# Patient Record
Sex: Female | Born: 1935 | Race: White | Hispanic: No | Smoking: Never smoker
Health system: Southern US, Community
[De-identification: ages and names within clinical notes are randomized; demographics above are authoritative.]

## PROBLEM LIST (undated history)

## (undated) DIAGNOSIS — I10 Essential (primary) hypertension: Secondary | ICD-10-CM

## (undated) DIAGNOSIS — I509 Heart failure, unspecified: Secondary | ICD-10-CM

## (undated) DIAGNOSIS — I350 Nonrheumatic aortic (valve) stenosis: Secondary | ICD-10-CM

## (undated) DIAGNOSIS — G629 Polyneuropathy, unspecified: Secondary | ICD-10-CM

## (undated) DIAGNOSIS — M1990 Unspecified osteoarthritis, unspecified site: Secondary | ICD-10-CM

## (undated) DIAGNOSIS — W19XXXA Unspecified fall, initial encounter: Secondary | ICD-10-CM

## (undated) DIAGNOSIS — Y92009 Unspecified place in unspecified non-institutional (private) residence as the place of occurrence of the external cause: Secondary | ICD-10-CM

## (undated) DIAGNOSIS — K219 Gastro-esophageal reflux disease without esophagitis: Secondary | ICD-10-CM

## (undated) DIAGNOSIS — Z87440 Personal history of urinary (tract) infections: Secondary | ICD-10-CM

## (undated) HISTORY — PX: TONSILLECTOMY: SUR1361

## (undated) HISTORY — PX: ABDOMINAL HYSTERECTOMY: SHX81

## (undated) HISTORY — PX: ROTATOR CUFF REPAIR: SHX139

## (undated) HISTORY — PX: EYE SURGERY: SHX253

## (undated) HISTORY — PX: COLON SURGERY: SHX602

## (undated) HISTORY — PX: CATARACT EXTRACTION: SUR2

## (undated) HISTORY — PX: FOOT SURGERY: SHX648

---

## 2004-07-23 ENCOUNTER — Ambulatory Visit: Payer: Self-pay | Admitting: Internal Medicine

## 2005-03-24 ENCOUNTER — Emergency Department: Payer: Self-pay | Admitting: Unknown Physician Specialty

## 2005-03-24 ENCOUNTER — Other Ambulatory Visit: Payer: Self-pay

## 2005-04-01 ENCOUNTER — Inpatient Hospital Stay: Payer: Self-pay | Admitting: Internal Medicine

## 2005-04-01 ENCOUNTER — Other Ambulatory Visit: Payer: Self-pay

## 2005-04-06 ENCOUNTER — Inpatient Hospital Stay: Payer: Self-pay | Admitting: Surgery

## 2005-04-06 ENCOUNTER — Other Ambulatory Visit: Payer: Self-pay

## 2005-04-09 ENCOUNTER — Other Ambulatory Visit: Payer: Self-pay

## 2005-05-28 ENCOUNTER — Ambulatory Visit: Payer: Self-pay | Admitting: Gastroenterology

## 2005-07-24 ENCOUNTER — Ambulatory Visit: Payer: Self-pay | Admitting: Internal Medicine

## 2006-02-19 ENCOUNTER — Ambulatory Visit: Payer: Self-pay | Admitting: Orthopaedic Surgery

## 2006-03-06 ENCOUNTER — Ambulatory Visit: Payer: Self-pay | Admitting: Internal Medicine

## 2006-03-13 ENCOUNTER — Inpatient Hospital Stay: Payer: Self-pay | Admitting: Orthopaedic Surgery

## 2006-11-27 ENCOUNTER — Ambulatory Visit: Payer: Self-pay | Admitting: Internal Medicine

## 2007-06-30 ENCOUNTER — Ambulatory Visit: Payer: Self-pay | Admitting: Gastroenterology

## 2008-01-12 ENCOUNTER — Ambulatory Visit: Payer: Self-pay | Admitting: Internal Medicine

## 2008-11-16 ENCOUNTER — Ambulatory Visit: Payer: Self-pay | Admitting: Cardiology

## 2009-01-31 ENCOUNTER — Ambulatory Visit: Payer: Self-pay | Admitting: Internal Medicine

## 2009-04-16 ENCOUNTER — Ambulatory Visit: Payer: Self-pay | Admitting: Internal Medicine

## 2009-04-21 ENCOUNTER — Inpatient Hospital Stay: Payer: Self-pay | Admitting: Internal Medicine

## 2009-05-17 ENCOUNTER — Ambulatory Visit: Payer: Self-pay | Admitting: Internal Medicine

## 2009-09-18 ENCOUNTER — Ambulatory Visit: Payer: Self-pay | Admitting: Internal Medicine

## 2010-02-20 ENCOUNTER — Ambulatory Visit: Payer: Self-pay | Admitting: Internal Medicine

## 2010-07-23 ENCOUNTER — Emergency Department: Payer: Self-pay | Admitting: Emergency Medicine

## 2012-06-02 LAB — URINALYSIS, COMPLETE
Bilirubin,UR: NEGATIVE
Glucose,UR: NEGATIVE mg/dL (ref 0–75)
Ketone: NEGATIVE
Ph: 6 (ref 4.5–8.0)
RBC,UR: 1 /HPF (ref 0–5)
Specific Gravity: 1.012 (ref 1.003–1.030)
Squamous Epithelial: 1
WBC UR: 4 /HPF (ref 0–5)

## 2012-06-02 LAB — COMPREHENSIVE METABOLIC PANEL
Albumin: 3.9 g/dL (ref 3.4–5.0)
Alkaline Phosphatase: 83 U/L (ref 50–136)
Anion Gap: 11 (ref 7–16)
Bilirubin,Total: 0.3 mg/dL (ref 0.2–1.0)
Calcium, Total: 8.5 mg/dL (ref 8.5–10.1)
Chloride: 105 mmol/L (ref 98–107)
Co2: 24 mmol/L (ref 21–32)
Creatinine: 0.69 mg/dL (ref 0.60–1.30)
EGFR (African American): >60
EGFR (Non-African Amer.): >60
SGOT(AST): 20 U/L (ref 15–37)
SGPT (ALT): 22 U/L (ref 12–78)
Sodium: 140 mmol/L (ref 136–145)

## 2012-06-02 LAB — CBC
HCT: 38.1 % (ref 35.0–47.0)
HGB: 12.5 g/dL (ref 12.0–16.0)
MCH: 24.3 pg — ABNORMAL LOW (ref 26.0–34.0)
MCV: 74 fL — ABNORMAL LOW (ref 80–100)
RBC: 5.15 10*6/uL (ref 3.80–5.20)
RDW: 15.6 % — ABNORMAL HIGH (ref 11.5–14.5)

## 2012-06-02 LAB — CK TOTAL AND CKMB (NOT AT ARMC)
CK, Total: 259 U/L — ABNORMAL HIGH (ref 21–215)
CK-MB: 13.5 ng/mL — ABNORMAL HIGH (ref 0.5–3.6)

## 2012-06-02 LAB — PROTIME-INR: INR: 1.1

## 2012-06-03 ENCOUNTER — Inpatient Hospital Stay: Payer: Self-pay | Admitting: Internal Medicine

## 2012-06-03 LAB — CK TOTAL AND CKMB (NOT AT ARMC)
CK, Total: 332 U/L — ABNORMAL HIGH (ref 21–215)
CK, Total: 309 U/L — ABNORMAL HIGH (ref 21–215)
CK-MB: 8.1 ng/mL — ABNORMAL HIGH (ref 0.5–3.6)
CK-MB: 6.7 ng/mL — ABNORMAL HIGH (ref 0.5–3.6)

## 2012-06-03 LAB — TROPONIN I
Troponin-I: 0.15 ng/mL — ABNORMAL HIGH
Troponin-I: 0.16 ng/mL — ABNORMAL HIGH

## 2012-06-03 LAB — IRON AND TIBC
Iron Bind.Cap.(Total): 380 ug/dL (ref 250–450)
Iron: 68 ug/dL (ref 50–170)

## 2012-06-03 LAB — DIGOXIN LEVEL: Digoxin: <0.1 ng/mL — ABNORMAL LOW

## 2012-06-04 LAB — CBC WITH DIFFERENTIAL/PLATELET
Basophil #: 0.2 10*3/uL — ABNORMAL HIGH (ref 0.0–0.1)
HCT: 37.1 % (ref 35.0–47.0)
HGB: 12 g/dL (ref 12.0–16.0)
Lymphocyte #: 2 10*3/uL (ref 1.0–3.6)
Lymphocyte %: 27.4 %
MCH: 24.4 pg — ABNORMAL LOW (ref 26.0–34.0)
MCHC: 32.4 g/dL (ref 32.0–36.0)
MCV: 75 fL — ABNORMAL LOW (ref 80–100)
Monocyte %: 11.2 %
Platelet: 115 10*3/uL — ABNORMAL LOW (ref 150–440)
RDW: 15.6 % — ABNORMAL HIGH (ref 11.5–14.5)
WBC: 7.4 10*3/uL (ref 3.6–11.0)

## 2012-06-04 LAB — LIPID PANEL
Cholesterol: 162 mg/dL (ref 0–200)
HDL Cholesterol: 29 mg/dL — ABNORMAL LOW (ref 40–60)
Triglycerides: 151 mg/dL (ref 0–200)

## 2012-06-04 LAB — BASIC METABOLIC PANEL
Calcium, Total: 8.7 mg/dL (ref 8.5–10.1)
Chloride: 108 mmol/L — ABNORMAL HIGH (ref 98–107)
Co2: 25 mmol/L (ref 21–32)
Creatinine: 0.75 mg/dL (ref 0.60–1.30)
EGFR (African American): >60
Sodium: 141 mmol/L (ref 136–145)

## 2012-06-04 LAB — MAGNESIUM: Magnesium: 1.9 mg/dL

## 2012-06-07 LAB — URINE CULTURE

## 2012-07-28 ENCOUNTER — Ambulatory Visit: Payer: Self-pay | Admitting: Internal Medicine

## 2012-08-03 ENCOUNTER — Ambulatory Visit: Payer: Self-pay | Admitting: Oncology

## 2012-08-04 ENCOUNTER — Ambulatory Visit: Payer: Self-pay | Admitting: Oncology

## 2012-08-16 ENCOUNTER — Ambulatory Visit: Payer: Self-pay | Admitting: Oncology

## 2013-01-15 ENCOUNTER — Ambulatory Visit: Payer: Self-pay | Admitting: Internal Medicine

## 2014-03-16 DIAGNOSIS — I5023 Acute on chronic systolic (congestive) heart failure: Secondary | ICD-10-CM

## 2014-03-16 DIAGNOSIS — I350 Nonrheumatic aortic (valve) stenosis: Secondary | ICD-10-CM | POA: Insufficient documentation

## 2014-03-16 DIAGNOSIS — I4891 Unspecified atrial fibrillation: Secondary | ICD-10-CM | POA: Insufficient documentation

## 2014-03-16 DIAGNOSIS — I2510 Atherosclerotic heart disease of native coronary artery without angina pectoris: Secondary | ICD-10-CM | POA: Insufficient documentation

## 2014-03-16 DIAGNOSIS — D649 Anemia, unspecified: Secondary | ICD-10-CM

## 2014-12-14 DIAGNOSIS — R0602 Shortness of breath: Secondary | ICD-10-CM | POA: Insufficient documentation

## 2015-01-03 NOTE — Consult Note (Signed)
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  Patient states that she was standing in her kitchen when she developed severe pain in her legs and knees followed by leg weakness, nausea  and then she fell.  It is unclear whether she lost consciousness or not.  She presented to the emergency room where head CT was unremarkable.  Carotid Dopplers were unremarkable.  She has a mild serum troponin elevation of 0.15 but denies any chest pain or shortness of breath.   She is limited quite a bit by pain in her legs.  Her falls have all been secondary to pain and leg weakness. EKG reveals left bundle branch block.  She has not been felt to be a candidate for chronic anticoagulations secondary to fall risk.  She is on aspirin for antiplatelet therapy.  She denies any chest pain or shortness of breath.  She and her daughter are interested in consideration for knee surgery to help with her  pain   Physical Exam:   GEN well developed, well nourished, no acute distress    HEENT PERRL    NECK supple    RESP clear BS    CARD Regular rate and rhythm  Murmur    Murmur Systolic    Systolic Murmur axilla    ABD denies tenderness  normal BS    NEURO cranial nerves intact, motor/sensory function intact    PSYCH A+O to time, place, person   Review of Systems:   Subjective/Chief Complaint leg and knee pain    General: No Complaints    Skin: No Complaints    ENT: No Complaints    Eyes: No Complaints    Neck: No Complaints    Respiratory: No Complaints    Cardiovascular: No Complaints    Gastrointestinal: No Complaints    Genitourinary: No Complaints    Vascular: No Complaints    Musculoskeletal: Muscle or joint pain    Neurologic:  Fainting    Hematologic: No Complaints    Endocrine: No Complaints    Psychiatric: No Complaints    Review of Systems: All other systems were reviewed and found to be negative    Medications/Allergies Reviewed Medications/Allergies reviewed        Admit Diagnosis:   SYNCOPE AND COLLAPSE: 03-Jun-2012, Active, SYNCOPE AND COLLAPSE      Admit Reason:   Diabetes: (250.00) Active, ICD9, DM w/o complication type II   Elevated troponin level: (790.6) Active, ICD9, Other abnormal blood chemistry   Syncope and collapse: (780.2) 03-Jun-2012, Active, ICD9, Syncope and collapse      DME/HH/HomeO2:   advanced home health: (3) 14-Mar-2006, Active, [facility], Home Oxygen  Home Medications: Medication Instructions Status  aspirin 81 mg oral enteric coated tablet   once a day  Active  Bystolic 10 mg tablet 1 tab(s) orally once a day x 30 days  Active  meloxicam 7.5 mg oral tablet 1 tab(s) orally once a day Active  glimepiride 2 mg oral tablet 1 tab(s) orally once a day Active  digoxin 125 mcg (0.125 mg) oral tablet 1 tab(s) orally once a day Active  cyanocobalamin 1000 microgram(s) intramuscular every 3 to 4 weeks Active  gabapentin 100 mg oral capsule 100 milligram(s) orally 3 times a day Active  Diltiazem Hydrochloride  CD 180 mg/24 hours oral capsule, extended release 1 cap(s) orally once a day Active  omeprazole 20 mg oral delayed release capsule 1 cap(s) orally once a day Active   EKG:   Abnormal LBBB    Codeine: GI Distress    Impression 81 year old female with history of atrial fibrillation with a chads score of 2 treated with rate control and antiplatelet therapy do to fall risk.  She also has a history of an 80% stenosis in a nondominant right coronary artery by cardiac catheterization in 2010.  She has a history of an cardiomyopathy felt to be ischemic in nature.  Her ejection fraction is approximately 40%.  She was admitted after a falling episode which occurred in the face of severe  lower extremity discomfort.  She is unclear whether she lost consciousness.  She has a mild serum troponin elevation but spent approximately 2-3 hours on the floor until she was found by her family.  Etiology of the troponin elevation does not appear to be in acute ischemic event.  He most likely has demand injury do to the stress of being on the floor.  Etiology of her syncope appears to have a vasovagal component given the fact she had severe pain with nausea at the time of the event.  She has had no significant arrhythmias since admission.    Plan 1.  Continue to follow on telemetry and to rule out for myocardial infarction 2.  Continue with current medications for rate control and anti-hypertension 3.   Would defer cardiac catheterization at this time given the fact her troponin elevation does not appear to be secondary to acute ischemic event and do to the anatomy noted by cardiac catheterization in March of 2010. 4.  Should the patient decide to pursue intervention underneath, functional study would need to be carried out.  This can be done as an outpatient  if needed. 5.  Would continue to refrain from use of anticoagulation therapy secondary to fall risk   Electronic Signatures: Teodoro Spray (MD)  (Signed 18-Sep-13 14:13)  Authored: General Aspect/Present Illness, History and Physical Exam, Review of System, Health Issues, Home Medications, EKG , Allergies, Impression/Plan   Last Updated: 18-Sep-13 14:13 by Teodoro Spray (MD)

## 2015-01-03 NOTE — H&P (Signed)
PATIENT NAME:  Jessica Blackwell, Jessica Blackwell MR#:  [id number] DATE OF BIRTH:  [birth year]  DATE OF ADMISSION:  06/03/2012  PRIMARY CARE PHYSICIAN:  [facility]  ER PHYSICIAN: Dr. Benjaman Lobe  ADMITTING PHYSICIAN: Dr. Pearletha Furl   PRESENTING COMPLAINT: Fall with altered mental status.   HISTORY OF PRESENT ILLNESS: The patient is an 81 year old lady who was in her usual state of health until this evening when she returned from grocery shopping. She was trying to put away the groceries in the fridge when she felt her legs buckle under her. At this time she slumped to the ground, hitting the back of her head on the floor. She did not recall any event again until she was being picked up by her son-in-law about one hour later. Denies any bleeding ENT, no deformity, no incontinence. She had some episodes of nausea but no vomiting. Denies any chest pain. No PND, orthopnea, or pedal edema. No palpitations. No recent change in medication. No recent long distance travel or sick contact. For this she was reviewed in the Emergency Room today and referred to the hospitalist. Work-up so far showed elevated troponin of 0.17 from her normal baseline. EKG showed normal sinus rhythm, rate of 77 with left bundle branch block. Urinalysis was positive for urinary tract infection. The patient denies any fever. No dysuria or frequency.   REVIEW OF SYSTEMS:  CONSTITUTIONAL: Admits to fatigue, weakness. No weight loss or weight gain. No fever. EYES: No blurred vision, redness, or discharge. ENT: No tinnitus, epistaxis, or difficulty swallowing. RESPIRATORY: No cough or wheezing. CARDIOVASCULAR: No chest pain, orthopnea, pedal edema, or palpitations. Positive for syncopal episode today. GI: Positive for nausea but no vomiting or diarrhea. No abdominal pain. No change in bowel habits. No dysuria or frequency. ENDOCRINE: No polyuria, polydipsia, or heat or cold intolerance. HEMATOLOGIC: No anemia, swollen glands, bleeding, or easy  bruising. SKIN: No rashes, change in hair or skin texture. MUSCULOSKELETAL: No joint pain, redness, swelling, or limited activity. NEURO: Denies numbness, tremors, vertigo, or ataxia, but admits to some headache since coming to the hospital. PSYCH: No anxiety or depression.   PAST MEDICAL HISTORY:  1. Type 2 diabetes.  2. History of ischemic cardiomyopathy with ejection fraction of 40% from 2001.  3. Coronary artery disease, distal RCA obstruction on medical management only.  4. History of peptic ulcer disease.  5. Hypertension.  6. Hyperlipidemia.  7. Osteoarthritis.  8. Mitral regurgitation.  9. Paroxysmal atrial fibrillation. 10. Congestive heart failure. 11. History of diverticulosis status post colon resection.  12. Total hysterectomy.  13. History of cardiac catheterization in March 2010 for a syncopal episode.   SOCIAL HISTORY: Lives alone. Family lives nearby. No alcohol, tobacco, or recreational drug use. Retired from [employer].   FAMILY HISTORY: Positive for coronary artery disease, diabetes, and CVA.   ALLERGIES: Codeine, which gives her GI upset.   MEDICATIONS:  1. Meloxicam 7.5 mg daily.  2. Digoxin 125 mcg daily.  3. Diltiazem CD 180 mg daily. 4. Gabapentin 100 mg 3 times daily.  5. Glimepiride 2 mg daily.  6. Bystolic 10 mg daily.  7. Omeprazole 20 mg daily.  8. Cyanocobalamin 1000 mcg IM every 3 to 4 weeks.  9. Aspirin 81 mg daily.   PHYSICAL EXAMINATION:  VITAL SIGNS: Temperature 96, pulse 89, respiratory rate 18, blood pressure 159/77 on arrival and now is 109/55, sats 93% on room air.   GENERAL: Elderly lady lying on a gurney, awake, alert, oriented to time, place,  and person, in no overt distress.   HEENT: Atraumatic, normocephalic. Pupils equal, reactive to light and accommodation. Extraocular movement intact. Mucous membranes pink, moist.   NECK: Supple. No JV distention.   CHEST: Good air entry. Clear to auscultation.   HEART: Regular rate and  rhythm. 3/6 murmur  mitral region .   ABDOMEN: Full, moves with respiration, nontender. Bowel sounds normoactive. No organomegaly.   EXTREMITIES: No edema. No clubbing. No deformity.   NEUROLOGICAL: No focal motor or sensory deficits.   PSYCH: Affect appropriate to situation.   LABORATORY, DIAGNOSTIC, AND RADIOLOGICAL DATA: EKG showed normal sinus rhythm, rate of 77, left bundle branch block, which is unchanged from previous one. Chest x-ray showed no obvious infiltrates. CT head showed no acute abnormality but shows chronic small vessel ischemic changes.   CBC: White count 11, hemoglobin 12, platelets 120, down from 145 from two years ago which is the last time she had it checked. MCV 74. Chemistry unremarkable with sodium 140, potassium 3.8, creatinine 0.6, BUN 19, glucose 153, calcium 8.5. CK 259, MB fraction of 13. Troponin is elevated at 0.17 from negative values from two years ago. Normal LFTs.  INR is 1.1. PT of 14. Urinalysis shows positive nitrites, trace leukocyte esterase, WBCs 4, bacteria is trace.   IMPRESSION:  1. Syncope, query cause with differential including arrhythmia, rule out ACS versus orthostatic changes.  2. History of coronary artery disease with distal RCA lesion on medical treatment with elevated troponin.  3. History of  cardiomyopathy with ejection fraction of 40% from 2011. 4. Type 2 diabetes, poorly controlled.  5. Hypertension, stable.  6. Peptic ulcer disease noted. 7. Osteoarthritis on NSAIDs. 8. Iron deficiency anemia, not otherwise specified.  9. Thrombocytopenia, not otherwise specified.  10. Urinary tract infection.  PLAN: Admit to general medical floor under observation status for serial cardiac enzymes, TSH, magnesium, fasting lipid profile on telemonitoring. Echocardiogram in a.m. Orthostatic vitals q. shift. Sliding scale insulin to augment her blood sugar control. Aspirin, p.r.n. nitroglycerin and  morphine. Check digoxin level.  GI prophylaxis  with Protonix. Deep vein thrombosis prophylaxis with Lovenox.   CODE STATUS: FULL CODE.   We will transfer to Dr. Doy Hutching' service in the morning. N.p.o. from midnight.   TOTAL PATIENT CARE TIME: 50 minutes.    ____________________________ Jules Husbands Pearletha Furl, MD mia:bjt D: 06/03/2012 00:26:23 ET T: 06/03/2012 07:16:54 ET JOB#: [id number]  cc: Payden Docter I. Pearletha Furl, MD, <Dictator> Leonie Douglas. Doy Hutching, MD Carola Frost MD ELECTRONICALLY SIGNED 06/04/2012 2:29

## 2015-03-17 ENCOUNTER — Other Ambulatory Visit: Payer: Self-pay | Admitting: Internal Medicine

## 2015-03-17 DIAGNOSIS — R270 Ataxia, unspecified: Secondary | ICD-10-CM

## 2015-03-29 ENCOUNTER — Ambulatory Visit
Admission: RE | Admit: 2015-03-29 | Discharge: 2015-03-29 | Disposition: A | Discharge status: Home / Self Care | Payer: Medicare Other | Source: Ambulatory Visit | Attending: Internal Medicine | Admitting: Internal Medicine

## 2015-03-29 DIAGNOSIS — G319 Degenerative disease of nervous system, unspecified: Secondary | ICD-10-CM | POA: Insufficient documentation

## 2015-03-29 DIAGNOSIS — R270 Ataxia, unspecified: Secondary | ICD-10-CM

## 2015-04-04 ENCOUNTER — Ambulatory Visit: Payer: Self-pay

## 2015-07-28 ENCOUNTER — Emergency Department (HOSPITAL_COMMUNITY): Payer: Medicare Other

## 2015-07-28 ENCOUNTER — Observation Stay (HOSPITAL_COMMUNITY): Payer: Medicare Other

## 2015-07-28 ENCOUNTER — Encounter (HOSPITAL_COMMUNITY): Payer: Self-pay

## 2015-07-28 ENCOUNTER — Inpatient Hospital Stay (HOSPITAL_COMMUNITY)
Admission: EM | Admit: 2015-07-28 | Discharge: 2015-08-02 | DRG: 291 | Disposition: A | Discharge status: Home Health | Payer: Medicare Other | Attending: Internal Medicine | Admitting: Internal Medicine

## 2015-07-28 DIAGNOSIS — J9601 Acute respiratory failure with hypoxia: Secondary | ICD-10-CM

## 2015-07-28 DIAGNOSIS — I429 Cardiomyopathy, unspecified: Secondary | ICD-10-CM | POA: Diagnosis present

## 2015-07-28 DIAGNOSIS — N179 Acute kidney failure, unspecified: Secondary | ICD-10-CM

## 2015-07-28 DIAGNOSIS — I5023 Acute on chronic systolic (congestive) heart failure: Secondary | ICD-10-CM

## 2015-07-28 DIAGNOSIS — I5021 Acute systolic (congestive) heart failure: Secondary | ICD-10-CM

## 2015-07-28 DIAGNOSIS — W19XXXA Unspecified fall, initial encounter: Secondary | ICD-10-CM | POA: Insufficient documentation

## 2015-07-28 DIAGNOSIS — D509 Iron deficiency anemia, unspecified: Secondary | ICD-10-CM | POA: Diagnosis present

## 2015-07-28 DIAGNOSIS — R0600 Dyspnea, unspecified: Secondary | ICD-10-CM | POA: Diagnosis not present

## 2015-07-28 DIAGNOSIS — I959 Hypotension, unspecified: Secondary | ICD-10-CM | POA: Diagnosis not present

## 2015-07-28 DIAGNOSIS — Z66 Do not resuscitate: Secondary | ICD-10-CM | POA: Diagnosis present

## 2015-07-28 DIAGNOSIS — Y92099 Unspecified place in other non-institutional residence as the place of occurrence of the external cause: Secondary | ICD-10-CM | POA: Diagnosis not present

## 2015-07-28 DIAGNOSIS — R55 Syncope and collapse: Secondary | ICD-10-CM

## 2015-07-28 DIAGNOSIS — S0631AA Contusion and laceration of right cerebrum with loss of consciousness status unknown, initial encounter: Secondary | ICD-10-CM | POA: Diagnosis present

## 2015-07-28 DIAGNOSIS — R269 Unspecified abnormalities of gait and mobility: Secondary | ICD-10-CM | POA: Diagnosis present

## 2015-07-28 DIAGNOSIS — S0101XA Laceration without foreign body of scalp, initial encounter: Secondary | ICD-10-CM | POA: Diagnosis present

## 2015-07-28 DIAGNOSIS — I110 Hypertensive heart disease with heart failure: Principal | ICD-10-CM | POA: Diagnosis present

## 2015-07-28 DIAGNOSIS — R0989 Other specified symptoms and signs involving the circulatory and respiratory systems: Secondary | ICD-10-CM | POA: Diagnosis present

## 2015-07-28 DIAGNOSIS — S06319A Contusion and laceration of right cerebrum with loss of consciousness of unspecified duration, initial encounter: Secondary | ICD-10-CM | POA: Diagnosis present

## 2015-07-28 DIAGNOSIS — Z7982 Long term (current) use of aspirin: Secondary | ICD-10-CM | POA: Diagnosis not present

## 2015-07-28 DIAGNOSIS — I509 Heart failure, unspecified: Secondary | ICD-10-CM | POA: Diagnosis not present

## 2015-07-28 DIAGNOSIS — R0602 Shortness of breath: Secondary | ICD-10-CM

## 2015-07-28 DIAGNOSIS — K219 Gastro-esophageal reflux disease without esophagitis: Secondary | ICD-10-CM | POA: Diagnosis present

## 2015-07-28 DIAGNOSIS — I481 Persistent atrial fibrillation: Secondary | ICD-10-CM | POA: Diagnosis present

## 2015-07-28 DIAGNOSIS — Z79899 Other long term (current) drug therapy: Secondary | ICD-10-CM | POA: Diagnosis not present

## 2015-07-28 DIAGNOSIS — I4891 Unspecified atrial fibrillation: Secondary | ICD-10-CM | POA: Insufficient documentation

## 2015-07-28 DIAGNOSIS — K311 Adult hypertrophic pyloric stenosis: Secondary | ICD-10-CM

## 2015-07-28 DIAGNOSIS — I10 Essential (primary) hypertension: Secondary | ICD-10-CM

## 2015-07-28 DIAGNOSIS — D649 Anemia, unspecified: Secondary | ICD-10-CM

## 2015-07-28 DIAGNOSIS — I4819 Other persistent atrial fibrillation: Secondary | ICD-10-CM | POA: Insufficient documentation

## 2015-07-28 DIAGNOSIS — I472 Ventricular tachycardia: Secondary | ICD-10-CM | POA: Diagnosis not present

## 2015-07-28 DIAGNOSIS — Y92009 Unspecified place in unspecified non-institutional (private) residence as the place of occurrence of the external cause: Secondary | ICD-10-CM

## 2015-07-28 DIAGNOSIS — R0902 Hypoxemia: Secondary | ICD-10-CM | POA: Diagnosis not present

## 2015-07-28 DIAGNOSIS — I4729 Other ventricular tachycardia: Secondary | ICD-10-CM | POA: Insufficient documentation

## 2015-07-28 DIAGNOSIS — S06310A Contusion and laceration of right cerebrum without loss of consciousness, initial encounter: Secondary | ICD-10-CM

## 2015-07-28 HISTORY — DX: Unspecified osteoarthritis, unspecified site: M19.90

## 2015-07-28 HISTORY — DX: Personal history of urinary (tract) infections: Z87.440

## 2015-07-28 HISTORY — DX: Polyneuropathy, unspecified: G62.9

## 2015-07-28 HISTORY — DX: Unspecified fall, initial encounter: W19.XXXA

## 2015-07-28 HISTORY — DX: Gastro-esophageal reflux disease without esophagitis: K21.9

## 2015-07-28 HISTORY — DX: Essential (primary) hypertension: I10

## 2015-07-28 HISTORY — DX: Heart failure, unspecified: I50.9

## 2015-07-28 HISTORY — DX: Unspecified place in unspecified non-institutional (private) residence as the place of occurrence of the external cause: Y92.009

## 2015-07-28 LAB — BASIC METABOLIC PANEL
ANION GAP: 8 (ref 5–15)
BUN: 15 mg/dL (ref 6–20)
CHLORIDE: 106 mmol/L (ref 101–111)
CO2: 26 mmol/L (ref 22–32)
Calcium: 8.9 mg/dL (ref 8.9–10.3)
Creatinine, Ser: 0.57 mg/dL (ref 0.44–1.00)
GFR calc non Af Amer: >60 mL/min (ref >60)
Glucose, Bld: 147 mg/dL — ABNORMAL HIGH (ref 65–99)
POTASSIUM: 4.6 mmol/L (ref 3.5–5.1)
Sodium: 140 mmol/L (ref 135–145)

## 2015-07-28 LAB — VITAMIN B12

## 2015-07-28 LAB — CBC WITH DIFFERENTIAL/PLATELET
BASOS PCT: 1 %
Basophils Absolute: 0.1 10*3/uL (ref 0.0–0.1)
EOS PCT: 1 %
Eosinophils Absolute: 0.1 10*3/uL (ref 0.0–0.7)
HCT: 32.6 % — ABNORMAL LOW (ref 36.0–46.0)
HEMOGLOBIN: 9.5 g/dL — AB (ref 12.0–15.0)
LYMPHS PCT: 18 %
Lymphs Abs: 1.6 10*3/uL (ref 0.7–4.0)
MCH: 18.9 pg — AB (ref 26.0–34.0)
MCHC: 29.1 g/dL — ABNORMAL LOW (ref 30.0–36.0)
MCV: 64.8 fL — ABNORMAL LOW (ref 78.0–100.0)
MONOS PCT: 8 %
Monocytes Absolute: 0.7 10*3/uL (ref 0.1–1.0)
NEUTROS PCT: 72 %
Neutro Abs: 6.5 10*3/uL (ref 1.7–7.7)
Platelets: 164 10*3/uL (ref 150–400)
RBC: 5.03 MIL/uL (ref 3.87–5.11)
RDW: 19.5 % — ABNORMAL HIGH (ref 11.5–15.5)
WBC: 9 10*3/uL (ref 4.0–10.5)

## 2015-07-28 LAB — HEMOGLOBIN AND HEMATOCRIT, BLOOD
HEMATOCRIT: 32.6 % — AB (ref 36.0–46.0)
HEMOGLOBIN: 9.5 g/dL — AB (ref 12.0–15.0)

## 2015-07-28 LAB — URINALYSIS, ROUTINE W REFLEX MICROSCOPIC
BILIRUBIN URINE: NEGATIVE
Glucose, UA: NEGATIVE mg/dL
Hgb urine dipstick: NEGATIVE
Ketones, ur: NEGATIVE mg/dL
LEUKOCYTES UA: NEGATIVE
NITRITE: NEGATIVE
PH: 6 (ref 5.0–8.0)
Protein, ur: NEGATIVE mg/dL
SPECIFIC GRAVITY, URINE: 1.008 (ref 1.005–1.030)
UROBILINOGEN UA: 0.2 mg/dL (ref 0.0–1.0)

## 2015-07-28 LAB — MAGNESIUM: Magnesium: 2 mg/dL (ref 1.7–2.4)

## 2015-07-28 LAB — BRAIN NATRIURETIC PEPTIDE: B NATRIURETIC PEPTIDE 5: 466.5 pg/mL — AB (ref 0.0–100.0)

## 2015-07-28 LAB — TROPONIN I: Troponin I: 0.03 ng/mL (ref <0.031)

## 2015-07-28 MED ORDER — ENOXAPARIN SODIUM 40 MG/0.4ML ~~LOC~~ SOLN
40.0000 mg | SUBCUTANEOUS | Status: DC
  Administered 2015-07-28 – 2015-07-31 (×4): 40 mg via SUBCUTANEOUS
  Filled 2015-07-28 (×4): qty 0.4

## 2015-07-28 MED ORDER — NAPROXEN 500 MG PO TABS
500.0000 mg | ORAL_TABLET | Freq: Every day | ORAL | Status: DC
  Filled 2015-07-28: qty 1

## 2015-07-28 MED ORDER — ASPIRIN EC 81 MG PO TBEC
81.0000 mg | DELAYED_RELEASE_TABLET | Freq: Every day | ORAL | Status: DC
  Administered 2015-07-28 – 2015-08-02 (×6): 81 mg via ORAL
  Filled 2015-07-28 (×6): qty 1

## 2015-07-28 MED ORDER — GABAPENTIN 300 MG PO CAPS
300.0000 mg | ORAL_CAPSULE | Freq: Three times a day (TID) | ORAL | Status: DC
  Administered 2015-07-28 – 2015-08-02 (×15): 300 mg via ORAL
  Filled 2015-07-28 (×16): qty 1

## 2015-07-28 MED ORDER — SODIUM CHLORIDE 0.9 % IJ SOLN
3.0000 mL | Freq: Two times a day (BID) | INTRAMUSCULAR | Status: DC
  Administered 2015-07-28 – 2015-07-29 (×3): 3 mL via INTRAVENOUS
  Administered 2015-07-29: 10:00:00 via INTRAVENOUS
  Administered 2015-07-30 – 2015-08-02 (×7): 3 mL via INTRAVENOUS

## 2015-07-28 MED ORDER — FUROSEMIDE 10 MG/ML IJ SOLN
40.0000 mg | Freq: Once | INTRAMUSCULAR | Status: AC
  Administered 2015-07-28: 40 mg via INTRAVENOUS
  Filled 2015-07-28: qty 4

## 2015-07-28 MED ORDER — FUROSEMIDE 10 MG/ML IJ SOLN
40.0000 mg | Freq: Every day | INTRAMUSCULAR | Status: DC
  Administered 2015-07-28 – 2015-07-30 (×3): 40 mg via INTRAVENOUS
  Filled 2015-07-28 (×3): qty 4

## 2015-07-28 MED ORDER — LIDOCAINE-EPINEPHRINE (PF) 2 %-1:200000 IJ SOLN
20.0000 mL | Freq: Once | INTRAMUSCULAR | Status: AC
  Administered 2015-07-28: 20 mL
  Filled 2015-07-28: qty 20

## 2015-07-28 MED ORDER — METOPROLOL SUCCINATE ER 25 MG PO TB24
25.0000 mg | ORAL_TABLET | Freq: Every day | ORAL | Status: DC
  Administered 2015-07-28 – 2015-07-31 (×4): 25 mg via ORAL
  Filled 2015-07-28 (×4): qty 1

## 2015-07-28 MED ORDER — TRAMADOL HCL 50 MG PO TABS
50.0000 mg | ORAL_TABLET | Freq: Four times a day (QID) | ORAL | Status: DC | PRN
  Administered 2015-07-31: 50 mg via ORAL
  Filled 2015-07-28 (×4): qty 1

## 2015-07-28 MED ORDER — PANTOPRAZOLE SODIUM 40 MG PO TBEC
40.0000 mg | DELAYED_RELEASE_TABLET | Freq: Every day | ORAL | Status: DC
  Administered 2015-07-28 – 2015-08-02 (×6): 40 mg via ORAL
  Filled 2015-07-28 (×6): qty 1

## 2015-07-28 MED ORDER — FERROUS SULFATE 325 (65 FE) MG PO TABS
325.0000 mg | ORAL_TABLET | Freq: Three times a day (TID) | ORAL | Status: DC
  Administered 2015-07-28 – 2015-07-29 (×5): 325 mg via ORAL
  Filled 2015-07-28 (×5): qty 1

## 2015-07-28 NOTE — Progress Notes (Signed)
Patient transferred from the ED via stretcher to room 3E13. Called and notified CMT to verify placement of telemetry leads. Oriented and educated patient to the room and the heart failure floor. Currently patient complains of no pain or discomfort. Will continue to monitor patient to end of shift.

## 2015-07-28 NOTE — Care Management Obs Status (Signed)
[facility] NOTIFICATION   Patient Details  Name: Jessica Blackwell MRN: [id number] Date of Birth: 07-25-1925   Medicare Observation Status Notification Given:       Royston Bake, RN 07/28/2015, 2:08 PM

## 2015-07-28 NOTE — H&P (Addendum)
[facility] History and Physical  Jessica Blackwell [id number] DOB: 1925-09-07 DOA: 07/28/2015  Referring physician: ED PCP: Idelle Crouch, MD   Chief Complaint: Fall  HPI:  Patient has a past medical history of congestive heart failure, hypertension, GERD, and gait disturbance; who presents after having a fall tonight around 10:30 PM. Patient notes that she was trying to get a bowl of oatmeal and fell backwards hitting her head. Patient never lost consciousness she is able to use her life alert button and to call for help. She notes that she had her walker beside her. Patient patient about her use of tramadol and she states that she last taken and 9 PM that night.That she may have taken 3 tramadol pills total for the day. She suffered a laceration to her head for which she was put on the floor. EMS brought her to the emergency department and she required 3 stitches to the parietal lobe. CT scans were negative for any intracranial abnormalities. However, patient was seen to have decreased oxygen saturations into the 80s on room air which prompted a chest x-ray which showed cardiomegaly and pulmonary congestion. Further workup revealed a BNP of 466. Patient was given 40 mg of IV Lasix and recommended to be admitted for at least observation. Patient states that 2 weeks ago she went to see her PCP and had noted that she was short of breath with exertion. A chest x-ray was taken and she has a follow-up appointment on 08/01/2015 with her PCP to have a repeat chest x-ray. Patient notes that she has a cardiologist and previously had been on Lasix for short period in time but that was discontinued-therapist told her that she no longer required it. Patient does not regularly check her weight, but feels that it stayed about the same.  Review of Systems  Constitutional: Negative for chills and weight loss.  HENT: Positive for hearing loss.   Respiratory: Positive for shortness of breath.    Genitourinary: Negative for dysuria and frequency.  Musculoskeletal: Positive for falls.  Skin: Negative for itching and rash.  Neurological: Positive for headaches. Negative for tingling, focal weakness, seizures and weakness.  Psychiatric/Behavioral: Negative for suicidal ideas and hallucinations.       Past Medical History  Diagnosis Date  . Hypertension   . GERD (gastroesophageal reflux disease)      Past Surgical History  Procedure Laterality Date  . Colon surgery        Social History:  reports that she has never smoked. She does not have any smokeless tobacco history on file. She reports that she does not drink alcohol. Her drug history is not on file. Where does patient live--home  and with whom if at home? Alone Can patient participate in ADLs? Yes  No Known Allergies  No family history on file.    Prior to Admission medications   Medication Sig Start Date End Date Taking? Authorizing Provider  aspirin EC 81 MG tablet Take 81 mg by mouth daily.   Yes Historical Provider, MD  gabapentin (NEURONTIN) 300 MG capsule Take 300 mg by mouth 3 (three) times daily.   Yes Historical Provider, MD  metoprolol succinate (TOPROL-XL) 25 MG 24 hr tablet Take 25 mg by mouth daily.   Yes Historical Provider, MD  naproxen (NAPROSYN) 500 MG tablet Take 500 mg by mouth daily.   Yes Historical Provider, MD  omeprazole (PRILOSEC) 20 MG capsule Take 20 mg by mouth 2 (two) times daily before a meal.  Yes Historical Provider, MD  traMADol (ULTRAM) 50 MG tablet Take 50 mg by mouth every 6 (six) hours as needed for moderate pain.   Yes Historical Provider, MD     Physical Exam: Filed Vitals:   07/28/15 0515 07/28/15 0530 07/28/15 0545 07/28/15 0624  BP: 132/85 122/57 131/68 137/66  Pulse: 79 79 76 76  Temp:    98 F (36.7 C)  TempSrc:    Oral  Resp: 17 18 17 24   Height:    5\' 2"  (1.575 m)  Weight:    66.86 kg (147 lb 6.4 oz)  SpO2: 99% 94% 94% 99%    Constitutional: Vital  signs reviewed. Patient is a well-developed and well-nourished in no acute distress and cooperative with exam. Alert and oriented x3.  Head: Normocephalic, laceration to the left parietal scalp with 3 staples present not bleeding  Ear: TM normal bilaterally  Mouth: no erythema or exudates, MMM  Eyes: PERRL, EOMI, conjunctivae normal, No scleral icterus.  Neck: Supple, Trachea midline normal ROM, No JVD, mass, thyromegaly, or carotid bruit present.  Cardiovascular: RRR, S1 normal, S2 normal, +2 out of 6 SEM, pulses symmetric and intact bilaterally  Pulmonary/Chest: Scattered bilateral crackles, no wheezes, rales, or rhonchi  Abdominal: Soft. Non-tender, non-distended, bowel sounds are normal, no masses, organomegaly, or guarding present.  GU: no CVA tenderness Musculoskeletal: No joint deformities, erythema, or stiffness, ROM full and no nontender Ext: no edema and no cyanosis, pulses palpable bilaterally (DP and PT)  Hematology: no cervical, inginal, or axillary adenopathy.  Neurological: A&O x3, Strenght is normal and symmetric bilaterally, cranial nerve II-XII are grossly intact, no focal motor deficit, sensory intact to light touch bilaterally.  Skin: Warm, dry and intact. No rash, cyanosis, or clubbing.  Psychiatric: Normal mood and affect. speech and behavior is normal. Judgment and thought content normal. Cognition and memory are normal.      Data Review   Micro Results No results found for this or any previous visit (from the past 240 hour(s)).  Radiology Reports Dg Chest 2 View  07/28/2015  CLINICAL DATA:  Hypoxia EXAM: CHEST  2 VIEW COMPARISON:  01/15/2013 chest CT. 03/16/2014 chest x-ray not available FINDINGS: Chronic cardiomegaly and marked pulmonary artery enlargement. Diffuse interstitial coarsening without Kerley lines or pleural effusions. There is a 4 cm mass with left lateral indistinct appearance, correlating with pleural based mass 01/15/2013 by chest CT. This mass has  shown indolent growth by CT since 2010, but is grossly stable compared to previous CT. 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 No pneumonia. No pneumothorax. IMPRESSION: 1. Cardiomegaly, pulmonary hypertension, and pulmonary venous congestion. 2. 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 Electronically Signed   By: Monte Fantasia M.D.   On: 07/28/2015 03:14   Ct Head Wo Contrast  07/28/2015  CLINICAL DATA:  Fall with head injury and laceration EXAM: CT HEAD WITHOUT CONTRAST CT CERVICAL SPINE WITHOUT CONTRAST TECHNIQUE: Multidetector CT imaging of the head and cervical spine was performed following the standard protocol without intravenous contrast. Multiplanar CT image reconstructions of the cervical spine were also generated. COMPARISON:  03/29/2015 head CT FINDINGS: Scout image shows a left pulmonary nodule which correlates with pleural based mass on 01/15/2013 chest CT. No gross enlargement. CT HEAD FINDINGS Skull and Sinuses:High posterior scalp laceration without calvarial fracture. Orbits: Soft tissue nodule in the anterior and nasal left orbit which is stable from prior and has been present since at least 2010, consistent with benign  process. Morphology favors varix, although location is somewhat unusual. Bilateral cataract resection. Brain: No evidence of acute infarction, hemorrhage, hydrocephalus, or mass lesion/mass effect. Brain atrophy, asymmetrically advanced at the cerebellum and brainstem. Typical for age chronic small vessel ischemic change in the deep cerebral white matter. CT CERVICAL SPINE FINDINGS Negative for acute fracture or traumatic malalignment (C3-4 slight anterolisthesis appears degenerative). No prevertebral edema. No gross cervical canal hematoma. C4-5 and C5-6 degenerative disc disease with disc osteophyte complexes contacting the ventral cord. Diffuse uncovertebral spurring with multilevel foraminal stenosis, greatest at C4-5, C5-6, and C6-7.  Imaging of the upper chest shows chronic enlargement of the main pulmonary artery consistent with pulmonary hypertension. Visible portion measures 42 mm in diameter. There is also multi nodular enlargement of the thyroid gland with dominant nodule in the lower left pole measuring up to 4 cm, size stable from 01/15/2013 chest CT. IMPRESSION: 1. No evidence of acute intracranial or cervical spine injury. 2. Scalp laceration without calvarial fracture. 3. Multiple chronic incidental findings described above. Electronically Signed   By: Monte Fantasia M.D.   On: 07/28/2015 02:05   Ct Cervical Spine Wo Contrast  07/28/2015  CLINICAL DATA:  Fall with head injury and laceration EXAM: CT HEAD WITHOUT CONTRAST CT CERVICAL SPINE WITHOUT CONTRAST TECHNIQUE: Multidetector CT imaging of the head and cervical spine was performed following the standard protocol without intravenous contrast. Multiplanar CT image reconstructions of the cervical spine were also generated. COMPARISON:  03/29/2015 head CT FINDINGS: Scout image shows a left pulmonary nodule which correlates with pleural based mass on 01/15/2013 chest CT. No gross enlargement. CT HEAD FINDINGS Skull and Sinuses:High posterior scalp laceration without calvarial fracture. Orbits: Soft tissue nodule in the anterior and nasal left orbit which is stable from prior and has been present since at least 2010, consistent with benign process. Morphology favors varix, although location is somewhat unusual. Bilateral cataract resection. Brain: No evidence of acute infarction, hemorrhage, hydrocephalus, or mass lesion/mass effect. Brain atrophy, asymmetrically advanced at the cerebellum and brainstem. Typical for age chronic small vessel ischemic change in the deep cerebral white matter. CT CERVICAL SPINE FINDINGS Negative for acute fracture or traumatic malalignment (C3-4 slight anterolisthesis appears degenerative). No prevertebral edema. No gross cervical canal hematoma. C4-5  and C5-6 degenerative disc disease with disc osteophyte complexes contacting the ventral cord. Diffuse uncovertebral spurring with multilevel foraminal stenosis, greatest at C4-5, C5-6, and C6-7. Imaging of the upper chest shows chronic enlargement of the main pulmonary artery consistent with pulmonary hypertension. Visible portion measures 42 mm in diameter. There is also multi nodular enlargement of the thyroid gland with dominant nodule in the lower left pole measuring up to 4 cm, size stable from 01/15/2013 chest CT. IMPRESSION: 1. No evidence of acute intracranial or cervical spine injury. 2. Scalp laceration without calvarial fracture. 3. Multiple chronic incidental findings described above. Electronically Signed   By: Monte Fantasia M.D.   On: 07/28/2015 02:05     CBC  Recent Labs Lab 07/28/15 0328  WBC 9.0  HGB 9.5*  HCT 32.6*  PLT 164  MCV 64.8*  MCH 18.9*  MCHC 29.1*  RDW 19.5*  LYMPHSABS 1.6  MONOABS 0.7  EOSABS 0.1  BASOSABS 0.1    Chemistries   Recent Labs Lab 07/28/15 0328  NA 140  K 4.6  CL 106  CO2 26  GLUCOSE 147*  BUN 15  CREATININE 0.57  CALCIUM 8.9   ------------------------------------------------------------------------------------------------------------------ estimated creatinine clearance is 43.6 mL/min (by C-G formula based on  Cr of 0.57). ------------------------------------------------------------------------------------------------------------------ No results for input(s): HGBA1C in the last 72 hours. ------------------------------------------------------------------------------------------------------------------ No results for input(s): CHOL, HDL, LDLCALC, TRIG, CHOLHDL, LDLDIRECT in the last 72 hours. ------------------------------------------------------------------------------------------------------------------ No results for input(s): TSH, T4TOTAL, T3FREE, THYROIDAB in the last 72 hours.  Invalid input(s):  FREET3 ------------------------------------------------------------------------------------------------------------------ No results for input(s): VITAMINB12, FOLATE, FERRITIN, TIBC, IRON, RETICCTPCT in the last 72 hours.  Coagulation profile No results for input(s): INR, PROTIME in the last 168 hours.  No results for input(s): DDIMER in the last 72 hours.  Cardiac Enzymes  Recent Labs Lab 07/28/15 0328  TROPONINI 0.03   ------------------------------------------------------------------------------------------------------------------ Invalid input(s): POCBNP   CBG: No results for input(s): GLUCAP in the last 168 hours.     EKG: Independently reviewed. Sinus rhythm with a LBBB  Assessment/Plan   Fall at home: Acute. Patient falling at home suffering a scalp laceration. CT negative for any acute intracranial abnormality. At baseline patient has a gait disturbance and ambulates with a walker. -Physical therapy to eval and treat -Up with assistance  Laceration of left parietal lobe: Acute, secondary to fall. Patient received 3 staples all in the ED - have staples removed as instructed by the emergency room physician    CHF (congestive heart failure): Known history of for which patient has a cardiologist. Previously not requiring any Lasix therapy. BNP 466. Chest x-ray showing pulmonary congestion. Patient given IV 40mg  Lasix 1. - Strict ins and outs -Repeat chest x-ray -Determine if patient needs another dose of Lasix   Hypoxia on room air: Acute secondary to above. Patient not on nasal cannula oxygen at home. -Nasal cannula oxygen were weaned to off as long as O2sats  greater than 92%    HTN: stable -Continue metoprolol, aspirin  Gerd -protonix  Anemia chronic. Patient's H&H are 9.5 and 32.6 - recheck H&H  Code Status:   full Family Communication: bedside Disposition Plan: admit   Total time spent 55 minutes.Greater than 50% of this time was spent in  counseling, explanation of diagnosis, planning of further management, and coordination of care  [facility] Pager [phone]  If 7PM-7AM, please contact night-coverage [email or web address] Password [facility] 07/28/2015, 6:40 AM

## 2015-07-28 NOTE — Progress Notes (Signed)
PT Cancellation Note  Patient Details Name: AMIEL SPEERS MRN: [id number] DOB: [birth year]   Cancelled Treatment:    Reason Eval/Treat Not Completed: Patient declined, no reason specified (daughter and she are going to discuss the benefits of PT ).  Try again tomorrow   Ramond Dial 07/28/2015, 1:35 PM  Mee Hives, PT MS Acute Rehab Dept. Number: [facility] [id number] and [place] [phone]

## 2015-07-28 NOTE — Progress Notes (Signed)
In regards to 8 beats of V-tach orders given for labs to be drawn -magnesium.

## 2015-07-28 NOTE — ED Notes (Signed)
Suture Cart placed at bedside 

## 2015-07-28 NOTE — ED Notes (Signed)
PA in room to place sutures

## 2015-07-28 NOTE — Progress Notes (Signed)
Family at the bedside. Had questions about lab results and chest x-ray results. Family concerned about if the fluid is moving in the right direction. Explained to patient and family member that the chest xray looked a little better than the last and that she had received a dose of lasix earlier this morning on day shift. Currently patient complains of no complaints at this time. Will continue to monitor patient to end of shift.

## 2015-07-28 NOTE — ED Provider Notes (Signed)
81 year old female had a fall at home with laceration to scalp. There is no loss of consciousness and she is behaving normally. CT of head and cervical spine were negative for acute injury. Staples were placed in the laceration. However, she was noted to have periods of hypoxia with oxygen saturation dropping down into the mid 80s. Family member state that she has had oxygen desaturation when in the hospital before. Recent chest x-ray was reported to have shown congestive heart failure. She does not have overt signs of heart failure currently, but she will be sent for chest x-ray.  Chest x-ray is consistent with congestive heart failure. I have reviewed her records on care everywhere and recent chest x-ray also showed congestive heart failure. She is given a dose of furosemide and screening labs obtained. Hemoglobin is noted to be low at 9.5. Review of results on care everywhere to show recent hemoglobin of 9.6. However, there has been a drop from earlier in the year at which time hemoglobin was 11. PCP had checked iron studies and found her iron level to be low. BNP is mildly elevated. She did have some urinary output following furosemide, but continued to desaturate and taken off of nasal oxygen. She will need to be admitted for management of her congestive heart failure. Case has been discussed with Dr. Tamala Julian of [facility] who agrees to admit the patient.  Medical screening examination/treatment/procedure(s) were conducted as a shared visit with non-physician practitioner(s) and myself.  I personally evaluated the patient during the encounter.  Results for orders placed or performed during the hospital encounter of [id number]  Basic metabolic panel  Result Value Ref Range   Sodium 140 135 - 145 mmol/L   Potassium 4.6 3.5 - 5.1 mmol/L   Chloride 106 101 - 111 mmol/L   CO2 26 22 - 32 mmol/L   Glucose, Bld 147 (H) 65 - 99 mg/dL   BUN 15 6 - 20 mg/dL   Creatinine, Ser 0.57 0.44 - 1.00 mg/dL    Calcium 8.9 8.9 - 10.3 mg/dL   GFR calc non Af Amer >60 >60 mL/min   GFR calc Af Amer >60 >60 mL/min   Anion gap 8 5 - 15  CBC with Differential  Result Value Ref Range   WBC 9.0 4.0 - 10.5 K/uL   RBC 5.03 3.87 - 5.11 MIL/uL   Hemoglobin 9.5 (L) 12.0 - 15.0 g/dL   HCT 32.6 (L) 36.0 - 46.0 %   MCV 64.8 (L) 78.0 - 100.0 fL   MCH 18.9 (L) 26.0 - 34.0 pg   MCHC 29.1 (L) 30.0 - 36.0 g/dL   RDW 19.5 (H) 11.5 - 15.5 %   Platelets 164 150 - 400 K/uL   Neutrophils Relative % 72 %   Lymphocytes Relative 18 %   Monocytes Relative 8 %   Eosinophils Relative 1 %   Basophils Relative 1 %   Neutro Abs 6.5 1.7 - 7.7 K/uL   Lymphs Abs 1.6 0.7 - 4.0 K/uL   Monocytes Absolute 0.7 0.1 - 1.0 K/uL   Eosinophils Absolute 0.1 0.0 - 0.7 K/uL   Basophils Absolute 0.1 0.0 - 0.1 K/uL   RBC Morphology POLYCHROMASIA PRESENT   Brain natriuretic peptide  Result Value Ref Range   B Natriuretic Peptide 466.5 (H) 0.0 - 100.0 pg/mL  Troponin I  Result Value Ref Range   Troponin I 0.03 <0.031 ng/mL   Dg Chest 2 View  07/28/2015  CLINICAL DATA:  Hypoxia EXAM: CHEST  2 VIEW COMPARISON:  01/15/2013 chest CT. 03/16/2014 chest x-ray not available FINDINGS: Chronic cardiomegaly and marked pulmonary artery enlargement. Diffuse interstitial coarsening without Kerley lines or pleural effusions. There is a 4 cm mass with left lateral indistinct appearance, correlating with pleural based mass 01/15/2013 by chest CT. This mass has shown indolent growth by CT since 2010, but is grossly stable compared to previous CT. 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 No pneumonia. No pneumothorax. IMPRESSION: 1. Cardiomegaly, pulmonary hypertension, and pulmonary venous congestion. 2. 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 Electronically Signed   By: Monte Fantasia M.D.   On: 07/28/2015 03:14   Ct Head Wo Contrast  07/28/2015  CLINICAL DATA:  Fall with head injury and laceration EXAM: CT HEAD WITHOUT  CONTRAST CT CERVICAL SPINE WITHOUT CONTRAST TECHNIQUE: Multidetector CT imaging of the head and cervical spine was performed following the standard protocol without intravenous contrast. Multiplanar CT image reconstructions of the cervical spine were also generated. COMPARISON:  03/29/2015 head CT FINDINGS: Scout image shows a left pulmonary nodule which correlates with pleural based mass on 01/15/2013 chest CT. No gross enlargement. CT HEAD FINDINGS Skull and Sinuses:High posterior scalp laceration without calvarial fracture. Orbits: Soft tissue nodule in the anterior and nasal left orbit which is stable from prior and has been present since at least 2010, consistent with benign process. Morphology favors varix, although location is somewhat unusual. Bilateral cataract resection. Brain: No evidence of acute infarction, hemorrhage, hydrocephalus, or mass lesion/mass effect. Brain atrophy, asymmetrically advanced at the cerebellum and brainstem. Typical for age chronic small vessel ischemic change in the deep cerebral white matter. CT CERVICAL SPINE FINDINGS Negative for acute fracture or traumatic malalignment (C3-4 slight anterolisthesis appears degenerative). No prevertebral edema. No gross cervical canal hematoma. C4-5 and C5-6 degenerative disc disease with disc osteophyte complexes contacting the ventral cord. Diffuse uncovertebral spurring with multilevel foraminal stenosis, greatest at C4-5, C5-6, and C6-7. Imaging of the upper chest shows chronic enlargement of the main pulmonary artery consistent with pulmonary hypertension. Visible portion measures 42 mm in diameter. There is also multi nodular enlargement of the thyroid gland with dominant nodule in the lower left pole measuring up to 4 cm, size stable from 01/15/2013 chest CT. IMPRESSION: 1. No evidence of acute intracranial or cervical spine injury. 2. Scalp laceration without calvarial fracture. 3. Multiple chronic incidental findings described above.  Electronically Signed   By: Monte Fantasia M.D.   On: 07/28/2015 02:05   Ct Cervical Spine Wo Contrast  07/28/2015  CLINICAL DATA:  Fall with head injury and laceration EXAM: CT HEAD WITHOUT CONTRAST CT CERVICAL SPINE WITHOUT CONTRAST TECHNIQUE: Multidetector CT imaging of the head and cervical spine was performed following the standard protocol without intravenous contrast. Multiplanar CT image reconstructions of the cervical spine were also generated. COMPARISON:  03/29/2015 head CT FINDINGS: Scout image shows a left pulmonary nodule which correlates with pleural based mass on 01/15/2013 chest CT. No gross enlargement. CT HEAD FINDINGS Skull and Sinuses:High posterior scalp laceration without calvarial fracture. Orbits: Soft tissue nodule in the anterior and nasal left orbit which is stable from prior and has been present since at least 2010, consistent with benign process. Morphology favors varix, although location is somewhat unusual. Bilateral cataract resection. Brain: No evidence of acute infarction, hemorrhage, hydrocephalus, or mass lesion/mass effect. Brain atrophy, asymmetrically advanced at the cerebellum and brainstem. Typical for age chronic small vessel ischemic change in the deep cerebral white matter. CT CERVICAL  SPINE FINDINGS Negative for acute fracture or traumatic malalignment (C3-4 slight anterolisthesis appears degenerative). No prevertebral edema. No gross cervical canal hematoma. C4-5 and C5-6 degenerative disc disease with disc osteophyte complexes contacting the ventral cord. Diffuse uncovertebral spurring with multilevel foraminal stenosis, greatest at C4-5, C5-6, and C6-7. Imaging of the upper chest shows chronic enlargement of the main pulmonary artery consistent with pulmonary hypertension. Visible portion measures 42 mm in diameter. There is also multi nodular enlargement of the thyroid gland with dominant nodule in the lower left pole measuring up to 4 cm, size stable from  01/15/2013 chest CT. IMPRESSION: 1. No evidence of acute intracranial or cervical spine injury. 2. Scalp laceration without calvarial fracture. 3. Multiple chronic incidental findings described above. Electronically Signed   By: Monte Fantasia M.D.   On: 07/28/2015 02:05    Images viewed by me.    EKG Interpretation   Date/Time:  Friday July 28 2015 03:35:23 EST Ventricular Rate:  74 PR Interval:  132 QRS Duration: 150 QT Interval:  438 QTC Calculation: 486 R Axis:   -61 Text Interpretation:  Sinus rhythm Left bundle branch block When compared  with ECG of 06/02/2012, No significant change was found Confirmed by [facility]   MD, Mayukha Symmonds ([id number]) on 07/28/2015 3:46:07 AM        Delora Fuel, MD [id number]

## 2015-07-28 NOTE — ED Notes (Signed)
Dr Glick in room. 

## 2015-07-28 NOTE — Progress Notes (Signed)
Notified by CMT of patient having eight beats of V-Tach. Went in patient's room and patient was talking to a family member on the phone. Patient states she feels fine with no chest pain or pressure, or chest discomfort. Text paged hospitalist and waiting page and/or orders. Will continue to monitor patient to end of shift.

## 2015-07-28 NOTE — ED Provider Notes (Signed)
CSN: [id number]     Arrival date & time 07/28/15  0000 History   First MD Initiated Contact with Patient 07/28/15 0047     Chief Complaint  Patient presents with  . Fall     (Consider location/radiation/quality/duration/timing/severity/associated sxs/prior Treatment) Patient is a 81 y.o. female presenting with fall. The history is provided by the patient. No language interpreter was used.  Fall This is a new problem. The current episode started today. Associated symptoms include headaches. Pertinent negatives include no abdominal pain, chest pain, chills, fever or neck pain. Associated symptoms comments: Patient with a history of falls secondary to balance issues, walks with walker, presents with head laceration after fall tonight and hitting the floor. No LOC, nausea or vomiting. She denies pre-fall dizziness, chest pain, visual changes. She has not been ill recently. She states she loses her balance frequently with frequent falls. .    Past Medical History  Diagnosis Date  . Hypertension   . GERD (gastroesophageal reflux disease)    Past Surgical History  Procedure Laterality Date  . Colon surgery     No family history on file. Social History  Substance Use Topics  . Smoking status: Never Smoker   . Smokeless tobacco: None  . Alcohol Use: No   OB History    No data available     Review of Systems  Constitutional: Negative for fever and chills.  Respiratory: Negative.  Negative for shortness of breath.   Cardiovascular: Negative.  Negative for chest pain.  Gastrointestinal: Negative.  Negative for abdominal pain.  Musculoskeletal: Negative.  Negative for back pain and neck pain.  Skin: Positive for wound.  Neurological: Positive for headaches.      Allergies  Review of patient's allergies indicates no known allergies.  Home Medications   Prior to Admission medications   Medication Sig Start Date End Date Taking? Authorizing Provider  aspirin EC 81 MG tablet  Take 81 mg by mouth daily.   Yes Historical Provider, MD  gabapentin (NEURONTIN) 300 MG capsule Take 300 mg by mouth 3 (three) times daily.   Yes Historical Provider, MD  metoprolol succinate (TOPROL-XL) 25 MG 24 hr tablet Take 25 mg by mouth daily.   Yes Historical Provider, MD  naproxen (NAPROSYN) 500 MG tablet Take 500 mg by mouth daily.   Yes Historical Provider, MD  omeprazole (PRILOSEC) 20 MG capsule Take 20 mg by mouth 2 (two) times daily before a meal.   Yes Historical Provider, MD  traMADol (ULTRAM) 50 MG tablet Take 50 mg by mouth every 6 (six) hours as needed for moderate pain.   Yes Historical Provider, MD   BP 151/71 mmHg  Pulse 88  Temp(Src) 98.2 F (36.8 C) (Oral)  Resp 18  Ht 5\' 2"  (1.575 m)  Wt 155 lb (70.308 kg)  BMI 28.34 kg/m2  SpO2 94% Physical Exam  Constitutional: She is oriented to person, place, and time. She appears well-developed and well-nourished.  HENT:  Head: Normocephalic.  Neck: Normal range of motion. Neck supple.  Cardiovascular: Normal rate and regular rhythm.   Pulmonary/Chest: Effort normal and breath sounds normal. She has no wheezes. She has no rales. She exhibits no tenderness.  Abdominal: Soft. Bowel sounds are normal. There is no tenderness. There is no rebound and no guarding.  Musculoskeletal: Normal range of motion. She exhibits no tenderness.  Neurological: She is alert and oriented to person, place, and time.  Awake, alert, oriented. Coordination is full, without deficit. CN's 3-12 intact. Speech  clear and focused.   Skin: Skin is warm and dry.  Laceration to parietal scalp.  Psychiatric: She has a normal mood and affect.    ED Course  Procedures (including critical care time) Labs Review Labs Reviewed - No data to display  Imaging Review No results found. I have personally reviewed and evaluated these images and lab results as part of my medical decision-making.   EKG Interpretation None     LACERATION REPAIR Performed by:  Charlann Lange A Authorized by: Charlann Lange A Consent: Verbal consent obtained. Risks and benefits: risks, benefits and alternatives were discussed Consent given by: patient Patient identity confirmed: provided demographic data Prepped and Draped in normal sterile fashion Wound explored  Laceration Location: parietal scalp  Laceration Length: 3 cm  No Foreign Bodies seen or palpated  Anesthesia: local infiltration  Local anesthetic: lidocaine 2% w/epinephrine  Anesthetic total: 2 ml  Irrigation method: syringe Amount of cleaning: standard  Skin closure: staples  Number of sutures: 3  Technique: staples  Patient tolerance: Patient tolerated the procedure well with no immediate complications.  MDM   Final diagnoses:  None    1. Fall 2. Scalp laceration 3. Hypoxia 4. anemia   The patient is found to desaturate to mid-80's on room air. No complaint of SOB, CP. She has a history of CHF and is found to have cardiomegaly, and pulmonary venous congestion on CXR. She is evaluated by Dr. Roxanne Mins and will be admitted for further evaluation and management.   Scalp laceration repaired as above note Negative Head and Neck CT. Admission discussed with patient and family.   Charlann Lange, PA-C [id number]  Delora Fuel, MD [id number] [phone]

## 2015-07-28 NOTE — Care Management Note (Signed)
Case Management Note  Patient Details  Name: Jessica Blackwell MRN: [id number] Date of Birth: 17-Jun-1925  Subjective/Objective:    Admitted post fall at home                Action/Plan: Patient lives alone with family members close by. Daughter [name] at bedside [facility]) . Patient stated that she does not want any HHC at this time. Stated " I had them and I don't think that it made a bit of difference." [name] stated that her mother is tired and need to think about [facility] services. CM informed daughter that if the patient does not want it at this time, it can be arranged at the PCP office.  Expected Discharge Date:   possibly [id number]               Expected Discharge Plan:  [place]   Choice offered to:  Patient    Status of Service:  In process, will continue to follow  Sherrilyn Rist [id number] 07/28/2015, 2:10 PM

## 2015-07-28 NOTE — ED Notes (Signed)
Pt comes from home via [facility], pt had a fall in kitchen, fell backwards, denies LOC, laceration to head, bleeding controlled, pt not on blood thinners. Denies neck and back pain.

## 2015-07-28 NOTE — Progress Notes (Signed)
[facility] PROGRESS NOTE    Progress Note   BAYE SCHNOOR [id number] DOB: April 04, 1925 DOA: 07/28/2015 PCP: Idelle Crouch, MD   Brief Narrative:   Jessica Blackwell is an 81 y.o. female past medical history of congestive heart failure who presents with a fall that happened after dinner, she never lost consciousness. She was brought to the ED and saturations were in the 80s which prompted a chest x-ray that showed cardiomegaly with heart failure and a BNP of 466.  Assessment/Plan:   Fall at home/scalp laceration: PT consult in, laceration was repaired in the ED. - Check a B-12 and RBC folate.  Acute respiratory failure with hypoxia: Likely due to heart failure, her chest x-ray is compatible with pulmonary edema with elevated BNP. She diuresis about 1.4 L, her blood pressure and creatinine has remained stable, will continue IV diuresis. Continue metoprolol. 2-D echo is pending. Troponins are negative no events on telemetry.  GERD (gastroesophageal reflux disease) Cont Protonix.  Essential  HTN (hypertension):   At goal continue IV Lasix and metoprolol.  Microcytic Anemia: Will need a follow-up with his PCP for her microcytic anemia L start her on oral iron.    DVT Prophylaxis - Lovenox ordered.  Family Communication: none Disposition Plan: Home when stable. Code Status:     Code Status Orders        Start     Ordered   07/28/15 0636  Full code   Continuous     07/28/15 0638    Advance Directive Documentation        Most Recent Value   Type of Advance Directive  Healthcare Power of Attorney   Pre-existing out of facility DNR order (yellow form or pink MOST form)     "MOST" Form in Place?          IV Access:    Peripheral IV   Procedures and diagnostic studies:   Dg Chest 2 View  07/28/2015  CLINICAL DATA:  Hypoxia EXAM: CHEST  2 VIEW COMPARISON:  01/15/2013 chest CT. 03/16/2014 chest x-ray not available FINDINGS: Chronic cardiomegaly and  marked pulmonary artery enlargement. Diffuse interstitial coarsening without Kerley lines or pleural effusions. There is a 4 cm mass with left lateral indistinct appearance, correlating with pleural based mass 01/15/2013 by chest CT. This mass has shown indolent growth by CT since 2010, but is grossly stable compared to previous CT. 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 No pneumonia. No pneumothorax. IMPRESSION: 1. Cardiomegaly, pulmonary hypertension, and pulmonary venous congestion. 2. 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 Electronically Signed   By: Monte Fantasia M.D.   On: 07/28/2015 03:14   Ct Head Wo Contrast  07/28/2015  CLINICAL DATA:  Fall with head injury and laceration EXAM: CT HEAD WITHOUT CONTRAST CT CERVICAL SPINE WITHOUT CONTRAST TECHNIQUE: Multidetector CT imaging of the head and cervical spine was performed following the standard protocol without intravenous contrast. Multiplanar CT image reconstructions of the cervical spine were also generated. COMPARISON:  03/29/2015 head CT FINDINGS: Scout image shows a left pulmonary nodule which correlates with pleural based mass on 01/15/2013 chest CT. No gross enlargement. CT HEAD FINDINGS Skull and Sinuses:High posterior scalp laceration without calvarial fracture. Orbits: Soft tissue nodule in the anterior and nasal left orbit which is stable from prior and has been present since at least 2010, consistent with benign process. Morphology favors varix, although location is somewhat unusual. Bilateral cataract resection. Brain: No evidence of acute infarction, hemorrhage,  hydrocephalus, or mass lesion/mass effect. Brain atrophy, asymmetrically advanced at the cerebellum and brainstem. Typical for age chronic small vessel ischemic change in the deep cerebral white matter. CT CERVICAL SPINE FINDINGS Negative for acute fracture or traumatic malalignment (C3-4 slight anterolisthesis appears degenerative). No prevertebral  edema. No gross cervical canal hematoma. C4-5 and C5-6 degenerative disc disease with disc osteophyte complexes contacting the ventral cord. Diffuse uncovertebral spurring with multilevel foraminal stenosis, greatest at C4-5, C5-6, and C6-7. Imaging of the upper chest shows chronic enlargement of the main pulmonary artery consistent with pulmonary hypertension. Visible portion measures 42 mm in diameter. There is also multi nodular enlargement of the thyroid gland with dominant nodule in the lower left pole measuring up to 4 cm, size stable from 01/15/2013 chest CT. IMPRESSION: 1. No evidence of acute intracranial or cervical spine injury. 2. Scalp laceration without calvarial fracture. 3. Multiple chronic incidental findings described above. Electronically Signed   By: Monte Fantasia M.D.   On: 07/28/2015 02:05   Ct Cervical Spine Wo Contrast  07/28/2015  CLINICAL DATA:  Fall with head injury and laceration EXAM: CT HEAD WITHOUT CONTRAST CT CERVICAL SPINE WITHOUT CONTRAST TECHNIQUE: Multidetector CT imaging of the head and cervical spine was performed following the standard protocol without intravenous contrast. Multiplanar CT image reconstructions of the cervical spine were also generated. COMPARISON:  03/29/2015 head CT FINDINGS: Scout image shows a left pulmonary nodule which correlates with pleural based mass on 01/15/2013 chest CT. No gross enlargement. CT HEAD FINDINGS Skull and Sinuses:High posterior scalp laceration without calvarial fracture. Orbits: Soft tissue nodule in the anterior and nasal left orbit which is stable from prior and has been present since at least 2010, consistent with benign process. Morphology favors varix, although location is somewhat unusual. Bilateral cataract resection. Brain: No evidence of acute infarction, hemorrhage, hydrocephalus, or mass lesion/mass effect. Brain atrophy, asymmetrically advanced at the cerebellum and brainstem. Typical for age chronic small vessel  ischemic change in the deep cerebral white matter. CT CERVICAL SPINE FINDINGS Negative for acute fracture or traumatic malalignment (C3-4 slight anterolisthesis appears degenerative). No prevertebral edema. No gross cervical canal hematoma. C4-5 and C5-6 degenerative disc disease with disc osteophyte complexes contacting the ventral cord. Diffuse uncovertebral spurring with multilevel foraminal stenosis, greatest at C4-5, C5-6, and C6-7. Imaging of the upper chest shows chronic enlargement of the main pulmonary artery consistent with pulmonary hypertension. Visible portion measures 42 mm in diameter. There is also multi nodular enlargement of the thyroid gland with dominant nodule in the lower left pole measuring up to 4 cm, size stable from 01/15/2013 chest CT. IMPRESSION: 1. No evidence of acute intracranial or cervical spine injury. 2. Scalp laceration without calvarial fracture. 3. Multiple chronic incidental findings described above. Electronically Signed   By: Monte Fantasia M.D.   On: 07/28/2015 02:05     Medical Consultants:    None.  Anti-Infectives:   Anti-infectives    None      Subjective:    Royetta Crochet [place] no new complaints she wants to go home.  Objective:    Filed Vitals:   07/28/15 0515 07/28/15 0530 07/28/15 0545 07/28/15 0624  BP: 132/85 122/57 131/68 137/66  Pulse: 79 79 76 76  Temp:    98 F (36.7 C)  TempSrc:    Oral  Resp: 17 18 17 24   Height:    5\' 2"  (1.575 m)  Weight:    66.86 kg (147 lb 6.4 oz)  SpO2: 99% 94% 94% 99%  Intake/Output Summary (Last 24 hours) at 07/28/15 0945 Last data filed at 07/28/15 0908  Gross per 24 hour  Intake    120 ml  Output   1400 ml  Net  -1280 ml   Filed Weights   07/28/15 0002 07/28/15 0624  Weight: 70.308 kg (155 lb) 66.86 kg (147 lb 6.4 oz)    Exam: Gen:  NAD Cardiovascular:  RRR, positive JVD Chest and lungs:   CTAB Abdomen:  Abdomen soft, NT/ND, + BS Extremities:  No C/E/C   Data Reviewed:     Labs: Basic Metabolic Panel:  Recent Labs Lab 07/28/15 0328  NA 140  K 4.6  CL 106  CO2 26  GLUCOSE 147*  BUN 15  CREATININE 0.57  CALCIUM 8.9   GFR Estimated Creatinine Clearance: 43.6 mL/min (by C-G formula based on Cr of 0.57). Liver Function Tests: No results for input(s): AST, ALT, ALKPHOS, BILITOT, PROT, ALBUMIN in the last 168 hours. No results for input(s): LIPASE, AMYLASE in the last 168 hours. No results for input(s): AMMONIA in the last 168 hours. Coagulation profile No results for input(s): INR, PROTIME in the last 168 hours.  CBC:  Recent Labs Lab 07/28/15 0328 07/28/15 0835  WBC 9.0  --   NEUTROABS 6.5  --   HGB 9.5* 9.5*  HCT 32.6* 32.6*  MCV 64.8*  --   PLT 164  --    Cardiac Enzymes:  Recent Labs Lab 07/28/15 0328  TROPONINI 0.03   BNP (last 3 results) No results for input(s): PROBNP in the last 8760 hours. CBG: No results for input(s): GLUCAP in the last 168 hours. D-Dimer: No results for input(s): DDIMER in the last 72 hours. Hgb A1c: No results for input(s): HGBA1C in the last 72 hours. Lipid Profile: No results for input(s): CHOL, HDL, LDLCALC, TRIG, CHOLHDL, LDLDIRECT in the last 72 hours. Thyroid function studies: No results for input(s): TSH, T4TOTAL, T3FREE, THYROIDAB in the last 72 hours.  Invalid input(s): FREET3 Anemia work up: No results for input(s): VITAMINB12, FOLATE, FERRITIN, TIBC, IRON, RETICCTPCT in the last 72 hours. Sepsis Labs:  Recent Labs Lab 07/28/15 0328  WBC 9.0   Microbiology No results found for this or any previous visit (from the past 240 hour(s)).   Medications:   . aspirin EC  81 mg Oral Daily  . enoxaparin (LOVENOX) injection  40 mg Subcutaneous Q24H  . furosemide  40 mg Intravenous Daily  . gabapentin  300 mg Oral TID  . metoprolol succinate  25 mg Oral Daily  . pantoprazole  40 mg Oral Daily  . sodium chloride  3 mL Intravenous Q12H   Continuous Infusions:   Time spent: 25  min   LOS: 0 days   Charlynne Cousins  [facility] Pager [phone]  *Please refer to [email or web address], password [email or web address] to get updated schedule on who will round on this patient, as hospitalists switch teams weekly. If 7PM-7AM, please contact night-coverage at [email or web address], password [email or web address] for any overnight needs.  07/28/2015, 9:45 AM

## 2015-07-29 LAB — BASIC METABOLIC PANEL
ANION GAP: 8 (ref 5–15)
BUN: 22 mg/dL — ABNORMAL HIGH (ref 6–20)
CHLORIDE: 104 mmol/L (ref 101–111)
CO2: 28 mmol/L (ref 22–32)
Calcium: 9 mg/dL (ref 8.9–10.3)
Creatinine, Ser: 0.68 mg/dL (ref 0.44–1.00)
GFR calc non Af Amer: >60 mL/min (ref >60)
GLUCOSE: 167 mg/dL — AB (ref 65–99)
POTASSIUM: 3.7 mmol/L (ref 3.5–5.1)
Sodium: 140 mmol/L (ref 135–145)

## 2015-07-29 MED ORDER — GUAIFENESIN-DM 100-10 MG/5ML PO SYRP
5.0000 mL | ORAL_SOLUTION | ORAL | Status: DC | PRN
  Administered 2015-07-29 – 2015-08-01 (×3): 5 mL via ORAL
  Filled 2015-07-29 (×3): qty 5

## 2015-07-29 MED ORDER — FERROUS SULFATE 325 (65 FE) MG PO TABS
325.0000 mg | ORAL_TABLET | Freq: Three times a day (TID) | ORAL | Status: DC
  Administered 2015-07-29 – 2015-08-02 (×12): 325 mg via ORAL
  Filled 2015-07-29 (×12): qty 1

## 2015-07-29 NOTE — Progress Notes (Addendum)
[facility] PROGRESS NOTE    Progress Note   Jessica Blackwell [id number] DOB: [birth year] DOA: 07/28/2015 PCP: Idelle Crouch, MD   Brief Narrative:   Jessica Blackwell is an 80 y.o. female past medical history of congestive heart failure who presents with a fall that happened after dinner, she never lost consciousness. She was brought to the ED and saturations were in the 80s which prompted a chest x-ray that showed cardiomegaly with heart failure and a BNP of 466.  Assessment/Plan:   Fall at home/scalp laceration: PT consult in, laceration was repaired in the ED. B-12 is greater than 7500 RBC folate is pending.  Acute respiratory failure with hypoxia: Poor I's and O's recorded her blood pressure and creatinine has remained stable, will continue IV diuresis. Weight is a basic metabolic panel is pending. Continue metoprolol. 2-D echo is pending. Troponins are negative no events on telemetry.  GERD (gastroesophageal reflux disease) Cont Protonix.  Essential  HTN (hypertension): BP at goal continue IV Lasix and metoprolol.  Microcytic Anemia: Will need a follow-up with his PCP for her microcytic anemia, start her on oral iron.    DVT Prophylaxis - Lovenox ordered.  Family Communication: none Disposition Plan: Home when stable. Code Status:     Code Status Orders        Start     Ordered   07/28/15 0636  Full code   Continuous     07/28/15 0638    Advance Directive Documentation        Most Recent Value   Type of Advance Directive  Healthcare Power of Attorney   Pre-existing out of facility DNR order (yellow form or pink MOST form)     "MOST" Form in Place?          IV Access:    Peripheral IV   Procedures and diagnostic studies:   X-ray Chest Pa And Lateral  07/28/2015  CLINICAL DATA:  Shortness of breath EXAM: CHEST  2 VIEW COMPARISON:  07/28/2015 FINDINGS: There is cardiomegaly. Marked enlargement of the main pulmonary artery, stable. No  confluent airspace opacities or effusions. Chronic left upper lobe pleural based lesion again noted. IMPRESSION: Cardiomegaly.  Marked pulmonary arterial enlargement, stable. Pleural-based left upper lobe lesion again noted, unchanged. Electronically Signed   By: Rolm Baptise M.D.   On: 07/28/2015 11:00   Dg Chest 2 View  07/28/2015  CLINICAL DATA:  Hypoxia EXAM: CHEST  2 VIEW COMPARISON:  01/15/2013 chest CT. 03/16/2014 chest x-ray not available FINDINGS: Chronic cardiomegaly and marked pulmonary artery enlargement. Diffuse interstitial coarsening without Kerley lines or pleural effusions. There is a 4 cm mass with left lateral indistinct appearance, correlating with pleural based mass 01/15/2013 by chest CT. This mass has shown indolent growth by CT since 2010, but is grossly stable compared to previous CT. 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 No pneumonia. No pneumothorax. IMPRESSION: 1. Cardiomegaly, pulmonary hypertension, and pulmonary venous congestion. 2. 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 Electronically Signed   By: Monte Fantasia M.D.   On: 07/28/2015 03:14   Ct Head Wo Contrast  07/28/2015  CLINICAL DATA:  Fall with head injury and laceration EXAM: CT HEAD WITHOUT CONTRAST CT CERVICAL SPINE WITHOUT CONTRAST TECHNIQUE: Multidetector CT imaging of the head and cervical spine was performed following the standard protocol without intravenous contrast. Multiplanar CT image reconstructions of the cervical spine were also generated. COMPARISON:  03/29/2015 head CT FINDINGS: Scout image shows a left  pulmonary nodule which correlates with pleural based mass on 01/15/2013 chest CT. No gross enlargement. CT HEAD FINDINGS Skull and Sinuses:High posterior scalp laceration without calvarial fracture. Orbits: Soft tissue nodule in the anterior and nasal left orbit which is stable from prior and has been present since at least 2010, consistent with benign process.  Morphology favors varix, although location is somewhat unusual. Bilateral cataract resection. Brain: No evidence of acute infarction, hemorrhage, hydrocephalus, or mass lesion/mass effect. Brain atrophy, asymmetrically advanced at the cerebellum and brainstem. Typical for age chronic small vessel ischemic change in the deep cerebral white matter. CT CERVICAL SPINE FINDINGS Negative for acute fracture or traumatic malalignment (C3-4 slight anterolisthesis appears degenerative). No prevertebral edema. No gross cervical canal hematoma. C4-5 and C5-6 degenerative disc disease with disc osteophyte complexes contacting the ventral cord. Diffuse uncovertebral spurring with multilevel foraminal stenosis, greatest at C4-5, C5-6, and C6-7. Imaging of the upper chest shows chronic enlargement of the main pulmonary artery consistent with pulmonary hypertension. Visible portion measures 42 mm in diameter. There is also multi nodular enlargement of the thyroid gland with dominant nodule in the lower left pole measuring up to 4 cm, size stable from 01/15/2013 chest CT. IMPRESSION: 1. No evidence of acute intracranial or cervical spine injury. 2. Scalp laceration without calvarial fracture. 3. Multiple chronic incidental findings described above. Electronically Signed   By: Monte Fantasia M.D.   On: 07/28/2015 02:05   Ct Cervical Spine Wo Contrast  07/28/2015  CLINICAL DATA:  Fall with head injury and laceration EXAM: CT HEAD WITHOUT CONTRAST CT CERVICAL SPINE WITHOUT CONTRAST TECHNIQUE: Multidetector CT imaging of the head and cervical spine was performed following the standard protocol without intravenous contrast. Multiplanar CT image reconstructions of the cervical spine were also generated. COMPARISON:  03/29/2015 head CT FINDINGS: Scout image shows a left pulmonary nodule which correlates with pleural based mass on 01/15/2013 chest CT. No gross enlargement. CT HEAD FINDINGS Skull and Sinuses:High posterior scalp  laceration without calvarial fracture. Orbits: Soft tissue nodule in the anterior and nasal left orbit which is stable from prior and has been present since at least 2010, consistent with benign process. Morphology favors varix, although location is somewhat unusual. Bilateral cataract resection. Brain: No evidence of acute infarction, hemorrhage, hydrocephalus, or mass lesion/mass effect. Brain atrophy, asymmetrically advanced at the cerebellum and brainstem. Typical for age chronic small vessel ischemic change in the deep cerebral white matter. CT CERVICAL SPINE FINDINGS Negative for acute fracture or traumatic malalignment (C3-4 slight anterolisthesis appears degenerative). No prevertebral edema. No gross cervical canal hematoma. C4-5 and C5-6 degenerative disc disease with disc osteophyte complexes contacting the ventral cord. Diffuse uncovertebral spurring with multilevel foraminal stenosis, greatest at C4-5, C5-6, and C6-7. Imaging of the upper chest shows chronic enlargement of the main pulmonary artery consistent with pulmonary hypertension. Visible portion measures 42 mm in diameter. There is also multi nodular enlargement of the thyroid gland with dominant nodule in the lower left pole measuring up to 4 cm, size stable from 01/15/2013 chest CT. IMPRESSION: 1. No evidence of acute intracranial or cervical spine injury. 2. Scalp laceration without calvarial fracture. 3. Multiple chronic incidental findings described above. Electronically Signed   By: Monte Fantasia M.D.   On: 07/28/2015 02:05     Medical Consultants:    None.  Anti-Infectives:   Anti-infectives    None      Subjective:    Jessica Blackwell she relates she feels better her breathing continues to improve.  Objective:    Filed Vitals:   07/28/15 0624 07/28/15 1138 07/28/15 2030 07/29/15 0500  BP: 137/66 122/54 132/54 119/51  Pulse: 76 80 88 60  Temp: 98 F (36.7 C) 98.3 F (36.8 C) 98.4 F (36.9 C) 98.7 F (37.1  C)  TempSrc: Oral Oral Oral Oral  Resp: 24 18 18 18   Height: 5\' 2"  (1.575 m)     Weight: 66.86 kg (147 lb 6.4 oz)   69.673 kg (153 lb 9.6 oz)  SpO2: 99% 100% 96% 100%    Intake/Output Summary (Last 24 hours) at 07/29/15 0944 Last data filed at 07/29/15 0829  Gross per 24 hour  Intake   1323 ml  Output   1200 ml  Net    123 ml   Filed Weights   07/28/15 0002 07/28/15 0624 07/29/15 0500  Weight: 70.308 kg (155 lb) 66.86 kg (147 lb 6.4 oz) 69.673 kg (153 lb 9.6 oz)    Exam: Gen:  NAD Cardiovascular:  RRR, positive JVD Chest and lungs:   CTAB Abdomen:  Abdomen soft, NT/ND, + BS Extremities:  No C/E/C   Data Reviewed:    Labs: Basic Metabolic Panel:  Recent Labs Lab 07/28/15 0328 07/28/15 2058  NA 140  --   K 4.6  --   CL 106  --   CO2 26  --   GLUCOSE 147*  --   BUN 15  --   CREATININE 0.57  --   CALCIUM 8.9  --   MG  --  2.0   GFR Estimated Creatinine Clearance: 44.4 mL/min (by C-G formula based on Cr of 0.57). Liver Function Tests: No results for input(s): AST, ALT, ALKPHOS, BILITOT, PROT, ALBUMIN in the last 168 hours. No results for input(s): LIPASE, AMYLASE in the last 168 hours. No results for input(s): AMMONIA in the last 168 hours. Coagulation profile No results for input(s): INR, PROTIME in the last 168 hours.  CBC:  Recent Labs Lab 07/28/15 0328 07/28/15 0835  WBC 9.0  --   NEUTROABS 6.5  --   HGB 9.5* 9.5*  HCT 32.6* 32.6*  MCV 64.8*  --   PLT 164  --    Cardiac Enzymes:  Recent Labs Lab 07/28/15 0328  TROPONINI 0.03   BNP (last 3 results) No results for input(s): PROBNP in the last 8760 hours. CBG: No results for input(s): GLUCAP in the last 168 hours. D-Dimer: No results for input(s): DDIMER in the last 72 hours. Hgb A1c: No results for input(s): HGBA1C in the last 72 hours. Lipid Profile: No results for input(s): CHOL, HDL, LDLCALC, TRIG, CHOLHDL, LDLDIRECT in the last 72 hours. Thyroid function studies: No results for  input(s): TSH, T4TOTAL, T3FREE, THYROIDAB in the last 72 hours.  Invalid input(s): FREET3 Anemia work up:  Recent Labs  07/28/15 1100  VITAMINB12 >7500*   Sepsis Labs:  Recent Labs Lab 07/28/15 0328  WBC 9.0   Microbiology No results found for this or any previous visit (from the past 240 hour(s)).   Medications:   . aspirin EC  81 mg Oral Daily  . enoxaparin (LOVENOX) injection  40 mg Subcutaneous Q24H  . ferrous sulfate  325 mg Oral TID WC  . furosemide  40 mg Intravenous Daily  . gabapentin  300 mg Oral TID  . metoprolol succinate  25 mg Oral Daily  . pantoprazole  40 mg Oral Daily  . sodium chloride  3 mL Intravenous Q12H   Continuous Infusions:   Time spent: 15  min   LOS: 1 day   Charlynne Cousins  [facility] Pager [phone]  *Please refer to [email or web address], password [email or web address] to get updated schedule on who will round on this patient, as hospitalists switch teams weekly. If 7PM-7AM, please contact night-coverage at [email or web address], password [email or web address] for any overnight needs.  07/29/2015, 9:44 AM

## 2015-07-29 NOTE — Progress Notes (Signed)
During nursing report off, patient is stable and sleeping; no concerns at this time, will continue to monitor. Ruben Im RN

## 2015-07-29 NOTE — Evaluation (Addendum)
2Physical Therapy Evaluation Patient Details Name: Jessica Blackwell MRN: [id number] DOB: Oct 14, 1924 Today's Date: 07/29/2015   History of Present Illness  Patient is a 81 y/o female presents s/p fall at home, laceration done in the ED.  Found to have saturations in the 80s which prompted a chest x-ray that showed cardiomegaly with heart failure and a BNP of 466. PMh includes HTN,  CHF, neuropathy.  Clinical Impression  Patient presents with generalized weakness, balance deficits and DOE impacting mobility. Pt high falls risk with multiple fall history. Requires assist standing from low surfaces. Discussed in length importance of going to rehab prior to return home however pt refusing. Discussed some safety techniques for home - not wearing slippery bed room shoes on kitchen floor (last 2 falls were here), lights on etc. Pt reports her daughter will be able to stay with her a few days. If pt continues to refuse St SNF, recommend HHPT and 24/7 S for safety. Will follow acutely per current POC.     Follow Up Recommendations SNF;Supervision/Assistance - 24 hour    Equipment Recommendations  None recommended by PT    Recommendations for Other Services       Precautions / Restrictions Precautions Precautions: Fall Restrictions Weight Bearing Restrictions: No      Mobility  Bed Mobility               General bed mobility comments: Sitting in chair upon PT arrival.   Transfers Overall transfer level: Needs assistance Equipment used: Rolling walker (2 wheeled) Transfers: Sit to/from Stand Sit to Stand: Min assist         General transfer comment: Min A to boost from chair with multiple attempts and use of body momentum.   Ambulation/Gait Ambulation/Gait assistance: Min guard Ambulation Distance (Feet): 100 Feet Assistive device: Rolling walker (2 wheeled) Gait Pattern/deviations: Step-through pattern;Decreased stride length Gait velocity: decreased   General Gait  Details: very slow, mildly unsteady gait. Dyspnea 2/4. Sp02 stayed >92% on RA. A few short standing rest breaks. HR increased to 122 bpm.  Stairs            Wheelchair Mobility    Modified Rankin (Stroke Patients Only)       Balance Overall balance assessment: Needs assistance Sitting-balance support: Feet supported;No upper extremity supported Sitting balance-Leahy Scale: Fair     Standing balance support: During functional activity Standing balance-Leahy Scale: Poor Standing balance comment: Relient on RW for support.                              Pertinent Vitals/Pain Pain Assessment: No/denies pain    Home Living Family/patient expects to be discharged to:: Private residence Living Arrangements: Alone Available Help at Discharge: Family;Available PRN/intermittently Type of Home: House Home Access: Stairs to enter Entrance Stairs-Rails: Right Entrance Stairs-Number of Steps: 3 Home Layout: One level Home Equipment: Walker - 4 wheels      Prior Function Level of Independence: Independent with assistive device(s)         Comments: Pt uses rollator for ambulation. Reports minimal cooking. Reports multiple falls at home.      Hand Dominance        Extremity/Trunk Assessment   Upper Extremity Assessment: Defer to OT evaluation           Lower Extremity Assessment: Generalized weakness         Communication   Communication: HOH  Cognition Arousal/Alertness: Awake/alert Behavior During  Therapy: WFL for tasks assessed/performed Overall Cognitive Status: Within Functional Limits for tasks assessed                      General Comments      Exercises        Assessment/Plan    PT Assessment Patient needs continued PT services  PT Diagnosis Difficulty walking;Generalized weakness   PT Problem List Decreased strength;Cardiopulmonary status limiting activity;Decreased activity tolerance;Decreased balance;Decreased  mobility;Decreased safety awareness  PT Treatment Interventions Balance training;Gait training;Stair training;Functional mobility training;Therapeutic activities;Therapeutic exercise;Patient/family education   PT Goals (Current goals can be found in the Care Plan section) Acute Rehab PT Goals Patient Stated Goal: to go home PT Goal Formulation: With patient Time For Goal Achievement: 08/12/15 Potential to Achieve Goals: Fair    Frequency Min 3X/week   Barriers to discharge Decreased caregiver support;Inaccessible home environment 3 steps to enter home and pt lives alone.     Co-evaluation               End of Session Equipment Utilized During Treatment: Gait belt Activity Tolerance: Patient limited by fatigue;Patient tolerated treatment well Patient left: in chair;with call bell/phone within reach;with chair alarm set Nurse Communication: Mobility status         Time: [id number] PT Time Calculation (min) (ACUTE ONLY): 24 min   Charges:   PT Evaluation $Initial PT Evaluation Tier I: 1 Procedure PT Treatments $Gait Training: 8-22 mins   PT G Codes:        Kenshawn Maciolek A Vontae Court 07/29/2015, 10:14 AM  Wray Kearns, [place], DPT [phone]

## 2015-07-29 NOTE — Progress Notes (Signed)
Pharmacist Heart Failure Core Measure Documentation  Assessment: Jessica Blackwell has an EF documented as 40% by PMH..  Rationale: Heart failure patients with left ventricular systolic dysfunction (LVSD) and an EF < 40% should be prescribed an angiotensin converting enzyme inhibitor (ACEI) or angiotensin receptor blocker (ARB) at discharge unless a contraindication is documented in the medical record.  This patient is not currently on an ACEI or ARB for HF.  This note is being placed in the record in order to provide documentation that a contraindication to the use of these agents is present for this encounter.  ACE Inhibitor or Angiotensin Receptor Blocker is contraindicated (specify all that apply)  []   ACEI allergy AND ARB allergy []   Angioedema []   Moderate or severe aortic stenosis []   Hyperkalemia []   Hypotension []   Renal artery stenosis []   Worsening renal function, preexisting renal disease or dysfunction  Thank you.   Estelle June 07/29/2015 5:23 PM

## 2015-07-30 ENCOUNTER — Inpatient Hospital Stay (HOSPITAL_COMMUNITY): Payer: Medicare Other

## 2015-07-30 DIAGNOSIS — R0600 Dyspnea, unspecified: Secondary | ICD-10-CM

## 2015-07-30 LAB — BASIC METABOLIC PANEL
Anion gap: 8 (ref 5–15)
BUN: 27 mg/dL — AB (ref 6–20)
CO2: 27 mmol/L (ref 22–32)
CREATININE: 0.62 mg/dL (ref 0.44–1.00)
Calcium: 8.9 mg/dL (ref 8.9–10.3)
Chloride: 106 mmol/L (ref 101–111)
GFR calc Af Amer: >60 mL/min (ref >60)
Glucose, Bld: 131 mg/dL — ABNORMAL HIGH (ref 65–99)
POTASSIUM: 3.5 mmol/L (ref 3.5–5.1)
SODIUM: 141 mmol/L (ref 135–145)

## 2015-07-30 MED ORDER — FUROSEMIDE 10 MG/ML IJ SOLN
40.0000 mg | Freq: Two times a day (BID) | INTRAMUSCULAR | Status: DC
  Administered 2015-07-30 – 2015-07-31 (×2): 40 mg via INTRAVENOUS
  Filled 2015-07-30 (×2): qty 4

## 2015-07-30 NOTE — Progress Notes (Signed)
  Echocardiogram 2D Echocardiogram has been performed.  Jessica Blackwell 07/30/2015, 12:38 PM

## 2015-07-30 NOTE — Progress Notes (Signed)
[facility] PROGRESS NOTE    Progress Note   ORRIS GRASSE [id number] DOB: Jan 30, 1925 DOA: 07/28/2015 PCP: Idelle Crouch, MD   Brief Narrative:   AMADEA VIK is an 81 y.o. female past medical history of congestive heart failure who presents with a fall that happened after dinner, she never lost consciousness. She was brought to the ED and saturations were in the 80s which prompted a chest x-ray that showed cardiomegaly with heart failure and a BNP of 466.  Assessment/Plan:   Fall at home/scalp laceration: PT consult in, laceration was repaired in the ED. B-12 is greater than 7500 RBC folate is pending.  Acute respiratory failure with hypoxia: Good urine output creatinine and electrolytes are stable. We'll increase her IV Lasix to 40 twice a day. Continue metoprolol. 2-D echo is pending.   GERD (gastroesophageal reflux disease) Cont Protonix.  Essential  HTN (hypertension): BP at goal continue IV Lasix and metoprolol.  Microcytic Anemia: Will need a follow-up with his PCP for her microcytic anemia, start her on oral iron.    DVT Prophylaxis - Lovenox ordered.  Family Communication: none Disposition Plan: Home when stable. Code Status:     Code Status Orders        Start     Ordered   07/28/15 0636  Full code   Continuous     07/28/15 0638    Advance Directive Documentation        Most Recent Value   Type of Advance Directive  Healthcare Power of Attorney   Pre-existing out of facility DNR order (yellow form or pink MOST form)     "MOST" Form in Place?          IV Access:    Peripheral IV   Procedures and diagnostic studies:   No results found.   Medical Consultants:    None.  Anti-Infectives:   Anti-infectives    None      Subjective:    [name] she relates she feels better.  Objective:    Filed Vitals:   07/29/15 1205 07/29/15 2100 07/30/15 0550 07/30/15 0920  BP: 105/77 119/57 113/55 137/56    Pulse: 89 92 99 94  Temp: 97.6 F (36.4 C) 98.1 F (36.7 C) 97.6 F (36.4 C) 98.7 F (37.1 C)  TempSrc: Oral Oral Oral Oral  Resp: 20 20 20 20   Height:      Weight:   65.545 kg (144 lb 8 oz)   SpO2: 98% 96% 91% 99%    Intake/Output Summary (Last 24 hours) at 07/30/15 1052 Last data filed at 07/30/15 1024  Gross per 24 hour  Intake    940 ml  Output   1200 ml  Net   -260 ml   Filed Weights   07/28/15 0624 07/29/15 0500 07/30/15 0550  Weight: 66.86 kg (147 lb 6.4 oz) 69.673 kg (153 lb 9.6 oz) 65.545 kg (144 lb 8 oz)    Exam: Gen:  NAD Cardiovascular:  RRR, positive JVD Chest and lungs:   CTAB Abdomen:  Abdomen soft, NT/ND, + BS Extremities:  No C/E/C   Data Reviewed:    Labs: Basic Metabolic Panel:  Recent Labs Lab 07/28/15 0328 07/28/15 2058 07/29/15 1045 07/30/15 0330  NA 140  --  140 141  K 4.6  --  3.7 3.5  CL 106  --  104 106  CO2 26  --  28 27  GLUCOSE 147*  --  167* 131*  BUN 15  --  22* 27*  CREATININE 0.57  --  0.68 0.62  CALCIUM 8.9  --  9.0 8.9  MG  --  2.0  --   --    GFR Estimated Creatinine Clearance: 43.2 mL/min (by C-G formula based on Cr of 0.62). Liver Function Tests: No results for input(s): AST, ALT, ALKPHOS, BILITOT, PROT, ALBUMIN in the last 168 hours. No results for input(s): LIPASE, AMYLASE in the last 168 hours. No results for input(s): AMMONIA in the last 168 hours. Coagulation profile No results for input(s): INR, PROTIME in the last 168 hours.  CBC:  Recent Labs Lab 07/28/15 0328 07/28/15 0835  WBC 9.0  --   NEUTROABS 6.5  --   HGB 9.5* 9.5*  HCT 32.6* 32.6*  MCV 64.8*  --   PLT 164  --    Cardiac Enzymes:  Recent Labs Lab 07/28/15 0328  TROPONINI 0.03   BNP (last 3 results) No results for input(s): PROBNP in the last 8760 hours. CBG: No results for input(s): GLUCAP in the last 168 hours. D-Dimer: No results for input(s): DDIMER in the last 72 hours. Hgb A1c: No results for input(s): HGBA1C in the  last 72 hours. Lipid Profile: No results for input(s): CHOL, HDL, LDLCALC, TRIG, CHOLHDL, LDLDIRECT in the last 72 hours. Thyroid function studies: No results for input(s): TSH, T4TOTAL, T3FREE, THYROIDAB in the last 72 hours.  Invalid input(s): FREET3 Anemia work up:  Recent Labs  07/28/15 1100  VITAMINB12 >7500*   Sepsis Labs:  Recent Labs Lab 07/28/15 0328  WBC 9.0   Microbiology No results found for this or any previous visit (from the past 240 hour(s)).   Medications:   . aspirin EC  81 mg Oral Daily  . enoxaparin (LOVENOX) injection  40 mg Subcutaneous Q24H  . ferrous sulfate  325 mg Oral TID WC  . furosemide  40 mg Intravenous Daily  . gabapentin  300 mg Oral TID  . metoprolol succinate  25 mg Oral Daily  . pantoprazole  40 mg Oral Daily  . sodium chloride  3 mL Intravenous Q12H   Continuous Infusions:   Time spent: 15 min   LOS: 2 days   Charlynne Cousins  [facility] Pager [phone]  *Please refer to [email or web address], password [email or web address] to get updated schedule on who will round on this patient, as hospitalists switch teams weekly. If 7PM-7AM, please contact night-coverage at [email or web address], password [email or web address] for any overnight needs.  07/30/2015, 10:52 AM

## 2015-07-31 ENCOUNTER — Encounter (HOSPITAL_COMMUNITY): Payer: Self-pay | Admitting: Student

## 2015-07-31 DIAGNOSIS — I5023 Acute on chronic systolic (congestive) heart failure: Secondary | ICD-10-CM

## 2015-07-31 DIAGNOSIS — I5021 Acute systolic (congestive) heart failure: Secondary | ICD-10-CM

## 2015-07-31 LAB — BASIC METABOLIC PANEL
ANION GAP: 9 (ref 5–15)
BUN: 28 mg/dL — AB (ref 6–20)
CHLORIDE: 104 mmol/L (ref 101–111)
CO2: 28 mmol/L (ref 22–32)
Calcium: 9.1 mg/dL (ref 8.9–10.3)
Creatinine, Ser: 0.85 mg/dL (ref 0.44–1.00)
GFR calc Af Amer: >60 mL/min (ref >60)
GFR, EST NON AFRICAN AMERICAN: 59 mL/min — AB (ref >60)
Glucose, Bld: 165 mg/dL — ABNORMAL HIGH (ref 65–99)
POTASSIUM: 3.5 mmol/L (ref 3.5–5.1)
SODIUM: 141 mmol/L (ref 135–145)

## 2015-07-31 LAB — FOLATE RBC
FOLATE, RBC: 991 ng/mL (ref >498)
Folate, Hemolysate: 335.8 ng/mL
HEMATOCRIT: 33.9 % — AB (ref 34.0–46.6)

## 2015-07-31 LAB — GLUCOSE, CAPILLARY: GLUCOSE-CAPILLARY: 133 mg/dL — AB (ref 65–99)

## 2015-07-31 MED ORDER — SODIUM CHLORIDE 0.9 % IV BOLUS (SEPSIS)
250.0000 mL | Freq: Once | INTRAVENOUS | Status: AC
  Administered 2015-07-31: 250 mL via INTRAVENOUS

## 2015-07-31 MED ORDER — FUROSEMIDE 10 MG/ML IJ SOLN
40.0000 mg | Freq: Two times a day (BID) | INTRAMUSCULAR | Status: DC
  Filled 2015-07-31: qty 4

## 2015-07-31 MED ORDER — LISINOPRIL 5 MG PO TABS
5.0000 mg | ORAL_TABLET | Freq: Every day | ORAL | Status: DC
  Administered 2015-07-31: 5 mg via ORAL
  Filled 2015-07-31: qty 1

## 2015-07-31 MED ORDER — METOPROLOL SUCCINATE ER 50 MG PO TB24
50.0000 mg | ORAL_TABLET | Freq: Every day | ORAL | Status: DC
  Administered 2015-08-01 – 2015-08-02 (×2): 50 mg via ORAL
  Filled 2015-07-31 (×2): qty 1

## 2015-07-31 MED ORDER — FUROSEMIDE 10 MG/ML IJ SOLN: 40.0000 mg | Freq: Every day | INTRAMUSCULAR | Status: DC

## 2015-07-31 NOTE — Care Management Important Message (Signed)
Important Message  Patient Details  Name: BILLY RIVIELLO MRN: [id number] Date of Birth: 1924-12-17   Medicare Important Message Given:  Yes    Channon Ambrosini P Haadiya Frogge 07/31/2015, 2:33 PM

## 2015-07-31 NOTE — Consult Note (Signed)
CARDIOLOGY CONSULT NOTE   Patient ID: Jessica Blackwell MRN: [id number], DOB/AGE: 79-07-1925   Admit date: 07/28/2015 Date of Consult: 07/31/2015 Reason for  Consult:   Primary Physician: Idelle Crouch, MD Primary Cardiologist: [facility] - Dr. Saralyn Pilar  HPI: Jessica Blackwell is a 81 y.o. female with PMH of HTN, GERD, chronic atrial fibrillation and chronic systolic CHF who presented to [facility] ED on 07/28/2015 for a fall she suffered earlier that night. She denies any loss of consciousness.  While in the ED, her O2 saturation was noted to be in the 80's at times. A CXR was obtained and showed cardiomegaly along with stable pulmonary arterial enlargement. BNP was noted to be elevated at 466. She has been diuresing well with Lasix 40mg  IV BID. Her net output has been -2.6L. Weight is down 6 lbs since admission. She reports her average weight is 155 pounds but says she has not weighed in months. She was 147lbs at time of admission.   She reports having dyspnea on exertion for the past few weeks. Denies any orthopnea, PND, or lower extremity edema. Says she sleeps in a recliner nightly since having shoulder surgery years ago.   Echocardiogram on 07/30/2015 showed EF  of 25% to 30%. From [facility] (at [facility]), her EF was 35% in 06/2014 with hypo-contractility noted diffusely. She had mild AR, moderate MR, moderate TR, moderate AS, and severe PR.   She currently lives by herself. Her daughter helps with ADL's as needed. She uses a walker for ambulation, Reports having 5-7 falls in the past year. She is followed by Dr. Saralyn Pilar.  Problem List  Past Medical History  Diagnosis Date  . Hypertension   . GERD (gastroesophageal reflux disease)   . CHF (congestive heart failure) ([place])   . H/O bladder infections   . Neuropathy ([place])     lower extrmities  . Arthritis   . Fall at home 07/28/2015    Past Surgical History  Procedure Laterality Date  . Colon surgery    . Rotator cuff repair  Left   . Abdominal hysterectomy    . Tonsillectomy    . Cataract extraction    . Foot surgery Right      Allergies  No Known Allergies    Inpatient Medications  . aspirin EC  81 mg Oral Daily  . enoxaparin (LOVENOX) injection  40 mg Subcutaneous Q24H  . ferrous sulfate  325 mg Oral TID WC  . furosemide  40 mg Intravenous Q12H  . gabapentin  300 mg Oral TID  . lisinopril  5 mg Oral Daily  . [START ON 08/01/2015] metoprolol succinate  50 mg Oral Daily  . pantoprazole  40 mg Oral Daily  . sodium chloride  3 mL Intravenous Q12H    Family History Family History  Problem Relation Age of Onset  . Hypertension Father      Social History Social History   Social History  . Marital Status: Widowed    Spouse Name: N/A  . Number of Children: N/A  . Years of Education: N/A   Occupational History  . Not on file.   Social History Main Topics  . Smoking status: Never Smoker   . Smokeless tobacco: Never Used  . Alcohol Use: No  . Drug Use: No  . Sexual Activity: Not on file   Other Topics Concern  . Not on file   Social History Narrative     Review of Systems General:  No chills, fever,  night sweats or weight changes. Positive for falls. Cardiovascular:  No chest pain, edema, orthopnea, palpitations, paroxysmal nocturnal dyspnea. Positive for dyspnea on exertion. Dermatological: No rash, lesions/masses Respiratory: No cough, dyspnea Urologic: No hematuria, dysuria Abdominal:   No nausea, vomiting, diarrhea, bright red blood per rectum, melena, or hematemesis Neurologic:  No visual changes, wkns, changes in mental status. All other systems reviewed and are otherwise negative except as noted above.  Physical Exam  Blood pressure 117/66, pulse 95, temperature 98.2 F (36.8 C), temperature source Oral, resp. rate 18, height 5\' 2"  (1.575 m), weight 141 lb 6.4 oz (64.139 kg), SpO2 94 %.  General: Pleasant, elderly Caucasian female in NAD Psych: Normal affect. Neuro:  Alert and oriented X 3. Moves all extremities spontaneously. HEENT: Normal  Neck: Supple without bruits or JVD. Lungs:  Resp regular and unlabored, CTA without wheezing or rales. Heart: RRR no s3, s4, 2/6 SEM auscultated best at LUSB. Abdomen: Soft, non-tender, non-distended, BS + x 4.  Extremities: No clubbing, cyanosis or edema. DP/PT/Radials 2+ and equal bilaterally.  Labs  No results for input(s): CKTOTAL, CKMB, TROPONINI in the last 72 hours. Lab Results  Component Value Date   WBC 9.0 07/28/2015   HGB 9.5* 07/28/2015   HCT 32.6* 07/28/2015   MCV 64.8* 07/28/2015   PLT 164 07/28/2015     Recent Labs Lab 07/31/15 0441  NA 141  K 3.5  CL 104  CO2 28  BUN 28*  CREATININE 0.85  CALCIUM 9.1  GLUCOSE 165*   Lab Results  Component Value Date   CHOL 162 06/04/2012   HDL 29* 06/04/2012   LDLCALC 103* 06/04/2012   TRIG 151 06/04/2012    Radiology/Studies  X-ray Chest Pa And Lateral: 07/28/2015  CLINICAL DATA:  Shortness of breath EXAM: CHEST  2 VIEW COMPARISON:  07/28/2015 FINDINGS: There is cardiomegaly. Marked enlargement of the main pulmonary artery, stable. No confluent airspace opacities or effusions. Chronic left upper lobe pleural based lesion again noted. IMPRESSION: Cardiomegaly.  Marked pulmonary arterial enlargement, stable. Pleural-based left upper lobe lesion again noted, unchanged. Electronically Signed   By: Rolm Baptise M.D.   On: 07/28/2015 11:00   Dg Chest 2 View: 07/28/2015  CLINICAL DATA:  Hypoxia EXAM: CHEST  2 VIEW COMPARISON:  01/15/2013 chest CT. 03/16/2014 chest x-ray not available FINDINGS: Chronic cardiomegaly and marked pulmonary artery enlargement. Diffuse interstitial coarsening without Kerley lines or pleural effusions. There is a 4 cm mass with left lateral indistinct appearance, correlating with pleural based mass 01/15/2013 by chest CT. This mass has shown indolent growth by CT since 2010, but is grossly stable compared to previous CT. 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 No pneumonia. No pneumothorax. IMPRESSION: 1. Cardiomegaly, pulmonary hypertension, and pulmonary venous congestion. 2. 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 Electronically Signed   By: Monte Fantasia M.D.   On: 07/28/2015 03:14   Ct Head Wo Contrast: 07/28/2015  CLINICAL DATA:  Fall with head injury and laceration EXAM: CT HEAD WITHOUT CONTRAST CT CERVICAL SPINE WITHOUT CONTRAST TECHNIQUE: Multidetector CT imaging of the head and cervical spine was performed following the standard protocol without intravenous contrast. Multiplanar CT image reconstructions of the cervical spine were also generated. COMPARISON:  03/29/2015 head CT FINDINGS: Scout image shows a left pulmonary nodule which correlates with pleural based mass on 01/15/2013 chest CT. No gross enlargement. CT HEAD FINDINGS Skull and Sinuses:High posterior scalp laceration without calvarial fracture. Orbits: Soft tissue nodule in  the anterior and nasal left orbit which is stable from prior and has been present since at least 2010, consistent with benign process. Morphology favors varix, although location is somewhat unusual. Bilateral cataract resection. Brain: No evidence of acute infarction, hemorrhage, hydrocephalus, or mass lesion/mass effect. Brain atrophy, asymmetrically advanced at the cerebellum and brainstem. Typical for age chronic small vessel ischemic change in the deep cerebral white matter. CT CERVICAL SPINE FINDINGS Negative for acute fracture or traumatic malalignment (C3-4 slight anterolisthesis appears degenerative). No prevertebral edema. No gross cervical canal hematoma. C4-5 and C5-6 degenerative disc disease with disc osteophyte complexes contacting the ventral cord. Diffuse uncovertebral spurring with multilevel foraminal stenosis, greatest at C4-5, C5-6, and C6-7. Imaging of the upper chest shows chronic enlargement of the main pulmonary artery consistent with  pulmonary hypertension. Visible portion measures 42 mm in diameter. There is also multi nodular enlargement of the thyroid gland with dominant nodule in the lower left pole measuring up to 4 cm, size stable from 01/15/2013 chest CT. IMPRESSION: 1. No evidence of acute intracranial or cervical spine injury. 2. Scalp laceration without calvarial fracture. 3. Multiple chronic incidental findings described above. Electronically Signed   By: Monte Fantasia M.D.   On: 07/28/2015 02:05   Ct Cervical Spine Wo Contrast: 07/28/2015  CLINICAL DATA:  Fall with head injury and laceration EXAM: CT HEAD WITHOUT CONTRAST CT CERVICAL SPINE WITHOUT CONTRAST TECHNIQUE: Multidetector CT imaging of the head and cervical spine was performed following the standard protocol without intravenous contrast. Multiplanar CT image reconstructions of the cervical spine were also generated. COMPARISON:  03/29/2015 head CT FINDINGS: Scout image shows a left pulmonary nodule which correlates with pleural based mass on 01/15/2013 chest CT. No gross enlargement. CT HEAD FINDINGS Skull and Sinuses:High posterior scalp laceration without calvarial fracture. Orbits: Soft tissue nodule in the anterior and nasal left orbit which is stable from prior and has been present since at least 2010, consistent with benign process. Morphology favors varix, although location is somewhat unusual. Bilateral cataract resection. Brain: No evidence of acute infarction, hemorrhage, hydrocephalus, or mass lesion/mass effect. Brain atrophy, asymmetrically advanced at the cerebellum and brainstem. Typical for age chronic small vessel ischemic change in the deep cerebral white matter. CT CERVICAL SPINE FINDINGS Negative for acute fracture or traumatic malalignment (C3-4 slight anterolisthesis appears degenerative). No prevertebral edema. No gross cervical canal hematoma. C4-5 and C5-6 degenerative disc disease with disc osteophyte complexes contacting the ventral cord.  Diffuse uncovertebral spurring with multilevel foraminal stenosis, greatest at C4-5, C5-6, and C6-7. Imaging of the upper chest shows chronic enlargement of the main pulmonary artery consistent with pulmonary hypertension. Visible portion measures 42 mm in diameter. There is also multi nodular enlargement of the thyroid gland with dominant nodule in the lower left pole measuring up to 4 cm, size stable from 01/15/2013 chest CT. IMPRESSION: 1. No evidence of acute intracranial or cervical spine injury. 2. Scalp laceration without calvarial fracture. 3. Multiple chronic incidental findings described above. Electronically Signed   By: Monte Fantasia M.D.   On: 07/28/2015 02:05    ECG: Sinus rhythm with known LBBB.  ECHO: 07/30/2015 Study Conclusions - Left ventricle: The cavity size was normal. Wall thickness was increased in a pattern of mild LVH. Systolic function was severely reduced. The estimated ejection fraction was in the range of 25% to 30%. - Aortic valve: AV is thckened, calcified By 2 D images appears to have mildly restricted motion. Mean gradient across the valve is 9 mm Hg  There was trivial regurgitation. - Left atrium: The atrium was moderately dilated. - Pulmonary arteries: PA peak pressure: 43 mm Hg (S). - Pericardium, extracardiac: A trivial pericardial effusion was identified.  Impressions: - Poor acoustic windows limit study.   ASSESSMENT AND PLAN  1. Acute on Chronic Systolic CHF - CXR on admission showed cardiomegaly along with stable pulmonary arterial enlargement. BNP elevated at 466 on 07/28/2015. - Echocardiogram on 07/30/2015 showed EF of 25% to 30%. Previous EF was 35% in 06/2014  - Has diuresed well with Lasix 40mg  IV BID. Net output of  -2.6L. Weight is down 6 lbs since admission.  - appears to be close to her dry weight. Consider switching from IV to PO Lasix 40mg  daily.   - continue BB and low-dose ACE-I.  2. Chronic Atrial Fibrillation This  patients CHA2DS2-VASc Score and unadjusted Ischemic Stroke Rate (% per year) is equal to 7.2 % stroke rate/year from a score of 5 (CHF, HTN, Female, Age > 76 x2). She does not wish to be on anticoagulation. Likely not a good candidate for anticoagulation due to history of frequent mechanical falls. - HR has been in 90's - 110's in the past 24 hours. Agree with increasing Toprol XL to 50mg  daily for better rate control.    Signed, Erma Heritage, PA-C 07/31/2015, 12:02 PM Pager: [phone]  I have personally seen and examined this patient with Bernerd Pho, PA-C. I agree with the assessment and plan as outlined above. Jessica Blackwell is known to have a cardiomyopathy. We were able to locate an echo from 2015 which showed LVEF=35% at [facility] (Dr. Saralyn Pilar). LVEF is now noted to be around 30% which is not likely a big change for her. She was volume overloaded this admission and has diuresed well. Would continue low dose Ace-inh and Beta blocker. She will likely require low dose Lasix on discharge. Would change to po Lasix now. Agree with increasing Toprol for better rate control. She has not been felt to be a good candidate for anti-coagulation with frequent falls. I would agree with this. She could be discharged tomorrow if stable. Follow up with Dr. Saralyn Pilar ([facility] at [facility].   Manjot Hinks 07/31/2015 12:16 PM

## 2015-07-31 NOTE — Progress Notes (Signed)
[facility] PROGRESS NOTE    Progress Note   Jessica Blackwell [id number] DOB: [birth year] DOA: 07/28/2015 PCP: Idelle Crouch, MD   Brief Narrative:   Jessica Blackwell is an 81 y.o. female past medical history of congestive heart failure who presents with a fall that happened after dinner, she never lost consciousness. She was brought to the ED and saturations were in the 80s which prompted a chest x-ray that showed cardiomegaly with heart failure and a BNP of 466.  Assessment/Plan:   Fall at home/scalp laceration: PT consult in, laceration was repaired in the ED. B-12 is greater than 7500 RBC folate is pending.  Acute respiratory failure with hypoxia due to acute systolic heart failure: Continues to have good urine output, continue IV Lasix Increase metoprolol. Strict I's and O's daily weight and restrict her fluid intake. 2-D echo showed an EF of 25-30% this is a drop compared to the previous echo report at [facility], consult cardiology.  GERD (gastroesophageal reflux disease) Cont Protonix.  Chronic atrial fibrillation: She does not want to be on oral anticoagulation, I will titrate her metoprolol as her heart rate has been in the 90s to 100.  Essential  HTN (hypertension): BP at goal continue IV Lasix and metoprolol.  Microcytic Anemia: Will need a follow-up with his PCP for her microcytic anemia, start her on oral iron.    DVT Prophylaxis - Lovenox ordered.  Family Communication: none Disposition Plan: Home 1-2 days Code Status:     Code Status Orders        Start     Ordered   07/28/15 0636  Full code   Continuous     07/28/15 0638    Advance Directive Documentation        Most Recent Value   Type of Advance Directive  Healthcare Power of Attorney   Pre-existing out of facility DNR order (yellow form or pink MOST form)     "MOST" Form in Place?          IV Access:    Peripheral IV   Procedures and diagnostic studies:   No results  found.   Medical Consultants:    None.  Anti-Infectives:   Anti-infectives    None      Subjective:    [name] she relates she feels better.  Objective:    Filed Vitals:   07/30/15 2055 07/30/15 2151 07/31/15 0319 07/31/15 0951  BP: 133/63 118/72 141/68 133/70  Pulse: 95 93 107 104  Temp: 98.6 F (37 C) 98.6 F (37 C) 98.4 F (36.9 C)   TempSrc: Oral Oral Oral   Resp: 20 20 19    Height:      Weight:   64.139 kg (141 lb 6.4 oz)   SpO2:  95% 97%     Intake/Output Summary (Last 24 hours) at 07/31/15 1026 Last data filed at 07/31/15 0927  Gross per 24 hour  Intake    720 ml  Output   1200 ml  Net   -480 ml   Filed Weights   07/29/15 0500 07/30/15 0550 07/31/15 0319  Weight: 69.673 kg (153 lb 9.6 oz) 65.545 kg (144 lb 8 oz) 64.139 kg (141 lb 6.4 oz)    Exam: Gen:  NAD Cardiovascular:  RRR, - JVD Chest and lungs:   CTAB Abdomen:  Abdomen soft, NT/ND, + BS Extremities:  No C/E/C   Data Reviewed:    Labs: Basic Metabolic Panel:  Recent Labs Lab 07/28/15 0328 07/28/15 2058  07/29/15 1045 07/30/15 0330 07/31/15 0441  NA 140  --  140 141 141  K 4.6  --  3.7 3.5 3.5  CL 106  --  104 106 104  CO2 26  --  28 27 28   GLUCOSE 147*  --  167* 131* 165*  BUN 15  --  22* 27* 28*  CREATININE 0.57  --  0.68 0.62 0.85  CALCIUM 8.9  --  9.0 8.9 9.1  MG  --  2.0  --   --   --    GFR Estimated Creatinine Clearance: 40.2 mL/min (by C-G formula based on Cr of 0.85). Liver Function Tests: No results for input(s): AST, ALT, ALKPHOS, BILITOT, PROT, ALBUMIN in the last 168 hours. No results for input(s): LIPASE, AMYLASE in the last 168 hours. No results for input(s): AMMONIA in the last 168 hours. Coagulation profile No results for input(s): INR, PROTIME in the last 168 hours.  CBC:  Recent Labs Lab 07/28/15 0328 07/28/15 0835  WBC 9.0  --   NEUTROABS 6.5  --   HGB 9.5* 9.5*  HCT 32.6* 32.6*  MCV 64.8*  --   PLT 164  --    Cardiac  Enzymes:  Recent Labs Lab 07/28/15 0328  TROPONINI 0.03   BNP (last 3 results) No results for input(s): PROBNP in the last 8760 hours. CBG: No results for input(s): GLUCAP in the last 168 hours. D-Dimer: No results for input(s): DDIMER in the last 72 hours. Hgb A1c: No results for input(s): HGBA1C in the last 72 hours. Lipid Profile: No results for input(s): CHOL, HDL, LDLCALC, TRIG, CHOLHDL, LDLDIRECT in the last 72 hours. Thyroid function studies: No results for input(s): TSH, T4TOTAL, T3FREE, THYROIDAB in the last 72 hours.  Invalid input(s): FREET3 Anemia work up:  Recent Labs  07/28/15 1100  VITAMINB12 >7500*   Sepsis Labs:  Recent Labs Lab 07/28/15 0328  WBC 9.0   Microbiology No results found for this or any previous visit (from the past 240 hour(s)).   Medications:   . aspirin EC  81 mg Oral Daily  . enoxaparin (LOVENOX) injection  40 mg Subcutaneous Q24H  . ferrous sulfate  325 mg Oral TID WC  . furosemide  40 mg Intravenous Q12H  . gabapentin  300 mg Oral TID  . metoprolol succinate  25 mg Oral Daily  . pantoprazole  40 mg Oral Daily  . sodium chloride  3 mL Intravenous Q12H   Continuous Infusions:   Time spent: 15 min   LOS: 3 days   Charlynne Cousins  [facility] Pager [phone]  *Please refer to [email or web address], password [email or web address] to get updated schedule on who will round on this patient, as hospitalists switch teams weekly. If 7PM-7AM, please contact night-coverage at [email or web address], password [email or web address] for any overnight needs.  07/31/2015, 10:26 AM

## 2015-07-31 NOTE — Progress Notes (Signed)
Physical Therapy Treatment Patient Details Name: Jessica Blackwell MRN: [id number] DOB: 12/17/1924 Today's Date: 07/31/2015    History of Present Illness Patient is a 81 y/o female presents s/p fall at home, lacertation done in the ED.  Found to have saturations in the 80s which prompted a chest x-ray that showed cardiomegaly with heart failure and a BNP of 466. PMh includes HTN,  CHF, neuropathy.    PT Comments    Pt was able to walk a short trip with PT with more controlled pulses and low endurance.  However, her cardiologist has cleared her of those issues being a source of recent fall.  Will continue to see her for strengthening and balance/gait control.  Follow Up Recommendations  SNF     Equipment Recommendations  None recommended by PT    Recommendations for Other Services       Precautions / Restrictions Precautions Precautions: Fall Precaution Comments: chair alarm Restrictions Weight Bearing Restrictions: No    Mobility  Bed Mobility               General bed mobility comments: up when PT entered  Transfers Overall transfer level: Needs assistance Equipment used: Rolling walker (2 wheeled) Transfers: Sit to/from [facility] Sit to Stand: Min assist Stand pivot transfers: Min assist;Min guard       General transfer comment: cued hand placement for pt to assist in power up  Ambulation/Gait Ambulation/Gait assistance: Min guard;Min assist Ambulation Distance (Feet): 80 Feet Assistive device: Rolling walker (2 wheeled) Gait Pattern/deviations: Step-through pattern;Wide base of support;Trunk flexed;Drifts right/left (struggling to stay inside walker) Gait velocity: decreased Gait velocity interpretation: Below normal speed for age/gender General Gait Details: reduced speed with difficulty controlling direction   Stairs            Wheelchair Mobility    Modified Rankin (Stroke Patients Only)       Balance Overall balance  assessment: Needs assistance Sitting-balance support: Feet supported Sitting balance-Leahy Scale: Fair   Postural control: Posterior lean Standing balance support: Bilateral upper extremity supported Standing balance-Leahy Scale: Poor Standing balance comment: somewhat low endurance to stand and walk per pt                    Cognition Arousal/Alertness: Awake/alert Behavior During Therapy: WFL for tasks assessed/performed Overall Cognitive Status: Within Functional Limits for tasks assessed                      Exercises General Exercises - Lower Extremity Ankle Circles/Pumps: AROM;Both;5 reps Long Arc Quad: Strengthening;Both;10 reps Heel Slides: Strengthening;Both;10 reps Hip ABduction/ADduction: Strengthening;Both;10 reps    General Comments General comments (skin integrity, edema, etc.): Pt has SOB with walking exertion but not so difficult that she cannot do this.  Has to control pace then could go from pulse 93 to pulse 107 and down to 90 after resting       Pertinent Vitals/Pain Pain Assessment: No/denies pain    Home Living                      Prior Function            PT Goals (current goals can now be found in the care plan section) Progress towards PT goals: Progressing toward goals    Frequency  Min 3X/week    PT Plan Current plan remains appropriate    Co-evaluation  End of Session Equipment Utilized During Treatment: Gait belt Activity Tolerance: Patient limited by fatigue Patient left: in chair;with call bell/phone within reach;with chair alarm set     Time: [id number] PT Time Calculation (min) (ACUTE ONLY): 29 min  Charges:  $Gait Training: 8-22 mins $Therapeutic Exercise: 8-22 mins                    G Codes:      Ramond Dial 14-Aug-2015, 1:23 PM   Mee Hives, PT MS Acute Rehab Dept. Number: [facility] [id number] and [place] [phone]

## 2015-07-31 NOTE — Progress Notes (Addendum)
Making hourly rounds on patient, pt family had concerns about pt LOC and complaints of dizziness. BP taken automatically as well as manually, Pt hypotensive. Dr. Aileen Fass paged, Bernerd Pho PA with cardiology paged. Lisinopril d/c, lasix to be held tonight. 250 cc bolus given.   1800 pt BP rechecked and stable, pt ate dinner sitting at bedside, pt much more alert and responsive. No complaints of dizziness at this time.

## 2015-08-01 DIAGNOSIS — R55 Syncope and collapse: Secondary | ICD-10-CM

## 2015-08-01 DIAGNOSIS — I4729 Other ventricular tachycardia: Secondary | ICD-10-CM | POA: Insufficient documentation

## 2015-08-01 DIAGNOSIS — I472 Ventricular tachycardia, unspecified: Secondary | ICD-10-CM | POA: Insufficient documentation

## 2015-08-01 DIAGNOSIS — N179 Acute kidney failure, unspecified: Secondary | ICD-10-CM

## 2015-08-01 DIAGNOSIS — I4819 Other persistent atrial fibrillation: Secondary | ICD-10-CM | POA: Insufficient documentation

## 2015-08-01 LAB — BASIC METABOLIC PANEL
ANION GAP: 10 (ref 5–15)
BUN: 41 mg/dL — AB (ref 6–20)
CALCIUM: 8.6 mg/dL — AB (ref 8.9–10.3)
CO2: 24 mmol/L (ref 22–32)
Chloride: 104 mmol/L (ref 101–111)
Creatinine, Ser: 1.27 mg/dL — ABNORMAL HIGH (ref 0.44–1.00)
GFR calc Af Amer: 42 mL/min — ABNORMAL LOW (ref >60)
GFR calc non Af Amer: 37 mL/min — ABNORMAL LOW (ref >60)
GLUCOSE: 151 mg/dL — AB (ref 65–99)
Potassium: 3.7 mmol/L (ref 3.5–5.1)
Sodium: 138 mmol/L (ref 135–145)

## 2015-08-01 LAB — MAGNESIUM: Magnesium: 1.9 mg/dL (ref 1.7–2.4)

## 2015-08-01 MED ORDER — FUROSEMIDE 20 MG PO TABS
20.0000 mg | ORAL_TABLET | Freq: Every day | ORAL | Status: DC
  Administered 2015-08-01 – 2015-08-02 (×2): 20 mg via ORAL
  Filled 2015-08-01 (×2): qty 1

## 2015-08-01 NOTE — Progress Notes (Signed)
Heart Failure Navigator Consult Note  Presentation: Jessica Blackwell has a past medical history of congestive heart failure, hypertension, GERD, and gait disturbance; who presents after having a fall tonight around 10:30 PM. Patient notes that she was trying to get a bowl of oatmeal and fell backwards hitting her head. Patient never lost consciousness she is able to use her life alert button and to call for help. She notes that she had her walker beside her. Patient patient about her use of tramadol and she states that she last taken and 9 PM that night.That she may have taken 3 tramadol pills total for the day. She suffered a laceration to her head for which she was put on the floor. EMS brought her to the emergency department and she required 3 stitches to the parietal lobe. CT scans were negative for any intracranial abnormalities. However, patient was seen to have decreased oxygen saturations into the 80s on room air which prompted a chest x-ray which showed cardiomegaly and pulmonary congestion. Further workup revealed a BNP of 466. Patient was given 40 mg of IV Lasix and recommended to be admitted for at least observation. Patient states that 2 weeks ago she went to see her PCP and had noted that she was short of breath with exertion. A chest x-ray was taken and she has a follow-up appointment on 08/01/2015 with her PCP to have a repeat chest x-ray. Patient notes that she has a cardiologist and previously had been on Lasix for short period in time but that was discontinued-therapist told her that she no longer required it. Patient does not regularly check her weight, but feels that it stayed about the same.   Past Medical History  Diagnosis Date  . Hypertension   . GERD (gastroesophageal reflux disease)   . CHF (congestive heart failure) ([place])   . H/O bladder infections   . Neuropathy ([place])     lower extrmities  . Arthritis   . Fall at home 07/28/2015    Social History   Social History  .  Marital Status: Widowed    Spouse Name: N/A  . Number of Children: N/A  . Years of Education: N/A   Social History Main Topics  . Smoking status: Never Smoker   . Smokeless tobacco: Never Used  . Alcohol Use: No  . Drug Use: No  . Sexual Activity: Not Asked   Other Topics Concern  . None   Social History Narrative    ECHO:Study Conclusions-07/30/15  - Left ventricle: The cavity size was normal. Wall thickness was increased in a pattern of mild LVH. Systolic function was severely reduced. The estimated ejection fraction was in the range of 25% to 30%. - Aortic valve: AV is thckened, calcified By 2 D images appears to have mildly restricted motion. Mean gradient across the valve is 9 mm Hg There was trivial regurgitation. - Left atrium: The atrium was moderately dilated. - Pulmonary arteries: PA peak pressure: 43 mm Hg (S). - Pericardium, extracardiac: A trivial pericardial effusion was identified.  Impressions:  - Poor acoustic windows limit study.  Transthoracic echocardiography. M-mode, complete 2D, spectral Doppler, and color Doppler. Birthdate: Patient birthdate: Jan 04, 1925. Age: Patient is 81 yr old. Sex: Gender: female. BMI: 26.3 kg/m^2. Blood pressure:   137/56 Patient status: Inpatient. Study date: Study date: 07/30/2015. Study time: 11:49 AM. Location: Bedside. BNP    Component Value Date/Time   BNP 466.5* 07/28/2015 0328    ProBNP No results found for: PROBNP   Education  Assessment and Provision:  Detailed education and instructions provided on heart failure disease management including the following:  Signs and symptoms of Heart Failure When to call the physician Importance of daily weights Low sodium diet Fluid restriction Medication management Anticipated future follow-up appointments  Patient education given on each of the above topics.  Patient acknowledges understanding and acceptance of all instructions.  I  spoke at length with patient and granddaughter regarding her HF.  Per her echo from approx 1 year ago at [facility] her EF is roughly unchanged.  She does have a scale and currently does not weigh everyday.  I have encouraged daily weights and reinforced how weight increases relate to signs and symptoms of HF.  She admits to using table salt and having a high sodium diet.  We reviewed a low sodium diet and high sodium foods to avoid--her granddaughter says that her mother will be staying with the patient at discharge for a "while" and has plans to help her with dietary changes.  She follows as an outpatient in [place] with a cardiologist and PCP.  I have encouraged the granddaughter to make appts within 10 days of discharge.  Education Materials:  "Living Better With Heart Failure" Booklet, Daily Weight Tracker Tool    High Risk Criteria for Readmission and/or Poor Patient Outcomes   EF <30%- 25-30%  2 or more admissions in 6 months- No  Difficult social situation- No  Demonstrates medication noncompliance- No    Barriers of Care:  Knowledge and compliance  Discharge Planning:   Plans to discharge to home alone.  She has daughter and granddaughter who live locally and can provide support as needed.

## 2015-08-01 NOTE — Research (Signed)
Reds@ Discharge Informed Consent   Subject Name: Jessica Blackwell  Subject met inclusion and exclusion criteria.  The informed consent form, study requirements and expectations were reviewed with the subject and questions and concerns were addressed prior to the signing of the consent form.  The subject verbalized understanding of the trail requirements.  The subject agreed to participate in the REDS@ Discharge trial and signed the informed consent.  The informed consent was obtained prior to performance of any protocol-specific procedures for the subject.  A copy of the signed informed consent was given to the subject and a copy was placed in the subject's medical record.  Sandie Ano 08/01/2015, 2:49 PM

## 2015-08-01 NOTE — Progress Notes (Signed)
[facility] PROGRESS NOTE    Progress Note   Jessica Blackwell [id number] DOB: 04-06-1925 DOA: 07/28/2015 PCP: Idelle Crouch, MD   Brief Narrative:   Jessica Blackwell is an 81 y.o. female past medical history of congestive heart failure who presents with a fall that happened after dinner, she never lost consciousness. She was brought to the ED and saturations were in the 80s which prompted a chest x-ray that showed cardiomegaly with heart failure and a BNP of 466.  Assessment/Plan:   Fall at home/scalp laceration: PT consult in, laceration was repaired in the ED.  Acute respiratory failure with hypoxia due to acute systolic heart failure: Continues to have good urine output, Hold IV lasix. Will d/w cardiology to increase metoprolol as she had an episode of V-tach. Strict I's and O's daily weight and restrict her fluid intake. 2-D echo showed an EF of 25-30% this is a drop compared to the previous echo report at [facility]. Appriciate cardiology assistance.  GERD (gastroesophageal reflux disease) Cont Protonix.  Chronic atrial fibrillation/V-tach: She does not want to be on oral anticoagulation, I will titrate her metoprolol as her heart rate has been in the 90s to 100. Check a magnesium level titrate metoprolol.  AKI: In the setting of hypotension and ACE inhibitor likely prerenal her ACE inhibitor was DC'd. She was given a 250 mL bolus and continue to monitor. Recheck a basic metabolic panel in the morning.  Essential  HTN (hypertension): Off Lasix on metoprolol.  Microcytic Anemia: Will need a follow-up with his PCP for her microcytic anemia, start her on oral iron.    DVT Prophylaxis - Lovenox ordered.  Family Communication: none Disposition Plan: Home 1-2 days Code Status:     Code Status Orders        Start     Ordered   07/28/15 0636  Full code   Continuous     07/28/15 0638    Advance Directive Documentation        Most Recent Value   Type of  Advance Directive  Healthcare Power of Attorney   Pre-existing out of facility DNR order (yellow form or pink MOST form)     "MOST" Form in Place?          IV Access:    Peripheral IV   Procedures and diagnostic studies:   No results found.   Medical Consultants:    None.  Anti-Infectives:   Anti-infectives    None      Subjective:    Jessica Blackwell she relates she feels better. Wants to go home.  Objective:    Filed Vitals:   07/31/15 2158 08/01/15 0004 08/01/15 0558 08/01/15 0924  BP: 99/48 99/48 125/63 108/60  Pulse: 98 95 98 101  Temp: 98.3 F (36.8 C) 98.1 F (36.7 C) 98 F (36.7 C)   TempSrc: Oral Oral Oral   Resp: 18 18 18    Height:      Weight:   64.701 kg (142 lb 10.2 oz)   SpO2: 92% 92% 92%     Intake/Output Summary (Last 24 hours) at 08/01/15 1043 Last data filed at 08/01/15 0912  Gross per 24 hour  Intake   1040 ml  Output    651 ml  Net    389 ml   Filed Weights   07/30/15 0550 07/31/15 0319 08/01/15 0558  Weight: 65.545 kg (144 lb 8 oz) 64.139 kg (141 lb 6.4 oz) 64.701 kg (142 lb 10.2 oz)  Exam: Gen:  NAD Cardiovascular:  RRR, - JVD Chest and lungs:   CTAB Abdomen:  Abdomen soft, NT/ND, + BS Extremities:  No C/E/C   Data Reviewed:    Labs: Basic Metabolic Panel:  Recent Labs Lab 07/28/15 0328 07/28/15 2058 07/29/15 1045 07/30/15 0330 07/31/15 0441 08/01/15 0458  NA 140  --  140 141 141 138  K 4.6  --  3.7 3.5 3.5 3.7  CL 106  --  104 106 104 104  CO2 26  --  28 27 28 24   GLUCOSE 147*  --  167* 131* 165* 151*  BUN 15  --  22* 27* 28* 41*  CREATININE 0.57  --  0.68 0.62 0.85 1.27*  CALCIUM 8.9  --  9.0 8.9 9.1 8.6*  MG  --  2.0  --   --   --   --    GFR Estimated Creatinine Clearance: 27 mL/min (by C-G formula based on Cr of 1.27). Liver Function Tests: No results for input(s): AST, ALT, ALKPHOS, BILITOT, PROT, ALBUMIN in the last 168 hours. No results for input(s): LIPASE, AMYLASE in the last 168  hours. No results for input(s): AMMONIA in the last 168 hours. Coagulation profile No results for input(s): INR, PROTIME in the last 168 hours.  CBC:  Recent Labs Lab 07/28/15 0328 07/28/15 0835 07/28/15 1100  WBC 9.0  --   --   NEUTROABS 6.5  --   --   HGB 9.5* 9.5*  --   HCT 32.6* 32.6* 33.9*  MCV 64.8*  --   --   PLT 164  --   --    Cardiac Enzymes:  Recent Labs Lab 07/28/15 0328  TROPONINI 0.03   BNP (last 3 results) No results for input(s): PROBNP in the last 8760 hours. CBG:  Recent Labs Lab 07/31/15 1606  GLUCAP 133*   D-Dimer: No results for input(s): DDIMER in the last 72 hours. Hgb A1c: No results for input(s): HGBA1C in the last 72 hours. Lipid Profile: No results for input(s): CHOL, HDL, LDLCALC, TRIG, CHOLHDL, LDLDIRECT in the last 72 hours. Thyroid function studies: No results for input(s): TSH, T4TOTAL, T3FREE, THYROIDAB in the last 72 hours.  Invalid input(s): FREET3 Anemia work up: No results for input(s): VITAMINB12, FOLATE, FERRITIN, TIBC, IRON, RETICCTPCT in the last 72 hours. Sepsis Labs:  Recent Labs Lab 07/28/15 0328  WBC 9.0   Microbiology No results found for this or any previous visit (from the past 240 hour(s)).   Medications:   . aspirin EC  81 mg Oral Daily  . enoxaparin (LOVENOX) injection  40 mg Subcutaneous Q24H  . ferrous sulfate  325 mg Oral TID WC  . furosemide  20 mg Oral Daily  . gabapentin  300 mg Oral TID  . metoprolol succinate  50 mg Oral Daily  . pantoprazole  40 mg Oral Daily  . sodium chloride  3 mL Intravenous Q12H   Continuous Infusions:   Time spent: 15 min   LOS: 4 days   Charlynne Cousins  [facility] Pager [phone]  *Please refer to [email or web address], password [email or web address] to get updated schedule on who will round on this patient, as hospitalists switch teams weekly. If 7PM-7AM, please contact night-coverage at [email or web address], password [email or web address] for any overnight needs.  08/01/2015, 10:43 AM

## 2015-08-01 NOTE — Progress Notes (Signed)
Paged Bernerd Pho PA regarding pt having several runs of vtach this AM. Pt vitals stable and has no complaints at this time. Will continue to monitor.

## 2015-08-01 NOTE — Progress Notes (Signed)
Patient Name: Jessica Blackwell Date of Encounter: 08/01/2015  Principal Problem:   Fall at home Active Problems:   Acute respiratory failure with hypoxia (HCC)   Pulmonary congestion   Laceration of right parietal lobe (HCC)   Hypoxia   GERD (gastroesophageal reflux disease)   HTN (hypertension)   Anemia   Acute systolic congestive heart failure [facility])     Primary Cardiologist: [facility] - Dr. Saralyn Pilar Patient Profile: 80 y.o. female w/ PMH of HTN, GERD, chronic atrial fibrillation and chronic systolic CHF (EF [id number] in [id number]) who presented to [facility] ED on 07/28/2015 for a fall she suffered earlier that night. Echo on 07/30/2015 showed slightly reduced EF of 25-30%.   SUBJECTIVE: Denies any chest pain, palpitations, or shortness of breath. Reports feeling well this morning and wants to go home.  OBJECTIVE Filed Vitals:   07/31/15 2158 08/01/15 0004 08/01/15 0558 08/01/15 0924  BP: 99/48 99/48 125/63 108/60  Pulse: 98 95 98 101  Temp: 98.3 F (36.8 C) 98.1 F (36.7 C) 98 F (36.7 C)   TempSrc: Oral Oral Oral   Resp: 18 18 18    Height:      Weight:   142 lb 10.2 oz (64.701 kg)   SpO2: 92% 92% 92%     Intake/Output Summary (Last 24 hours) at 08/01/15 1038 Last data filed at 08/01/15 0912  Gross per 24 hour  Intake   1040 ml  Output    651 ml  Net    389 ml   Filed Weights   07/30/15 0550 07/31/15 0319 08/01/15 0558  Weight: 144 lb 8 oz (65.545 kg) 141 lb 6.4 oz (64.139 kg) 142 lb 10.2 oz (64.701 kg)    PHYSICAL EXAM General: Well developed, well nourished, female in no acute distress. Head: Normocephalic, atraumatic.  Neck: Supple without bruits, JVD not elevated. Lungs:  Resp regular and unlabored, CTA without wheezing or rales. Heart: RRR, S1, S2, no S3, S4, 2/6 SEM at LUSB; no rub. Abdomen: Soft, non-tender, non-distended with normoactive bowel sounds. No hepatomegaly. No rebound/guarding. No obvious abdominal masses. Extremities: No clubbing, cyanosis, or  edema. Distal pedal pulses are 2+ bilaterally. Neuro: Alert and oriented X 3. Moves all extremities spontaneously. Psych: Normal affect.   LABS: Basic Metabolic Panel: Recent Labs  07/31/15 0441 08/01/15 0458  NA 141 138  K 3.5 3.7  CL 104 104  CO2 28 24  GLUCOSE 165* 151*  BUN 28* 41*  CREATININE 0.85 1.27*  CALCIUM 9.1 8.6*   BNP:  B NATRIURETIC PEPTIDE  Date/Time Value Ref Range Status  07/28/2015 03:28 AM 466.5* 0.0 - 100.0 pg/mL Final   TELE:  Atrial fibrillation with rate in 90's - low 110's. Asymptomatic NSVT for 7-13 beats this AM.      ECHO: 07/30/2015 Study Conclusions - Left ventricle: The cavity size was normal. Wall thickness was increased in a pattern of mild LVH. Systolic function was severely reduced. The estimated ejection fraction was in the range of 25% to 30%. - Aortic valve: AV is thckened, calcified By 2 D images appears to have mildly restricted motion. Mean gradient across the valve is 9 mm Hg There was trivial regurgitation. - Left atrium: The atrium was moderately dilated. - Pulmonary arteries: PA peak pressure: 43 mm Hg (S). - Pericardium, extracardiac: A trivial pericardial effusion was identified.  Impressions: - Poor acoustic windows limit study.  Current Medications:  . aspirin EC  81 mg Oral Daily  . enoxaparin (LOVENOX) injection  40 mg Subcutaneous Q24H  . ferrous sulfate  325 mg Oral TID WC  . furosemide  20 mg Oral Daily  . gabapentin  300 mg Oral TID  . metoprolol succinate  50 mg Oral Daily  . pantoprazole  40 mg Oral Daily  . sodium chloride  3 mL Intravenous Q12H      ASSESSMENT AND PLAN:  1. Acute on Chronic Systolic CHF - CXR on admission showed cardiomegaly along with stable pulmonary arterial enlargement. BNP elevated at 466 on 07/28/2015. - Echocardiogram on 07/30/2015 showed EF of 25% to 30%. Previous EF was 35% in 06/2014  - Has diuresed well with Lasix 40mg  IV BID. Net output of -2.2L. Weight is  down 6 lbs since admission.  - appears to be close to her dry weight. Has been switched to PO Lasix 20mg  daily. - continue BB. She did not tolerate low dose Ace-inh.   2. Chronic Atrial Fibrillation - This patients CHA2DS2-VASc Score and unadjusted Ischemic Stroke Rate (% per year) is equal to 7.2 % stroke rate/year from a score of 5 (CHF, HTN, Female, Age > 64 x2). She does not wish to be on anticoagulation. Likely not a good candidate for anticoagulation due to history of frequent mechanical falls. - HR has been in 90's - 110's in the past 24 hours. Currently on Toprol-XL 50mg .   3. NSVT - 7-13 beats of asymptomatic NSVT this morning. - potassium normal at 3.7. Will recheck Magnesium level. - her Toprol-XL dosage was increased in the system on 07/31/2015, however she received the 25mg  Toprol-XL on 07/31/2015 and this morning was her first time receiving the 50mg  Toprol-XL dose. Would not increase dosage at this time due to her just now receiving the increased dose. Had a long discussion with the patient's daughter and granddaughter about her medication change and they are in agreement with this plan.  4. Hypotension - BP has been 77/48 - 125/66 in the past 24 hours. Lisinopril was discontinued and Lasix switched from IV to PO. - will need to continue to monitor closely with Metoprolol increase.  Jessica Blackwell , PA-C 10:38 AM 08/01/2015 Pager: [phone]  I have personally seen and examined this patient with Bernerd Pho, PA-C. I agree with the assessment and plan as outlined above. Her cardiomyopathy is chronic. We tried to start Ace-inh but she did not tolerate with hypotension. Will continue beta blocker. She does not wish to start anti-coagulation for her atrial fib which is rate controlled on current therapy. NSVT this am. As above, will titrate beta blocker. No further workup at this time. Can be discharged today.   Jessica Blackwell 08/01/2015 11:28  AM

## 2015-08-01 NOTE — Care Management Note (Signed)
Case Management Note  Patient Details  Name: Jessica Blackwell MRN: [id number] Date of Birth: 1924/09/29  Subjective/Objective:     Admitted post fall at home               Action/Plan: Talked to patient with daughters present about [facility] choices, pt/ daughter chose [facility]. [name] with [facility] made aware of referral. Patient has a rolling walker and Life Alert system at home. Attending MD at discharge please enter the face to face medicare document for [facility] services at discharge. DC summary to be faxed to her PCP at discharge.  Expected Discharge Date:   possibly 08/02/2015               Expected Discharge Plan:  [place]    Choice offered to:  Patient and daughter  HH Arranged:  RN, PT, OT, Nurse's Aide [place] Agency:  [place]  Status of Service:  In process, will continue to follow  Sherrilyn Rist [phone] 08/01/2015, 11:18 AM

## 2015-08-01 NOTE — Progress Notes (Signed)
Upon assessment of patient, she stated her pain was a 5/10 to her mid back. This has been a continued complaint in addition to some c/o "generalized" pain from decreased mobility. RN attempted to give patient ordered PRN Ultram for pain. Patient's daughter was very upset and aggressive with RN stating that "yall are giving her medications that aren't ordered and this is not the end of this. You will hear from my attorney." RN attempted to explain to daughter that all medications that are given to patient are ordered by MD, that no medications that arent ordered can be given. Patient's daughter continued to call family and was visible upset about patient receiving medication, specifically Ultram which is ordered for pain. Will continue to monitor.  Tresa Endo

## 2015-08-02 DIAGNOSIS — I5021 Acute systolic (congestive) heart failure: Secondary | ICD-10-CM

## 2015-08-02 DIAGNOSIS — I481 Persistent atrial fibrillation: Secondary | ICD-10-CM

## 2015-08-02 DIAGNOSIS — R0902 Hypoxemia: Secondary | ICD-10-CM

## 2015-08-02 DIAGNOSIS — J9601 Acute respiratory failure with hypoxia: Secondary | ICD-10-CM

## 2015-08-02 DIAGNOSIS — I10 Essential (primary) hypertension: Secondary | ICD-10-CM

## 2015-08-02 DIAGNOSIS — I472 Ventricular tachycardia: Secondary | ICD-10-CM

## 2015-08-02 DIAGNOSIS — N179 Acute kidney failure, unspecified: Secondary | ICD-10-CM

## 2015-08-02 LAB — BASIC METABOLIC PANEL
ANION GAP: 9 (ref 5–15)
BUN: 34 mg/dL — ABNORMAL HIGH (ref 6–20)
CALCIUM: 8.8 mg/dL — AB (ref 8.9–10.3)
CO2: 27 mmol/L (ref 22–32)
Chloride: 103 mmol/L (ref 101–111)
Creatinine, Ser: 0.82 mg/dL (ref 0.44–1.00)
Glucose, Bld: 147 mg/dL — ABNORMAL HIGH (ref 65–99)
POTASSIUM: 3.7 mmol/L (ref 3.5–5.1)
Sodium: 139 mmol/L (ref 135–145)

## 2015-08-02 LAB — GLUCOSE, CAPILLARY: Glucose-Capillary: 223 mg/dL — ABNORMAL HIGH (ref 65–99)

## 2015-08-02 MED ORDER — FUROSEMIDE 20 MG PO TABS: 20.0000 mg | ORAL_TABLET | Freq: Every day | ORAL | Status: DC

## 2015-08-02 MED ORDER — METOPROLOL SUCCINATE ER 50 MG PO TB24: 50.0000 mg | ORAL_TABLET | Freq: Every day | ORAL | Status: DC

## 2015-08-02 MED ORDER — FERROUS SULFATE 325 (65 FE) MG PO TABS: 325.0000 mg | ORAL_TABLET | Freq: Three times a day (TID) | ORAL | Status: DC

## 2015-08-02 MED ORDER — ACETAMINOPHEN 325 MG PO TABS
ORAL_TABLET | ORAL | Status: AC
  Filled 2015-08-02: qty 2

## 2015-08-02 MED ORDER — ACETAMINOPHEN 325 MG PO TABS
650.0000 mg | ORAL_TABLET | Freq: Four times a day (QID) | ORAL | Status: DC | PRN
  Administered 2015-08-02: 650 mg via ORAL

## 2015-08-02 NOTE — Progress Notes (Signed)
Orders received for pt discharge.  Discharge summary printed and reviewed with pt.  Explained medication regimen, and pt had no further questions at this time.  IV removed and site remains clean, dry, intact.  Telemetry removed.  Pt in stable condition and awaiting transport. 

## 2015-08-02 NOTE — Discharge Summary (Addendum)
Physician Discharge Summary  Jessica Blackwell [id number] DOB: Feb 11, 1925 DOA: 07/28/2015  PCP: Idelle Crouch, MD  Admit date: 07/28/2015 Discharge date: 08/02/2015  Time spent: 60 minutes  Recommendations for Outpatient Follow-up:  1. Follow up with cardiology next week-will need a basic metabolic panel 2. Recommended to go to skilled nursing facility based on PT eval however patient prefers to go home-she is hesitant to accept home health but has been convinced to do so by her family  Discharge Condition: Stable  Discharge Diagnoses:  Principal Problem:   Acute respiratory failure with hypoxia ([place]) Active Problems:   Fall at home   GERD (gastroesophageal reflux disease)   HTN (hypertension)   Anemia   Acute systolic congestive heart failure ([place])   AKI (acute kidney injury) ([place])   V-tach ([place])   NSVT (nonsustained ventricular tachycardia) (HCC)   Persistent atrial fibrillation (H[facility])   History of present illness:  81 year old female with a history of chronic systolic heart failure who presented to the hospital after a fall. In the ER she was noted to have an oxygen saturation level in the 80s and a chest x-ray consistent with pulmonary edema. She also had a laceration to her scalp that was repaired in the ER. She was admitted for further treatment.  Hospital Course:  Acute on chronic systolic heart failure/acute respiratory failure with hypoxia - Aggressively diuresed. Weight improved from 70 kg to 65 kg - 2-D echo reveals an EF of 25-30%-previous EF was 35% when last checked in 10/15 - Will need to be discharged home on Lasix 20 mg daily with potassium replacement. - ACE inhibitor was started however, the patient became hypotensive and therefore it was discontinued - Continue beta blocker-Toprol dosage has been increased  Chronic atrial fibrillation - CHA2DS2-VASc Score 5 - Due to continued tachycardia and an episode of ventricular tachycardia, Toprol has been  increased from 25-50 mg daily -The patient has declined anticoagulation -that she presented to the hospital with a mechanical fall - baby aspirin daily  Nonsustained V. Tach -See above-Toprol dose increased-electrolytes are stable  Fall on admission with laceration to the scalp -Appears to be healing well  Hypotension -ACE inhibitor was discontinued and Lasix was held temporarily with improvement in blood pressure  Procedures: 2 D ECHO  - Left ventricle: The cavity size was normal. Wall thickness was increased in a pattern of mild LVH. Systolic function was severely reduced. The estimated ejection fraction was in the range of 25% to 30%. - Aortic valve: AV is thckened, calcified By 2 D images appears to have mildly restricted motion. Mean gradient across the valve is 9 mm Hg There was trivial regurgitation. - Left atrium: The atrium was moderately dilated. - Pulmonary arteries: PA peak pressure: 43 mm Hg (S). - Pericardium, extracardiac: A trivial pericardial effusion was identified. - Poor acoustic windows limit study.  Consultations:  cardiology  Discharge Exam: Filed Weights   07/31/15 0319 08/01/15 0558 08/02/15 0446  Weight: 64.139 kg (141 lb 6.4 oz) 64.701 kg (142 lb 10.2 oz) 65.4 kg (144 lb 2.9 oz)   Filed Vitals:   08/02/15 1035  BP: 115/56  Pulse: 87  Temp:   Resp:     General: AAO x 3, no distress Cardiovascular: RRR, no murmurs - 2/6 murmur RUS border Respiratory: clear to auscultation bilaterally GI: soft, non-tender, non-distended, bowel sound positive  Discharge Instructions You were cared for by a hospitalist during your hospital stay. If you have any questions about your discharge medications or  the care you received while you were in the hospital after you are discharged, you can call the unit and asked to speak with the hospitalist on call if the hospitalist that took care of you is not available. Once you are discharged, your primary  care physician will handle any further medical issues. Please note that NO REFILLS for any discharge medications will be authorized once you are discharged, as it is imperative that you return to your primary care physician (or establish a relationship with a primary care physician if you do not have one) for your aftercare needs so that they can reassess your need for medications and monitor your lab values.      Discharge Instructions    Diet - low sodium heart healthy    Complete by:  As directed      Increase activity slowly    Complete by:  As directed             Medication List    STOP taking these medications        naproxen 500 MG tablet  Commonly known as:  NAPROSYN      TAKE these medications        aspirin EC 81 MG tablet  Take 81 mg by mouth daily.     ferrous sulfate 325 (65 FE) MG tablet  Take 1 tablet (325 mg total) by mouth 3 (three) times daily with meals.     furosemide 20 MG tablet  Commonly known as:  LASIX  Take 1 tablet (20 mg total) by mouth daily.     gabapentin 300 MG capsule  Commonly known as:  NEURONTIN  Take 300 mg by mouth 3 (three) times daily.     metoprolol succinate 50 MG 24 hr tablet  Commonly known as:  TOPROL-XL  Take 1 tablet (50 mg total) by mouth daily.     omeprazole 20 MG capsule  Commonly known as:  PRILOSEC  Take 20 mg by mouth 2 (two) times daily before a meal.     traMADol 50 MG tablet  Commonly known as:  ULTRAM  Take 50 mg by mouth every 6 (six) hours as needed for moderate pain.       No Known Allergies Follow-up Information    Follow up with [facility].   Why:  They will do your home health care at your home   Contact information:   [address] [place] [address] [phone]       Follow up with Idelle Crouch, MD On 08/08/2015.   Specialty:  Internal Medicine   Why:  @ 3:30pm       ..... confirmed w/ Dawn   Contact information:   [address] [place] [address] [phone]       Follow up with Isaias Cowman, MD.   Specialty:  Cardiology   Why:  Patient's family confirmed appointment for next Tuesday 08/08/2015. Will need BMET at that time to check creatinine and potassium.   Contact information:   [facility] [place] [address] [phone]        The results of significant diagnostics from this hospitalization (including imaging, microbiology, ancillary and laboratory) are listed below for reference.    Significant Diagnostic Studies: X-ray Chest Pa And Lateral  07/28/2015  CLINICAL DATA:  Shortness of breath EXAM: CHEST  2 VIEW COMPARISON:  07/28/2015 FINDINGS: There is cardiomegaly. Marked enlargement of the main pulmonary  artery, stable. No confluent airspace opacities or effusions. Chronic left upper lobe pleural based lesion again noted. IMPRESSION: Cardiomegaly.  Marked pulmonary arterial enlargement, stable. Pleural-based left upper lobe lesion again noted, unchanged. Electronically Signed   By: Rolm Baptise M.D.   On: 07/28/2015 11:00   Dg Chest 2 View  07/28/2015  CLINICAL DATA:  Hypoxia EXAM: CHEST  2 VIEW COMPARISON:  01/15/2013 chest CT. 03/16/2014 chest x-ray not available FINDINGS: Chronic cardiomegaly and marked pulmonary artery enlargement. Diffuse interstitial coarsening without Kerley lines or pleural effusions. There is a 4 cm mass with left lateral indistinct appearance, correlating with pleural based mass 01/15/2013 by chest CT. This mass has shown indolent growth by CT since 2010, but is grossly stable compared to previous CT. 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 No pneumonia. No pneumothorax. IMPRESSION: 1. Cardiomegaly, pulmonary hypertension, and pulmonary venous congestion. 2. 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 Electronically Signed   By: Monte Fantasia M.D.   On: 07/28/2015 03:14   Ct Head Wo  Contrast  07/28/2015  CLINICAL DATA:  Fall with head injury and laceration EXAM: CT HEAD WITHOUT CONTRAST CT CERVICAL SPINE WITHOUT CONTRAST TECHNIQUE: Multidetector CT imaging of the head and cervical spine was performed following the standard protocol without intravenous contrast. Multiplanar CT image reconstructions of the cervical spine were also generated. COMPARISON:  03/29/2015 head CT FINDINGS: Scout image shows a left pulmonary nodule which correlates with pleural based mass on 01/15/2013 chest CT. No gross enlargement. CT HEAD FINDINGS Skull and Sinuses:High posterior scalp laceration without calvarial fracture. Orbits: Soft tissue nodule in the anterior and nasal left orbit which is stable from prior and has been present since at least 2010, consistent with benign process. Morphology favors varix, although location is somewhat unusual. Bilateral cataract resection. Brain: No evidence of acute infarction, hemorrhage, hydrocephalus, or mass lesion/mass effect. Brain atrophy, asymmetrically advanced at the cerebellum and brainstem. Typical for age chronic small vessel ischemic change in the deep cerebral white matter. CT CERVICAL SPINE FINDINGS Negative for acute fracture or traumatic malalignment (C3-4 slight anterolisthesis appears degenerative). No prevertebral edema. No gross cervical canal hematoma. C4-5 and C5-6 degenerative disc disease with disc osteophyte complexes contacting the ventral cord. Diffuse uncovertebral spurring with multilevel foraminal stenosis, greatest at C4-5, C5-6, and C6-7. Imaging of the upper chest shows chronic enlargement of the main pulmonary artery consistent with pulmonary hypertension. Visible portion measures 42 mm in diameter. There is also multi nodular enlargement of the thyroid gland with dominant nodule in the lower left pole measuring up to 4 cm, size stable from 01/15/2013 chest CT. IMPRESSION: 1. No evidence of acute intracranial or cervical spine injury. 2.  Scalp laceration without calvarial fracture. 3. Multiple chronic incidental findings described above. Electronically Signed   By: Monte Fantasia M.D.   On: 07/28/2015 02:05   Ct Cervical Spine Wo Contrast  07/28/2015  CLINICAL DATA:  Fall with head injury and laceration EXAM: CT HEAD WITHOUT CONTRAST CT CERVICAL SPINE WITHOUT CONTRAST TECHNIQUE: Multidetector CT imaging of the head and cervical spine was performed following the standard protocol without intravenous contrast. Multiplanar CT image reconstructions of the cervical spine were also generated. COMPARISON:  03/29/2015 head CT FINDINGS: Scout image shows a left pulmonary nodule which correlates with pleural based mass on 01/15/2013 chest CT. No gross enlargement. CT HEAD FINDINGS Skull and Sinuses:High posterior scalp laceration without calvarial fracture. Orbits: Soft tissue nodule in the anterior and nasal left orbit which is  stable from prior and has been present since at least 2010, consistent with benign process. Morphology favors varix, although location is somewhat unusual. Bilateral cataract resection. Brain: No evidence of acute infarction, hemorrhage, hydrocephalus, or mass lesion/mass effect. Brain atrophy, asymmetrically advanced at the cerebellum and brainstem. Typical for age chronic small vessel ischemic change in the deep cerebral white matter. CT CERVICAL SPINE FINDINGS Negative for acute fracture or traumatic malalignment (C3-4 slight anterolisthesis appears degenerative). No prevertebral edema. No gross cervical canal hematoma. C4-5 and C5-6 degenerative disc disease with disc osteophyte complexes contacting the ventral cord. Diffuse uncovertebral spurring with multilevel foraminal stenosis, greatest at C4-5, C5-6, and C6-7. Imaging of the upper chest shows chronic enlargement of the main pulmonary artery consistent with pulmonary hypertension. Visible portion measures 42 mm in diameter. There is also multi nodular enlargement of the  thyroid gland with dominant nodule in the lower left pole measuring up to 4 cm, size stable from 01/15/2013 chest CT. IMPRESSION: 1. No evidence of acute intracranial or cervical spine injury. 2. Scalp laceration without calvarial fracture. 3. Multiple chronic incidental findings described above. Electronically Signed   By: Monte Fantasia M.D.   On: 07/28/2015 02:05    Microbiology: No results found for this or any previous visit (from the past 240 hour(s)).   Labs: Basic Metabolic Panel:  Recent Labs Lab 07/28/15 2058 07/29/15 1045 07/30/15 0330 07/31/15 0441 08/01/15 0458 08/02/15 0223  NA  --  140 141 141 138 139  K  --  3.7 3.5 3.5 3.7 3.7  CL  --  104 106 104 104 103  CO2  --  28 27 28 24 27   GLUCOSE  --  167* 131* 165* 151* 147*  BUN  --  22* 27* 28* 41* 34*  CREATININE  --  0.68 0.62 0.85 1.27* 0.82  CALCIUM  --  9.0 8.9 9.1 8.6* 8.8*  MG 2.0  --   --   --  1.9  --    Liver Function Tests: No results for input(s): AST, ALT, ALKPHOS, BILITOT, PROT, ALBUMIN in the last 168 hours. No results for input(s): LIPASE, AMYLASE in the last 168 hours. No results for input(s): AMMONIA in the last 168 hours. CBC:  Recent Labs Lab 07/28/15 0328 07/28/15 0835 07/28/15 1100  WBC 9.0  --   --   NEUTROABS 6.5  --   --   HGB 9.5* 9.5*  --   HCT 32.6* 32.6* 33.9*  MCV 64.8*  --   --   PLT 164  --   --    Cardiac Enzymes:  Recent Labs Lab 07/28/15 0328  TROPONINI 0.03   BNP: BNP (last 3 results)  Recent Labs  07/28/15 0328  BNP 466.5*    ProBNP (last 3 results) No results for input(s): PROBNP in the last 8760 hours.  CBG:  Recent Labs Lab 07/31/15 1606 08/02/15 1037  GLUCAP 133* 223*       SignedDebbe Odea, MD [facility] 08/02/2015, 12:27 PM

## 2015-08-02 NOTE — Progress Notes (Signed)
I stopped in to assist with ReDS vest fitting.  I also reviewed again with daughter and granddaughter low sodium food options and high sodium foods to avoid.  I emphasized a 2000 mg Sodium diet.  They expressed some confusion about instructions related to what to do if her weight was up 3 pounds overnight or 5 pounds in 5 days.  The granddaughter said that they had been instructed by Dr. Julianne Handler to "take an extra Lasix pill" when that happens and had been told by other providers to call the office.  Per Dr. Reatha Armour note it does instruct that patient can take extra Lasix when weight is up.  I asked the daughter and granddaughter to discuss this issue with Dr. Saralyn Pilar next week at her appt.  I have also encouraged them to call me with any concerns or questions related to her HF after discharge.

## 2015-08-02 NOTE — Progress Notes (Signed)
Pt with O2 saturation of 88-89% on room air. 2L [place] applied. O2 sat increased to 94%. Humidifier applied for comfort. Will continue to monitor.

## 2015-08-02 NOTE — Progress Notes (Signed)
Physical Therapy Treatment Patient Details Name: Jessica Blackwell MRN: [id number] DOB: 10/27/1924 Today's Date: 08/02/2015    History of Present Illness Patient is a 81 y/o female presents s/p fall at home, lacertation done in the ED.  Found to have saturations in the 80s which prompted a chest x-ray that showed cardiomegaly with heart failure and a BNP of 466. PMh includes HTN,  CHF, neuropathy.    PT Comments    Pt and granddaughter indicate D/C plan is for pt to return to home with daughter staying with pt for at least a week or as needed.  Discussed need for increased A at D/C and option for 3-in-1 as pt and granddaughter indicate that pt's walker does not fit in bathroom.  Will continue to follow if remains on acute.    Follow Up Recommendations  Home health PT;Supervision/Assistance - 24 hour     Equipment Recommendations  3in1 (PT)    Recommendations for Other Services       Precautions / Restrictions Precautions Precautions: Fall Restrictions Weight Bearing Restrictions: No    Mobility  Bed Mobility               General bed mobility comments: pt sitting on 3-in-1 on arrival.  Transfers Overall transfer level: Needs assistance Equipment used: Rolling walker (2 wheeled) Transfers: Sit to/from [facility] Sit to Stand: Min guard;Min assist Stand pivot transfers: Min guard       General transfer comment: Pending sitting surface pt needs MinA to MinG for coming to standing.    Ambulation/Gait Ambulation/Gait assistance: Min guard Ambulation Distance (Feet): 100 Feet Assistive device: Rolling walker (2 wheeled) Gait Pattern/deviations: Step-through pattern;Decreased stride length;Trunk flexed     General Gait Details: pt normally uses a rollator and walks far behind RW.  pt fatigues easily and needs to moderate activity.  O2 sats 95% on RA prior to ambulation and 94% on RA after ambulation.     Stairs            Wheelchair  Mobility    Modified Rankin (Stroke Patients Only)       Balance Overall balance assessment: Needs assistance Sitting-balance support: Feet supported;No upper extremity supported Sitting balance-Leahy Scale: Good     Standing balance support: Bilateral upper extremity supported;During functional activity Standing balance-Leahy Scale: Poor                      Cognition Arousal/Alertness: Awake/alert Behavior During Therapy: WFL for tasks assessed/performed Overall Cognitive Status: Within Functional Limits for tasks assessed                      Exercises      General Comments        Pertinent Vitals/Pain Pain Assessment: No/denies pain    Home Living                      Prior Function            PT Goals (current goals can now be found in the care plan section) Acute Rehab PT Goals Patient Stated Goal: to go home PT Goal Formulation: With patient Time For Goal Achievement: 08/12/15 Potential to Achieve Goals: Fair Progress towards PT goals: Progressing toward goals    Frequency  Min 3X/week    PT Plan Discharge plan needs to be updated    Co-evaluation             End  of Session Equipment Utilized During Treatment: Gait belt Activity Tolerance: Patient limited by fatigue Patient left: in chair;with call bell/phone within reach;with chair alarm set;with family/visitor present     Time: [id number] PT Time Calculation (min) (ACUTE ONLY): 24 min  Charges:  $Gait Training: 8-22 mins $Therapeutic Activity: 8-22 mins                    G CodesCatarina Hartshorn, [employer] 08/02/2015, 11:05 AM

## 2015-08-02 NOTE — Progress Notes (Signed)
Patient Name: Jessica Blackwell Date of Encounter: 08/02/2015  Principal Problem:   Fall at home Active Problems:   Acute respiratory failure with hypoxia (HCC)   Pulmonary congestion   Laceration of right parietal lobe (HCC)   Hypoxia   GERD (gastroesophageal reflux disease)   HTN (hypertension)   Anemia   Acute systolic congestive heart failure ([place])   AKI (acute kidney injury) ([place])   V-tach (HCC)   NSVT (nonsustained ventricular tachycardia) ([place])   Persistent atrial fibrillation [facility])   Primary Cardiologist: [facility] - Dr. Saralyn Pilar Patient Profile: 81 y.o. female w/ PMH of HTN, GERD, chronic atrial fibrillation and chronic systolic CHF (EF [id number] in [id number]) who presented to [facility] ED on 07/28/2015 for a fall she suffered earlier that night. Echo on 07/30/2015 showed slightly reduced EF of 25-30%.   SUBJECTIVE: Denies any chest pain, palpitations, or shortness of breath overnight.  OBJECTIVE Filed Vitals:   08/01/15 2040 08/02/15 0019 08/02/15 0323 08/02/15 0446  BP: 114/54 119/60 140/67   Pulse: 92 97 90   Temp: 98.2 F (36.8 C) 98 F (36.7 C) 98.3 F (36.8 C)   TempSrc: Oral Oral Oral   Resp: 18 18 18    Height:      Weight:    144 lb 2.9 oz (65.4 kg)  SpO2: 92% 90% 98%     Intake/Output Summary (Last 24 hours) at 08/02/15 0945 Last data filed at 08/02/15 0900  Gross per 24 hour  Intake   1420 ml  Output    351 ml  Net   1069 ml   Filed Weights   07/31/15 0319 08/01/15 0558 08/02/15 0446  Weight: 141 lb 6.4 oz (64.139 kg) 142 lb 10.2 oz (64.701 kg) 144 lb 2.9 oz (65.4 kg)    PHYSICAL EXAM General: Well developed, well nourished, female in no acute distress. Head: Normocephalic, atraumatic.  Neck: Supple without bruits, JVD not elevated. Lungs:  Resp regular and unlabored, CTA without wheezing or rales. Heart: RRR, S1, S2, no S3, S4, 2/6 SEM at LUSB; no rub. Abdomen: Soft, non-tender, non-distended with normoactive bowel sounds. No hepatomegaly. No  rebound/guarding. No obvious abdominal masses. Extremities: No clubbing, cyanosis, or edema. Distal pedal pulses are 2+ bilaterally. Neuro: Alert and oriented X 3. Moves all extremities spontaneously. Psych: Normal affect.  LABS: Basic Metabolic Panel: Recent Labs  08/01/15 0458 08/02/15 0223  NA 138 139  K 3.7 3.7  CL 104 103  CO2 24 27  GLUCOSE 151* 147*  BUN 41* 34*  CREATININE 1.27* 0.82  CALCIUM 8.6* 8.8*  MG 1.9  --    BNP:  B NATRIURETIC PEPTIDE  Date/Time Value Ref Range Status  07/28/2015 03:28 AM 466.5* 0.0 - 100.0 pg/mL Final    TELE: NSR with rate in 80's - 90's. 5 to 13 beats of NSVT overnight.       ECHO: 07/30/2015 Study Conclusions - Left ventricle: The cavity size was normal. Wall thickness was increased in a pattern of mild LVH. Systolic function was severely reduced. The estimated ejection fraction was in the range of 25% to 30%. - Aortic valve: AV is thckened, calcified By 2 D images appears to have mildly restricted motion. Mean gradient across the valve is 9 mm Hg There was trivial regurgitation. - Left atrium: The atrium was moderately dilated. - Pulmonary arteries: PA peak pressure: 43 mm Hg (S). - Pericardium, extracardiac: A trivial pericardial effusion was identified.  Impressions: - Poor acoustic windows limit study.  Current Medications:  . acetaminophen      . aspirin EC  81 mg Oral Daily  . enoxaparin (LOVENOX) injection  40 mg Subcutaneous Q24H  . ferrous sulfate  325 mg Oral TID WC  . furosemide  20 mg Oral Daily  . gabapentin  300 mg Oral TID  . metoprolol succinate  50 mg Oral Daily  . pantoprazole  40 mg Oral Daily  . sodium chloride  3 mL Intravenous Q12H      ASSESSMENT AND PLAN:  1. Acute on Chronic Systolic CHF - CXR on admission showed cardiomegaly along with stable pulmonary arterial enlargement. BNP elevated at 466 on 07/28/2015. - Echocardiogram on 07/30/2015 showed EF of 25% to 30%. Previous EF was  35% in 06/2014  - Has diuresed well with Lasix 40mg  IV BID. Net output of -1.1L. Weight is down 3 lbs since admission.  - appears to be close to her dry weight. Has been switched to PO Lasix 20mg  daily. Continue at this dose at time of discharge. - continue BB. She did not tolerate low dose ACE-I secondary to hypotension.   2. Chronic Atrial Fibrillation - This patients CHA2DS2-VASc Score and unadjusted Ischemic Stroke Rate (% per year) is equal to 7.2 % stroke rate/year from a score of 5 (CHF, HTN, Female, Age > 91 x2). She does not wish to be on anticoagulation. Likely not a good candidate for anticoagulation due to history of frequent mechanical falls. - HR has been in 90's - 110's in the past 24 hours. Currently on Toprol-XL 50mg .   3. NSVT - 7-13 beats of asymptomatic NSVT on 08/02/2015. 13 beats of NSVT overnight. - potassium and Mg both normal. - continue Toprol-XL 50mg  daily.   4. Hypotension - Currently resolved. BP has been 104/50 - 140/67 in the past 24 hours. Lisinopril was discontinued and Lasix switched from IV to PO.   Has scheduled follow-up with Dr. Saralyn Pilar next Tuesday. Will need BMET at that time.  Arna Medici , PA-C 9:45 AM 08/02/2015 Pager: [phone]  I have personally seen and examined this patient with Bernerd Pho, PA-C. I agree with the assessment and plan as outlined above. Continue Lasix 20 mg daily, follow daily weighs and use extra Lasix as needed. Continue Toprol 50 mg daily. OK to discharge home today and f/u with Dr. Saralyn Pilar in [place] next week.   MCALHANY,CHRISTOPHER 08/02/2015 11:24 AM

## 2015-08-02 NOTE — Progress Notes (Signed)
ReDS Vest Discharge Study  Results of ReDS reading  Your patient is in the Blinded arm of the Vest at Discharge study.  Your patient has had a ReDS Vest reading and the reading has been transmitted to the cloud.  Your patient is ok for discharge.    Thank You   The research team    

## 2015-11-07 ENCOUNTER — Telehealth: Payer: Self-pay | Admitting: *Deleted

## 2015-11-07 NOTE — Telephone Encounter (Signed)
I called patient to follow-up with 44-month phone call for [email or web address]@[email or web address]  Study. Patient had no hospitalizations in last 3 months. i thanked patient for her participation in the study.

## 2016-10-01 ENCOUNTER — Emergency Department
Admission: EM | Admit: 2016-10-01 | Discharge: 2016-10-01 | Disposition: A | Discharge status: Home / Self Care | Payer: Medicare Other | Attending: Emergency Medicine | Admitting: Emergency Medicine

## 2016-10-01 ENCOUNTER — Emergency Department: Payer: Medicare Other

## 2016-10-01 DIAGNOSIS — H1132 Conjunctival hemorrhage, left eye: Secondary | ICD-10-CM

## 2016-10-01 DIAGNOSIS — Z79899 Other long term (current) drug therapy: Secondary | ICD-10-CM | POA: Diagnosis not present

## 2016-10-01 DIAGNOSIS — I110 Hypertensive heart disease with heart failure: Secondary | ICD-10-CM | POA: Diagnosis not present

## 2016-10-01 DIAGNOSIS — Z7982 Long term (current) use of aspirin: Secondary | ICD-10-CM | POA: Diagnosis not present

## 2016-10-01 DIAGNOSIS — I509 Heart failure, unspecified: Secondary | ICD-10-CM | POA: Insufficient documentation

## 2016-10-01 DIAGNOSIS — H578 Other specified disorders of eye and adnexa: Secondary | ICD-10-CM | POA: Diagnosis present

## 2016-10-01 NOTE — ED Provider Notes (Signed)
[facility] Emergency Department Provider Note   ____________________________________________   I have reviewed the triage vital signs and the nursing notes.   HISTORY  Chief Complaint Left eye swelling  History limited by: Not Limited   HPI Jessica Blackwell is a 83 y.o. female who presents to the emergency department today because of concerns for left eye swelling. The patient states she woke up and noticed that her left eye was bloodshot and swollen. She denies any trauma to her eye. She denies any pain to her. She denies any vision change. She went and saw her eye doctor earlier today and today I exam and stated that her vision was within her normal limits and that her pressures were good. He did schedule her to have an outpatient CT done of her eyes however family noticed that the swelling was getting worse this afternoon. When they called Dr. eye doctor he recommended that she go ahead and have the CT scan done today.   Past Medical History:  Diagnosis Date  . Arthritis   . CHF (congestive heart failure) ([place])   . Fall at home 07/28/2015  . GERD (gastroesophageal reflux disease)   . H/O bladder infections   . Hypertension   . Neuropathy [facility])    lower extrmities    Patient Active Problem List   Diagnosis Date Noted  . AKI (acute kidney injury) ([place]) 08/01/2015  . V-tach ([place]) 08/01/2015  . NSVT (nonsustained ventricular tachycardia) ([place])   . Persistent atrial fibrillation ([place])   . Acute systolic congestive heart failure ([place]) 07/31/2015  . Acute respiratory failure with hypoxia ([place]) 07/28/2015  . Fall at home 07/28/2015  . GERD (gastroesophageal reflux disease) 07/28/2015  . HTN (hypertension) 07/28/2015  . Anemia 07/28/2015    Past Surgical History:  Procedure Laterality Date  . ABDOMINAL HYSTERECTOMY    . CATARACT EXTRACTION    . COLON SURGERY    . FOOT SURGERY Right   . ROTATOR CUFF REPAIR Left   . TONSILLECTOMY      Prior to  Admission medications   Medication Sig Start Date End Date Taking? Authorizing Provider  aspirin EC 81 MG tablet Take 81 mg by mouth daily.    Historical Provider, MD  ferrous sulfate 325 (65 FE) MG tablet Take 1 tablet (325 mg total) by mouth 3 (three) times daily with meals. 08/02/15   Debbe Odea, MD  furosemide (LASIX) 20 MG tablet Take 1 tablet (20 mg total) by mouth daily. 08/02/15   Debbe Odea, MD  gabapentin (NEURONTIN) 300 MG capsule Take 300 mg by mouth 3 (three) times daily.    Historical Provider, MD  metoprolol succinate (TOPROL-XL) 50 MG 24 hr tablet Take 1 tablet (50 mg total) by mouth daily. 08/02/15   Debbe Odea, MD  omeprazole (PRILOSEC) 20 MG capsule Take 20 mg by mouth 2 (two) times daily before a meal.    Historical Provider, MD  traMADol (ULTRAM) 50 MG tablet Take 50 mg by mouth every 6 (six) hours as needed for moderate pain.    Historical Provider, MD    Allergies Patient has no known allergies.  Family History  Problem Relation Age of Onset  . Hypertension Father     Social History Social History  Substance Use Topics  . Smoking status: Never Smoker  . Smokeless tobacco: Never Used  . Alcohol use No    Review of Systems  Constitutional: Negative for fever. Cardiovascular: Negative for chest pain. Respiratory: Negative for shortness  of breath. Skin: Positive for bruising and swelling around left eye. Neurological: Negative for headaches, focal weakness or numbness.  10-point ROS otherwise negative.  ____________________________________________   PHYSICAL EXAM:  VITAL SIGNS: ED Triage Vitals  Enc Vitals Group     BP 154/88     Pulse 77     Resp 18     Temp 98     Temp src      SpO2 95     Weight    Constitutional: Alert and oriented. Well appearing and in no distress. Eyes: Right eye wnl. Left eye with significant subconjunctival hemorrhage. Pupils equal round and reactive.  ENT   Head: Normocephalic and atraumatic.   Nose: No  congestion/rhinnorhea.   Mouth/Throat: Mucous membranes are moist.   Neck: No stridor. Cardiovascular: Normal rate, regular rhythm.  No murmurs, rubs, or gallops. Respiratory: Normal respiratory effort without tachypnea nor retractions. Breath sounds are clear and equal bilaterally. No wheezes/rales/rhonchi. Musculoskeletal: Normal range of motion in all extremities. No lower extremity edema. Neurologic:  Normal speech and language. No gross focal neurologic deficits are appreciated.  Skin:  Skin is warm, dry and intact. No rash noted. Psychiatric: Mood and affect are normal. Speech and behavior are normal. Patient exhibits appropriate insight and judgment.  ____________________________________________    LABS (pertinent positives/negatives)  None  ____________________________________________   EKG  None  ____________________________________________    RADIOLOGY  CT orbits IMPRESSION:  1. 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 No intraorbital hematoma or  inflammation.  2. New nonspecific left preseptal soft tissue swelling, such as  could be related to cellulitis or hematoma.  3. New obstructive pattern left paranasal sinusitis. No complicating  features or extension into the left orbit is evident such that this  may be unrelated to #2.  4. No acute osseous abnormality identified.   ____________________________________________   PROCEDURES  Procedures  ____________________________________________   INITIAL IMPRESSION / ASSESSMENT AND PLAN / ED COURSE  Pertinent labs & imaging results that were available during my care of the patient were reviewed by me and considered in my medical decision making (see chart for details).  Patient presented to the emergency department today because of left eye swelling. Exam is consistent with subconjunctival hemorrhage. CT orbits performed without retroorbital  hematoma. Discussed expected course with patient. Encouraged follow up with eye doctor.  ____________________________________________   FINAL CLINICAL IMPRESSION(S) / ED DIAGNOSES  Final diagnoses:  Subconjunctival hemorrhage of left eye     Note: This dictation was prepared with Dragon dictation. Any transcriptional errors that result from this process are unintentional     Nance Pear, MD 10/01/16 2218

## 2016-10-01 NOTE — Discharge Instructions (Signed)
Please seek medical attention for any high fevers, chest pain, shortness of breath, change in behavior, persistent vomiting, bloody stool or any other new or concerning symptoms.  

## 2016-10-01 NOTE — ED Notes (Signed)
Visual acuity scale; 20/40 in right eye, 20/50 in left eye.  MD aware.

## 2016-10-01 NOTE — ED Triage Notes (Signed)
Pt states she woke up about 10am today with swelling and blood around the left eye, states she went to see the optometrist around 2pm and states everything looked good but she has some blood behind the eye that he ordered a CT outpatient but pt states the swelling worsened.. Pt denies any pain or visual changes at present.Marland Kitchen

## 2016-10-01 NOTE — ED Notes (Signed)
Patient transported to CT 

## 2016-12-22 ENCOUNTER — Encounter: Payer: Self-pay | Admitting: *Deleted

## 2016-12-22 ENCOUNTER — Observation Stay
Admission: EM | Admit: 2016-12-22 | Discharge: 2016-12-23 | Disposition: A | Discharge status: Home / Self Care | Payer: Medicare Other | Attending: Internal Medicine | Admitting: Internal Medicine

## 2016-12-22 ENCOUNTER — Emergency Department: Payer: Medicare Other

## 2016-12-22 DIAGNOSIS — I472 Ventricular tachycardia: Secondary | ICD-10-CM | POA: Diagnosis not present

## 2016-12-22 DIAGNOSIS — I481 Persistent atrial fibrillation: Secondary | ICD-10-CM | POA: Diagnosis not present

## 2016-12-22 DIAGNOSIS — I110 Hypertensive heart disease with heart failure: Secondary | ICD-10-CM | POA: Insufficient documentation

## 2016-12-22 DIAGNOSIS — R4020 Unspecified coma: Secondary | ICD-10-CM | POA: Diagnosis present

## 2016-12-22 DIAGNOSIS — R197 Diarrhea, unspecified: Secondary | ICD-10-CM | POA: Diagnosis not present

## 2016-12-22 DIAGNOSIS — K219 Gastro-esophageal reflux disease without esophagitis: Secondary | ICD-10-CM | POA: Diagnosis not present

## 2016-12-22 DIAGNOSIS — Z79899 Other long term (current) drug therapy: Secondary | ICD-10-CM | POA: Insufficient documentation

## 2016-12-22 DIAGNOSIS — G629 Polyneuropathy, unspecified: Secondary | ICD-10-CM | POA: Diagnosis not present

## 2016-12-22 DIAGNOSIS — R55 Syncope and collapse: Principal | ICD-10-CM | POA: Insufficient documentation

## 2016-12-22 DIAGNOSIS — I447 Left bundle-branch block, unspecified: Secondary | ICD-10-CM | POA: Diagnosis not present

## 2016-12-22 DIAGNOSIS — Z7982 Long term (current) use of aspirin: Secondary | ICD-10-CM | POA: Diagnosis present

## 2016-12-22 DIAGNOSIS — I5022 Chronic systolic (congestive) heart failure: Secondary | ICD-10-CM | POA: Diagnosis not present

## 2016-12-22 DIAGNOSIS — I5021 Acute systolic (congestive) heart failure: Secondary | ICD-10-CM

## 2016-12-22 LAB — CBC WITH DIFFERENTIAL/PLATELET
BASOS ABS: 0.1 10*3/uL (ref 0–0.1)
BASOS PCT: 2 %
EOS ABS: 0.1 10*3/uL (ref 0–0.7)
EOS PCT: 1 %
HCT: 43.3 % (ref 35.0–47.0)
HEMOGLOBIN: 13.6 g/dL (ref 12.0–16.0)
LYMPHS ABS: 1.5 10*3/uL (ref 1.0–3.6)
Lymphocytes Relative: 22 %
MCH: 23.4 pg — ABNORMAL LOW (ref 26.0–34.0)
MCHC: 31.5 g/dL — ABNORMAL LOW (ref 32.0–36.0)
MCV: 74.2 fL — ABNORMAL LOW (ref 80.0–100.0)
Monocytes Absolute: 0.6 10*3/uL (ref 0.2–0.9)
Monocytes Relative: 9 %
NEUTROS PCT: 66 %
Neutro Abs: 4.5 10*3/uL (ref 1.4–6.5)
PLATELETS: 89 10*3/uL — AB (ref 150–440)
RBC: 5.84 MIL/uL — AB (ref 3.80–5.20)
RDW: 19 % — ABNORMAL HIGH (ref 11.5–14.5)
WBC: 6.7 10*3/uL (ref 3.6–11.0)

## 2016-12-22 LAB — TROPONIN I
TROPONIN I: 0.04 ng/mL — AB (ref <0.03)
Troponin I: 0.03 ng/mL (ref <0.03)

## 2016-12-22 LAB — BASIC METABOLIC PANEL
Anion gap: 7 (ref 5–15)
BUN: 16 mg/dL (ref 6–20)
CALCIUM: 9.1 mg/dL (ref 8.9–10.3)
CO2: 26 mmol/L (ref 22–32)
CREATININE: 0.93 mg/dL (ref 0.44–1.00)
Chloride: 104 mmol/L (ref 101–111)
GFR calc Af Amer: >60 mL/min (ref >60)
GFR calc non Af Amer: 53 mL/min — ABNORMAL LOW (ref >60)
GLUCOSE: 130 mg/dL — AB (ref 65–99)
Potassium: 4.8 mmol/L (ref 3.5–5.1)
SODIUM: 137 mmol/L (ref 135–145)

## 2016-12-22 LAB — BRAIN NATRIURETIC PEPTIDE: B Natriuretic Peptide: 555 pg/mL — ABNORMAL HIGH (ref 0.0–100.0)

## 2016-12-22 LAB — MAGNESIUM: Magnesium: 1.7 mg/dL (ref 1.7–2.4)

## 2016-12-22 MED ORDER — ENOXAPARIN SODIUM 40 MG/0.4ML ~~LOC~~ SOLN
40.0000 mg | SUBCUTANEOUS | Status: DC
  Administered 2016-12-22: 40 mg via SUBCUTANEOUS
  Filled 2016-12-22: qty 0.4

## 2016-12-22 MED ORDER — ONDANSETRON HCL 4 MG/2ML IJ SOLN: 4.0000 mg | Freq: Four times a day (QID) | INTRAMUSCULAR | Status: DC | PRN

## 2016-12-22 MED ORDER — GABAPENTIN 300 MG PO CAPS
300.0000 mg | ORAL_CAPSULE | Freq: Three times a day (TID) | ORAL | Status: DC
  Administered 2016-12-22 – 2016-12-23 (×2): 300 mg via ORAL
  Filled 2016-12-22 (×2): qty 1

## 2016-12-22 MED ORDER — FUROSEMIDE 20 MG PO TABS
20.0000 mg | ORAL_TABLET | Freq: Every day | ORAL | Status: DC
  Administered 2016-12-23: 20 mg via ORAL
  Filled 2016-12-22: qty 1

## 2016-12-22 MED ORDER — PANTOPRAZOLE SODIUM 40 MG PO TBEC
40.0000 mg | DELAYED_RELEASE_TABLET | Freq: Every day | ORAL | Status: DC
  Administered 2016-12-23: 40 mg via ORAL
  Filled 2016-12-22: qty 1

## 2016-12-22 MED ORDER — ACETAMINOPHEN 325 MG PO TABS: 650.0000 mg | ORAL_TABLET | Freq: Four times a day (QID) | ORAL | Status: DC | PRN

## 2016-12-22 MED ORDER — SODIUM CHLORIDE 0.9% FLUSH
3.0000 mL | Freq: Two times a day (BID) | INTRAVENOUS | Status: DC
  Administered 2016-12-22 – 2016-12-23 (×2): 3 mL via INTRAVENOUS

## 2016-12-22 MED ORDER — ONDANSETRON HCL 4 MG PO TABS: 4.0000 mg | ORAL_TABLET | Freq: Four times a day (QID) | ORAL | Status: DC | PRN

## 2016-12-22 MED ORDER — FERROUS SULFATE 325 (65 FE) MG PO TABS
325.0000 mg | ORAL_TABLET | Freq: Three times a day (TID) | ORAL | Status: DC
  Filled 2016-12-22: qty 1

## 2016-12-22 MED ORDER — ASPIRIN EC 81 MG PO TBEC
81.0000 mg | DELAYED_RELEASE_TABLET | Freq: Every day | ORAL | Status: DC
  Administered 2016-12-23: 81 mg via ORAL
  Filled 2016-12-22: qty 1

## 2016-12-22 MED ORDER — SPIRONOLACTONE 25 MG PO TABS
12.5000 mg | ORAL_TABLET | Freq: Every day | ORAL | Status: DC
  Administered 2016-12-23: 12.5 mg via ORAL
  Filled 2016-12-22: qty 1

## 2016-12-22 MED ORDER — ACETAMINOPHEN 650 MG RE SUPP
650.0000 mg | Freq: Four times a day (QID) | RECTAL | Status: DC | PRN
Start: 2016-12-22 — End: 2016-12-23

## 2016-12-22 MED ORDER — SENNOSIDES-DOCUSATE SODIUM 8.6-50 MG PO TABS: 1.0000 | ORAL_TABLET | Freq: Every evening | ORAL | Status: DC | PRN

## 2016-12-22 MED ORDER — METOPROLOL SUCCINATE ER 50 MG PO TB24
50.0000 mg | ORAL_TABLET | Freq: Every day | ORAL | Status: DC
  Administered 2016-12-23: 50 mg via ORAL
  Filled 2016-12-22: qty 1

## 2016-12-22 NOTE — ED Notes (Signed)
Patient left for CT.

## 2016-12-22 NOTE — ED Notes (Signed)
Attempted to call report

## 2016-12-22 NOTE — ED Notes (Signed)
Patient taken to imaging. 

## 2016-12-22 NOTE — ED Provider Notes (Signed)
[facility] Emergency Department Provider Note  ____________________________________________  Time seen: Approximately 3:19 PM  I have reviewed the triage vital signs and the nursing notes.   HISTORY  Chief Complaint Near Syncope   HPI Jessica Blackwell is a 83 y.o. female is a history of heart failure, V. tach, atrial fibrillation, hypertension, anemia who presents for evaluation of a syncopal episode. According to family patient was sitting at the table having dinner when she had a syncopal episode. Patient tells me that she thought she was asleep. She reports that she felt dizzy and very tired and was unable to open her eyes. She reports that she was able to hear her family. She did not fall from the chair. Patient had a fall 5 days ago in the bathroom. She also does not remember the circumstances around the fall and is not sure if she passed out or not. She fell in the bathroom and hurt her right forearm. She was not seen at that time. She denies any pain. She denies headache, neck pain, back pain, chest pain, abdominal pain, palpitations both preceding or after the fall. She does endorse 2 episodes of diarrhea yesterday evening but none today.No melena or hematochezia.  According to daughter and granddaughter, patient was at the table and dozed off. They thought she was asleep and tried to wake her up and she was unresponsive for 10 min in spite of multiple family members attempts to wake her up. Patient had a pulse and BG was normal. EMS was then called. Family says patient has been falling asleep more often however she is usually easy to wake up.   Past Medical History:  Diagnosis Date  . Arthritis   . CHF (congestive heart failure) ([place])   . Fall at home 07/28/2015  . GERD (gastroesophageal reflux disease)   . H/O bladder infections   . Hypertension   . Neuropathy [facility])    lower extrmities    Patient Active Problem List   Diagnosis Date Noted  . AKI  (acute kidney injury) ([place]) 08/01/2015  . V-tach ([place]) 08/01/2015  . NSVT (nonsustained ventricular tachycardia) ([place])   . Persistent atrial fibrillation ([place])   . Acute systolic congestive heart failure ([place]) 07/31/2015  . Acute respiratory failure with hypoxia ([place]) 07/28/2015  . Fall at home 07/28/2015  . GERD (gastroesophageal reflux disease) 07/28/2015  . HTN (hypertension) 07/28/2015  . Anemia 07/28/2015    Past Surgical History:  Procedure Laterality Date  . ABDOMINAL HYSTERECTOMY    . CATARACT EXTRACTION    . COLON SURGERY    . FOOT SURGERY Right   . ROTATOR CUFF REPAIR Left   . TONSILLECTOMY      Prior to Admission medications   Medication Sig Start Date End Date Taking? Authorizing Provider  aspirin EC 81 MG tablet Take 81 mg by mouth daily.   Yes Historical Provider, MD  furosemide (LASIX) 20 MG tablet Take 1 tablet (20 mg total) by mouth daily. Patient taking differently: Take 40 mg by mouth 2 (two) times daily.  08/02/15  Yes Debbe Odea, MD  gabapentin (NEURONTIN) 300 MG capsule Take 300 mg by mouth 3 (three) times daily.   Yes Historical Provider, MD  metoprolol succinate (TOPROL-XL) 50 MG 24 hr tablet Take 1 tablet (50 mg total) by mouth daily. 08/02/15  Yes Debbe Odea, MD  RABEprazole (ACIPHEX) 20 MG tablet Take 20 mg by mouth daily.   Yes Historical Provider, MD  spironolactone (ALDACTONE) 25 MG  tablet Take 12.5 mg by mouth daily.   Yes Historical Provider, MD  ferrous sulfate 325 (65 FE) MG tablet Take 1 tablet (325 mg total) by mouth 3 (three) times daily with meals. Patient not taking: Reported on 12/22/2016 08/02/15   Debbe Odea, MD    Allergies Patient has no known allergies.  Family History  Problem Relation Age of Onset  . Hypertension Father     Social History Social History  Substance Use Topics  . Smoking status: Never Smoker  . Smokeless tobacco: Never Used  . Alcohol use No    Review of Systems  Constitutional: Negative for fever.  + syncope Eyes: Negative for visual changes. ENT: Negative for sore throat. Neck: No neck pain  Cardiovascular: Negative for chest pain. Respiratory: Negative for shortness of breath. Gastrointestinal: Negative for abdominal pain, vomiting or diarrhea. Genitourinary: Negative for dysuria. Musculoskeletal: Negative for back pain. Skin: Negative for rash. Neurological: Negative for headaches, weakness or numbness. Psych: No SI or HI  ____________________________________________   PHYSICAL EXAM:  VITAL SIGNS: Vitals:   12/22/16 1518  BP: 140/81  Pulse: 76  Resp: 18  Temp: 97.9 F (36.6 C)   Constitutional: Alert and oriented. Well appearing and in no apparent distress. HEENT:      Head: Normocephalic, hematoma to the R occipital area        Eyes: Conjunctivae are normal. Sclera is non-icteric. EOMI. PERRL      Mouth/Throat: Mucous membranes are moist.       Neck: Supple with no signs of meningismus. Cardiovascular: Regular rate and rhythm. No murmurs, gallops, or rubs. 2+ symmetrical distal pulses are present in all extremities. No JVD. Respiratory: Normal respiratory effort. Lungs are clear to auscultation bilaterally. No wheezes, crackles, or rhonchi.  Gastrointestinal: Soft, non tender, and non distended with positive bowel sounds. No rebound or guarding. Musculoskeletal: Large bruise and hematoma located in the R forearm. Nontender with normal range of motion in all extremities. No edema, cyanosis, or erythema of extremities. Neurologic: Normal speech and language. Face is symmetric. Moving all extremities. No gross focal neurologic deficits are appreciated. Skin: Skin is warm, dry and intact. No rash noted. Psychiatric: Mood and affect are normal. Speech and behavior are normal.  ____________________________________________   LABS (all labs ordered are listed, but only abnormal results are displayed)  Labs Reviewed  CBC WITH DIFFERENTIAL/PLATELET - Abnormal; Notable  for the following:       Result Value   RBC 5.84 (*)    MCV 74.2 (*)    MCH 23.4 (*)    MCHC 31.5 (*)    RDW 19.0 (*)    Platelets 89 (*)    All other components within normal limits  BASIC METABOLIC PANEL - Abnormal; Notable for the following:    Glucose, Bld 130 (*)    GFR calc non Af Amer 53 (*)    All other components within normal limits  BRAIN NATRIURETIC PEPTIDE - Abnormal; Notable for the following:    B Natriuretic Peptide 555.0 (*)    All other components within normal limits  TROPONIN I - Abnormal; Notable for the following:    Troponin I 0.03 (*)    All other components within normal limits  MAGNESIUM   ____________________________________________  EKG  ED ECG REPORT I, Rudene Re, the attending physician, personally viewed and interpreted this ECG.  Normal sinus rhythm, rate of 75, first-degree AV block, left bundle branch block, prolonged QTC, left axis deviation, no ST elevations, ST depressions  on lateral leads. Unchanged from prior.  ____________________________________________  RADIOLOGY  CXR:  No acute cardiopulmonary disease. Stable chronic bibasilar interstitial changes. Stable moderate cardiomegaly and stable moderate prominence of the main pulmonary artery suggesting pulmonary arterial hypertension. Stable known pleural based mass over the posterolateral left thorax.  XR R forearm: No acute findings  CT head: Negative ____________________________________________   PROCEDURES  Procedure(s) performed: None Procedures Critical Care performed:  None ____________________________________________   INITIAL IMPRESSION / ASSESSMENT AND PLAN / ED COURSE  83 y.o. female is a history of heart failure, V. tach, atrial fibrillation, hypertension, anemia who presents for evaluation of a syncopal episode. Patient high risk for cardiac arrhythmias. We'll monitor on telemetry. Will check basic blood work to rule out acute blood loss anemia,  electrolyte abnormalities, dehydration, ACS. Will get head CT and XR of the R forearm.  Plan to admit.  Clinical Course as of Dec 22 1720  Nancy Fetter Dec 22, 2016  1711 CT head negative. Labs with trop showing 0.03. Remaining of labs with no acute findings. Will admit for monitoring and observation.  [CV]    Clinical Course User Index [CV] Rudene Re, MD    Pertinent labs & imaging results that were available during my care of the patient were reviewed by me and considered in my medical decision making (see chart for details).    ____________________________________________   FINAL CLINICAL IMPRESSION(S) / ED DIAGNOSES  Final diagnoses:  Syncope, unspecified syncope type      NEW MEDICATIONS STARTED DURING THIS VISIT:  New Prescriptions   No medications on file     Note:  This document was prepared using Dragon voice recognition software and may include unintentional dictation errors.    Rudene Re, MD 12/22/16 [id number]

## 2016-12-22 NOTE — H&P (Signed)
PCP:   Idelle Crouch, MD   Chief Complaint:  Decreased responsiveness  HPI: This is a 83 year old female who had a bad night last night because of diarrhea. Her last BM was earlier this morning. She didn't develop early and she was expecting family for Sunday dinner. She was busy in the kitchen for over 2 hours assisting the preparation of the meal. She felt tired, went to the dining table, her head slumped but her body remain upright. Her family initially thought she was sleeping. Family later went to check her to get her to move to the couch and she did not respond. They attempted to wake her for 15 minutes without much success. During this time the patient's head remaind hung and her body upright and she did not slump. When she finally opened her eyes, she was alert and oriented 3. Her daughter who is an EMT found no evidence of confusion or disorientation. When the EMT arrived and checked grip and strength, everything was symmetrical and normal. The patient states that she did hear the family calling but she was sleepy and did not arouse. The patient has a history of V. tach and A. fib, the family was concerned and she was brought to the ER. The patient denies any chest pains, palpitations. She's been having some increasing weakness of recent. She fell Tuesday in the bathtub. She was evaluated in the ER then, had a negative CT head and discharged home. The hospitalist have been asked to admit. She reread by the patient as well as her daughter present at bedside.  Review of Systems:  The patient denies anorexia, fever, weight loss, decrease level of consciousness, vision loss, decreased hearing, hoarseness, chest pain, syncope, dyspnea on exertion, peripheral edema, balance deficits, hemoptysis, abdominal pain, melena, hematochezia, severe indigestion/heartburn, hematuria, incontinence, genital sores, muscle weakness, suspicious skin lesions, transient blindness, difficulty walking, depression,  unusual weight change, abnormal bleeding, enlarged lymph nodes, angioedema, and breast masses.  Past Medical History: Past Medical History:  Diagnosis Date  . Arthritis   . CHF (congestive heart failure) ([place])   . Fall at home 07/28/2015  . GERD (gastroesophageal reflux disease)   . H/O bladder infections   . Hypertension   . Neuropathy ([place])    lower extrmities   Past Surgical History:  Procedure Laterality Date  . ABDOMINAL HYSTERECTOMY    . CATARACT EXTRACTION    . COLON SURGERY    . FOOT SURGERY Right   . ROTATOR CUFF REPAIR Left   . TONSILLECTOMY      Medications: Prior to Admission medications   Medication Sig Start Date End Date Taking? Authorizing Provider  aspirin EC 81 MG tablet Take 81 mg by mouth daily.   Yes Historical Provider, MD  furosemide (LASIX) 20 MG tablet Take 1 tablet (20 mg total) by mouth daily. Patient taking differently: Take 40 mg by mouth 2 (two) times daily.  08/02/15  Yes Debbe Odea, MD  gabapentin (NEURONTIN) 300 MG capsule Take 300 mg by mouth 3 (three) times daily.   Yes Historical Provider, MD  metoprolol succinate (TOPROL-XL) 50 MG 24 hr tablet Take 1 tablet (50 mg total) by mouth daily. 08/02/15  Yes Debbe Odea, MD  RABEprazole (ACIPHEX) 20 MG tablet Take 20 mg by mouth daily.   Yes Historical Provider, MD  spironolactone (ALDACTONE) 25 MG tablet Take 12.5 mg by mouth daily.   Yes Historical Provider, MD  ferrous sulfate 325 (65 FE) MG tablet Take 1 tablet (325 mg total)  by mouth 3 (three) times daily with meals. Patient not taking: Reported on 12/22/2016 08/02/15   Debbe Odea, MD    Allergies:  No Known Allergies  Social History:  reports that she has never smoked. She has never used smokeless tobacco. She reports that she does not drink alcohol or use drugs.  Family History: Family History  Problem Relation Age of Onset  . Hypertension Father     Physical Exam: Vitals:   12/22/16 1518 12/22/16 1532 12/22/16 1725  BP:  140/81  134/71  Pulse: 76  81  Resp: 18  18  Temp: 97.9 F (36.6 C)    TempSrc: Oral    SpO2: 93%  91%  Weight:  72 kg (158 lb 11.2 oz)   Height:  5\' 1"  (1.549 m)     General:  Alert and oriented times three, well developed and nourished, no acute distress, weak, pale female ENT: Moist oral mucosa, neck supple, no thyromegaly Lungs: clear to ascultation, no wheeze, no crackles, no use of accessory muscles Cardiovascular: regular rate and rhythm, no regurgitation, no gallops, no murmurs. No carotid bruits, no JVD Abdomen: soft, positive BS, non-tender, non-distended, no organomegaly, not an acute abdomen GU: not examined Neuro: CN II - XII grossly intact, sensation intact Musculoskeletal: strength 5/5 all extremities, no clubbing, cyanosis or edema Skin: no rash, no subcutaneous crepitation, no decubitus Psych: appropriate patient   Labs on Admission:   Recent Labs  12/22/16 1535  NA 137  K 4.8  CL 104  CO2 26  GLUCOSE 130*  BUN 16  CREATININE 0.93  CALCIUM 9.1  MG 1.7   No results for input(s): AST, ALT, ALKPHOS, BILITOT, PROT, ALBUMIN in the last 72 hours. No results for input(s): LIPASE, AMYLASE in the last 72 hours.  Recent Labs  12/22/16 1535  WBC 6.7  NEUTROABS 4.5  HGB 13.6  HCT 43.3  MCV 74.2*  PLT 89*    Recent Labs  12/22/16 1535  TROPONINI 0.03*   Invalid input(s): POCBNP No results for input(s): DDIMER in the last 72 hours. No results for input(s): HGBA1C in the last 72 hours. No results for input(s): CHOL, HDL, LDLCALC, TRIG, CHOLHDL, LDLDIRECT in the last 72 hours. No results for input(s): TSH, T4TOTAL, T3FREE, THYROIDAB in the last 72 hours.  Invalid input(s): FREET3 No results for input(s): VITAMINB12, FOLATE, FERRITIN, TIBC, IRON, RETICCTPCT in the last 72 hours.  Micro Results: No results found for this or any previous visit (from the past 240 hour(s)).   Radiological Exams on Admission: Dg Chest 2 View  Result Date:  12/22/2016 CLINICAL DATA:  Syncope with fall today. EXAM: CHEST  2 VIEW COMPARISON:  07/28/2015 and chest CT 01/15/2013 FINDINGS: Lungs are adequately inflated without focal lobar consolidation or effusion. Stable increased interstitial markings over the lung bases. Stable moderate cardiomegaly. Stable marked prominence of the main pulmonary artery segment. Stable known pleural base mass over the posterolateral left thorax. Degenerative change of the spine. IMPRESSION: No acute cardiopulmonary disease. Stable chronic bibasilar interstitial changes. Stable moderate cardiomegaly and stable moderate prominence of the main pulmonary artery suggesting pulmonary arterial hypertension. Stable known pleural based mass over the posterolateral left thorax. Electronically Signed   By: Marin Olp M.D.   On: 12/22/2016 16:16   Dg Forearm Right  Result Date: 12/22/2016 CLINICAL DATA:  Fall. EXAM: RIGHT FOREARM - 2 VIEW COMPARISON:  None. FINDINGS: Surgical screw extending from lateral to medial over the distal radius intact. No evidence of acute fracture  or dislocation. Degenerative changes over the carpal bones. IMPRESSION: No acute findings. Electronically Signed   By: Marin Olp M.D.   On: 12/22/2016 16:17   Ct Head Wo Contrast  Result Date: 12/22/2016 CLINICAL DATA:  Fall 5 days ago, headache EXAM: CT HEAD WITHOUT CONTRAST TECHNIQUE: Contiguous axial images were obtained from the base of the skull through the vertex without intravenous contrast. COMPARISON:  07/28/2015 FINDINGS: Brain: No evidence of acute infarction, hemorrhage, hydrocephalus, extra-axial collection or mass lesion/mass effect. Global cortical atrophy. Subcortical white matter and periventricular small vessel ischemic changes. Vascular: Intracranial atherosclerosis. Skull: Normal. Negative for fracture or focal lesion. Sinuses/Orbits: The visualized paranasal sinuses are essentially clear. The mastoid air cells are unopacified. Other: None.  IMPRESSION: No evidence of acute intracranial abnormality. Atrophy with small vessel ischemic changes. Electronically Signed   By: Julian Hy M.D.   On: 12/22/2016 17:05    Assessment/Plan Present on Admission: .possible LOC (loss of consciousness) ([place]) -bring for overnight observation on med telemetry  -Unclear if patient had actual loss of consciousness or was just tired. We'll cycle enzymes and monitor overnight.  -Patient's cardiologist is Dr. Saralyn Pilar.  -Resume home medications   History of V. tach, atrial fibrillation -Stable, home and resumed  Hypertension -Stable, home medications resumed  Diarrhea -C. difficile ordered. No diarrhea since this AM.   Laurana Magistro 12/22/2016, 6:30 PM

## 2016-12-22 NOTE — ED Notes (Signed)
Report given to [facility]

## 2016-12-22 NOTE — Care Management Obs Status (Signed)
[place] NOTIFICATION   Patient Details  Name: Jessica Blackwell MRN: [id number] Date of Birth: [birth year]   Medicare Observation Status Notification Given:  Yes    Ival Bible, RN 12/22/2016, 6:51 PM

## 2016-12-23 DIAGNOSIS — R55 Syncope and collapse: Secondary | ICD-10-CM | POA: Diagnosis not present

## 2016-12-23 LAB — CBC
HCT: 41.6 % (ref 35.0–47.0)
Hemoglobin: 13.3 g/dL (ref 12.0–16.0)
MCH: 23.4 pg — ABNORMAL LOW (ref 26.0–34.0)
MCHC: 31.9 g/dL — AB (ref 32.0–36.0)
MCV: 73.5 fL — AB (ref 80.0–100.0)
PLATELETS: 82 10*3/uL — AB (ref 150–440)
RBC: 5.66 MIL/uL — ABNORMAL HIGH (ref 3.80–5.20)
RDW: 18.9 % — AB (ref 11.5–14.5)
WBC: 6.9 10*3/uL (ref 3.6–11.0)

## 2016-12-23 LAB — BASIC METABOLIC PANEL
Anion gap: 5 (ref 5–15)
BUN: 18 mg/dL (ref 6–20)
CHLORIDE: 107 mmol/L (ref 101–111)
CO2: 25 mmol/L (ref 22–32)
CREATININE: 0.87 mg/dL (ref 0.44–1.00)
Calcium: 9.1 mg/dL (ref 8.9–10.3)
GFR calc Af Amer: >60 mL/min (ref >60)
GFR, EST NON AFRICAN AMERICAN: 57 mL/min — AB (ref >60)
Glucose, Bld: 133 mg/dL — ABNORMAL HIGH (ref 65–99)
Potassium: 4.5 mmol/L (ref 3.5–5.1)
SODIUM: 137 mmol/L (ref 135–145)

## 2016-12-23 LAB — TROPONIN I
TROPONIN I: 0.03 ng/mL — AB (ref <0.03)
Troponin I: 0.04 ng/mL (ref <0.03)

## 2016-12-23 MED ORDER — FUROSEMIDE 20 MG PO TABS: 40.0000 mg | ORAL_TABLET | Freq: Two times a day (BID) | ORAL | 0 refills | Status: DC

## 2016-12-23 NOTE — Progress Notes (Signed)
Patient given discharge teaching and paperwork regarding medications, diet, follow-up appointments and activity. Patient understanding verbalized. No complaints at this time. IV and telemetry discontinued prior to leaving. Skin assessment as previously charted and vitals are stable; on room air. Patient being discharged to home. Caregiver/family present during discharge teaching. No further needs by Care Management. Prescriptions sent to pharmacy.

## 2016-12-23 NOTE — Discharge Summary (Signed)
[facility] [place] NAME: Jessica Blackwell    MR#:  [id number]  DATE OF BIRTH:  01-31-1925  DATE OF ADMISSION:  12/22/2016 ADMITTING PHYSICIAN: Quintella Baton, MD  DATE OF DISCHARGE: 12/23/2017  PRIMARY CARE PHYSICIAN: SPARKS,JEFFREY D, MD    ADMISSION DIAGNOSIS:  Syncope, unspecified syncope type [R55]  DISCHARGE DIAGNOSIS:  Near syncope  SECONDARY DIAGNOSIS:   Past Medical History:  Diagnosis Date  . Arthritis   . CHF (congestive heart failure) ([place])   . Fall at home 07/28/2015  . GERD (gastroesophageal reflux disease)   . H/O bladder infections   . Hypertension   . Neuropathy ([place])    lower extrmities    HOSPITAL COURSE:   83 year old female with a history of systolic heart failure ejection fraction 25-30%, ventricular tachycardia and essential hypertension who presented with near syncope.   1. Near syncope without loss of consciousness: This was due to fatigue. Patient had a long day with family and cooking. There is no evidence of arrhythmia on telemetry monitoring. Her troponins were flat and not indicative of ACS. Cardiology was asked to consult on the patient. No further recommendations.  2. Chronic systolic heart failure without exacerbation: Continue Lasix and metoprolol and Aldactone  3. History of V. tach and atrial fibrillation: This is stable 4. Essential hypertension: Continue Aldactone and metoprolol  DISCHARGE CONDITIONS AND DIET:   Stable Cardiac diet  CONSULTS OBTAINED:  Treatment Team:  Isaias Cowman, MD  DRUG ALLERGIES:  No Known Allergies  DISCHARGE MEDICATIONS:   Current Discharge Medication List    CONTINUE these medications which have CHANGED   Details  furosemide (LASIX) 20 MG tablet Take 2 tablets (40 mg total) by mouth 2 (two) times daily. Qty: 60 tablet, Refills: 0      CONTINUE these medications which have NOT CHANGED   Details  aspirin EC 81 MG tablet Take 81 mg by mouth  daily.    gabapentin (NEURONTIN) 300 MG capsule Take 300 mg by mouth 3 (three) times daily.    metoprolol succinate (TOPROL-XL) 50 MG 24 hr tablet Take 1 tablet (50 mg total) by mouth daily. Qty: 30 tablet, Refills: 0    RABEprazole (ACIPHEX) 20 MG tablet Take 20 mg by mouth daily.    spironolactone (ALDACTONE) 25 MG tablet Take 12.5 mg by mouth daily.      STOP taking these medications     ferrous sulfate 325 (65 FE) MG tablet           Today   CHIEF COMPLAINT:  doing well no SOB or CP   VITAL SIGNS:  Blood pressure 137/61, pulse 89, temperature 98.3 F (36.8 C), temperature source Oral, resp. rate 18, height 5\' 1"  (1.549 m), weight 70 kg (154 lb 6.4 oz), SpO2 (!) 89 %.   REVIEW OF SYSTEMS:  Review of Systems  Constitutional: Negative.  Negative for chills, fever and malaise/fatigue.  HENT: Negative.  Negative for ear discharge, ear pain, hearing loss, nosebleeds and sore throat.   Eyes: Negative.  Negative for blurred vision and pain.  Respiratory: Negative.  Negative for cough, hemoptysis, shortness of breath and wheezing.   Cardiovascular: Negative.  Negative for chest pain, palpitations and leg swelling.  Gastrointestinal: Negative.  Negative for abdominal pain, blood in stool, diarrhea, nausea and vomiting.  Genitourinary: Negative.  Negative for dysuria.  Musculoskeletal: Negative.  Negative for back pain.  Skin: Negative.   Neurological: Negative for dizziness, tremors, speech change, focal weakness, seizures  and headaches.  Endo/Heme/Allergies: Negative.  Does not bruise/bleed easily.  Psychiatric/Behavioral: Negative.  Negative for depression, hallucinations and suicidal ideas.     PHYSICAL EXAMINATION:  GENERAL:  83 y.o.-year-old patient lying in the bed with no acute distress.  NECK:  Supple, no jugular venous distention. No thyroid enlargement, no tenderness.  LUNGS: Normal breath sounds bilaterally, no wheezing, rales,rhonchi  No use of accessory  muscles of respiration.  CARDIOVASCULAR: S1, S2 normal. 4/6 SEM NO rubs, or gallops.  ABDOMEN: Soft, non-tender, non-distended. Bowel sounds present. No organomegaly or mass.  EXTREMITIES: No pedal edema, cyanosis, or clubbing.  PSYCHIATRIC: The patient is alert and oriented x 3.  SKIN: No obvious rash, lesion, or ulcer.   DATA REVIEW:   CBC  Recent Labs Lab 12/23/16 0843  WBC 6.9  HGB 13.3  HCT 41.6  PLT 82*    Chemistries   Recent Labs Lab 12/22/16 1535 12/23/16 0843  NA 137 137  K 4.8 4.5  CL 104 107  CO2 26 25  GLUCOSE 130* 133*  BUN 16 18  CREATININE 0.93 0.87  CALCIUM 9.1 9.1  MG 1.7  --     Cardiac Enzymes  Recent Labs Lab 12/22/16 2058 12/23/16 0259 12/23/16 0843  TROPONINI 0.04* 0.04* 0.03*    Microbiology Results  @[email or web address] @  RADIOLOGY:  Dg Chest 2 View  Result Date: 12/22/2016 CLINICAL DATA:  Syncope with fall today. EXAM: CHEST  2 VIEW COMPARISON:  07/28/2015 and chest CT 01/15/2013 FINDINGS: Lungs are adequately inflated without focal lobar consolidation or effusion. Stable increased interstitial markings over the lung bases. Stable moderate cardiomegaly. Stable marked prominence of the main pulmonary artery segment. Stable known pleural base mass over the posterolateral left thorax. Degenerative change of the spine. IMPRESSION: No acute cardiopulmonary disease. Stable chronic bibasilar interstitial changes. Stable moderate cardiomegaly and stable moderate prominence of the main pulmonary artery suggesting pulmonary arterial hypertension. Stable known pleural based mass over the posterolateral left thorax. Electronically Signed   By: Marin Olp M.D.   On: 12/22/2016 16:16   Dg Forearm Right  Result Date: 12/22/2016 CLINICAL DATA:  Fall. EXAM: RIGHT FOREARM - 2 VIEW COMPARISON:  None. FINDINGS: Surgical screw extending from lateral to medial over the distal radius intact. No evidence of acute fracture or dislocation. Degenerative changes over  the carpal bones. IMPRESSION: No acute findings. Electronically Signed   By: Marin Olp M.D.   On: 12/22/2016 16:17   Ct Head Wo Contrast  Result Date: 12/22/2016 CLINICAL DATA:  Fall 5 days ago, headache EXAM: CT HEAD WITHOUT CONTRAST TECHNIQUE: Contiguous axial images were obtained from the base of the skull through the vertex without intravenous contrast. COMPARISON:  07/28/2015 FINDINGS: Brain: No evidence of acute infarction, hemorrhage, hydrocephalus, extra-axial collection or mass lesion/mass effect. Global cortical atrophy. Subcortical white matter and periventricular small vessel ischemic changes. Vascular: Intracranial atherosclerosis. Skull: Normal. Negative for fracture or focal lesion. Sinuses/Orbits: The visualized paranasal sinuses are essentially clear. The mastoid air cells are unopacified. Other: None. IMPRESSION: No evidence of acute intracranial abnormality. Atrophy with small vessel ischemic changes. Electronically Signed   By: Julian Hy M.D.   On: 12/22/2016 17:05      Current Discharge Medication List    CONTINUE these medications which have CHANGED   Details  furosemide (LASIX) 20 MG tablet Take 2 tablets (40 mg total) by mouth 2 (two) times daily. Qty: 60 tablet, Refills: 0      CONTINUE these medications which have NOT CHANGED  Details  aspirin EC 81 MG tablet Take 81 mg by mouth daily.    gabapentin (NEURONTIN) 300 MG capsule Take 300 mg by mouth 3 (three) times daily.    metoprolol succinate (TOPROL-XL) 50 MG 24 hr tablet Take 1 tablet (50 mg total) by mouth daily. Qty: 30 tablet, Refills: 0    RABEprazole (ACIPHEX) 20 MG tablet Take 20 mg by mouth daily.    spironolactone (ALDACTONE) 25 MG tablet Take 12.5 mg by mouth daily.      STOP taking these medications     ferrous sulfate 325 (65 FE) MG tablet           Management plans discussed with the patient and she is in agreement. Stable for discharge home   Patient should follow up with  pcp  CODE STATUS:     Code Status Orders        Start     Ordered   12/22/16 2053  Full code  Continuous     12/22/16 2052    Code Status History    Date Active Date Inactive Code Status Order ID Comments User Context   07/28/2015  6:38 AM 08/02/2015  6:44 PM Full Code [id number]  Norval Morton, MD Inpatient      TOTAL TIME TAKING CARE OF THIS PATIENT: 37 minutes.    Note: This dictation was prepared with Dragon dictation along with smaller phrase technology. Any transcriptional errors that result from this process are unintentional.  Anushka Hartinger M.D on 12/23/2016 at 10:11 AM  Between 7am to 6pm - Pager - [phone] After 6pm go to [email or web address] - password [email or web address]  Office  [phone]  CC: Primary care physician; Idelle Crouch, MD

## 2016-12-23 NOTE — Consult Note (Signed)
[facility] Cardiology  CARDIOLOGY CONSULT NOTE  Patient ID: Jessica Blackwell MRN: [id number] DOB/AGE: [birth year] 81 y.o.  Admit date: 12/22/2016 Referring Physician Mody Primary Physician [facility] Primary Cardiologist Leilanee Righetti Reason for Consultation Near-syncope  HPI: 84 year old female referred for evaluation of near syncope. Patient was in her usual state of health until 12/22/2016. She reports that she did not go to bed until late the night before. She did. A big meal for her family, apparently dozed off for approximately 15 minutes with difficulty to arouse. EMS was called, the patient was brought to [facility] emergency room where ECG was nondiagnostic. Head CT was negative. The patient was admitted to telemetry where she's had an uncomplicated hospital course. Admission labs were notable for borderline elevated troponin of 0.04, 0.04. The patient denies chest pain shortness of breath. The patient appears to be doing well, blood pressure and heart rate in normal range. Patient asymptomatic.  Review of systems complete and found to be negative unless listed above     Past Medical History:  Diagnosis Date  . Arthritis   . CHF (congestive heart failure) ([place])   . Fall at home 07/28/2015  . GERD (gastroesophageal reflux disease)   . H/O bladder infections   . Hypertension   . Neuropathy ([place])    lower extrmities    Past Surgical History:  Procedure Laterality Date  . ABDOMINAL HYSTERECTOMY    . CATARACT EXTRACTION    . COLON SURGERY    . FOOT SURGERY Right   . ROTATOR CUFF REPAIR Left   . TONSILLECTOMY      Prescriptions Prior to Admission  Medication Sig Dispense Refill Last Dose  . aspirin EC 81 MG tablet Take 81 mg by mouth daily.   12/22/2016 at am  . furosemide (LASIX) 20 MG tablet Take 1 tablet (20 mg total) by mouth daily. (Patient taking differently: Take 40 mg by mouth 2 (two) times daily. ) 30 tablet 0 12/22/2016 at am  . gabapentin (NEURONTIN) 300 MG capsule Take 300 mg by mouth 3  (three) times daily.   12/22/2016 at am  . metoprolol succinate (TOPROL-XL) 50 MG 24 hr tablet Take 1 tablet (50 mg total) by mouth daily. 30 tablet 0 12/22/2016 at am  . RABEprazole (ACIPHEX) 20 MG tablet Take 20 mg by mouth daily.   12/22/2016 at 1200  . spironolactone (ALDACTONE) 25 MG tablet Take 12.5 mg by mouth daily.   12/21/2016 at pm  . ferrous sulfate 325 (65 FE) MG tablet Take 1 tablet (325 mg total) by mouth 3 (three) times daily with meals. (Patient not taking: Reported on 12/22/2016) 90 tablet 3 Not Taking at Unknown time   Social History   Social History  . Marital status: Widowed    Spouse name: N/A  . Number of children: N/A  . Years of education: N/A   Occupational History  . Not on file.   Social History Main Topics  . Smoking status: Never Smoker  . Smokeless tobacco: Never Used  . Alcohol use No  . Drug use: No  . Sexual activity: Not on file   Other Topics Concern  . Not on file   Social History Narrative  . No narrative on file    Family History  Problem Relation Age of Onset  . Hypertension Father       Review of systems complete and found to be negative unless listed above      PHYSICAL EXAM  General: Well developed, well nourished, in no  acute distress HEENT:  Normocephalic and atramatic Neck:  No JVD.  Lungs: Clear bilaterally to auscultation and percussion. Heart: HRRR . Normal S1 and S2 without gallops or murmurs.  Abdomen: Bowel sounds are positive, abdomen soft and non-tender  Msk:  Back normal, normal gait. Normal strength and tone for age. Extremities: No clubbing, cyanosis or edema.   Neuro: Alert and oriented X 3. Psych:  Good affect, responds appropriately  Labs:   Lab Results  Component Value Date   WBC 6.7 12/22/2016   HGB 13.6 12/22/2016   HCT 43.3 12/22/2016   MCV 74.2 (L) 12/22/2016   PLT 89 (L) 12/22/2016    Recent Labs Lab 12/22/16 1535  NA 137  K 4.8  CL 104  CO2 26  BUN 16  CREATININE 0.93  CALCIUM 9.1   GLUCOSE 130*   Lab Results  Component Value Date   CKTOTAL 309 (H) 06/03/2012   CKMB 6.7 (H) 06/03/2012   TROPONINI 0.04 (HH) 12/23/2016    Lab Results  Component Value Date   CHOL 162 06/04/2012   Lab Results  Component Value Date   HDL 29 (L) 06/04/2012   Lab Results  Component Value Date   LDLCALC 103 (H) 06/04/2012   Lab Results  Component Value Date   TRIG 151 06/04/2012   No results found for: CHOLHDL No results found for: LDLDIRECT    Radiology: Dg Chest 2 View  Result Date: 12/22/2016 CLINICAL DATA:  Syncope with fall today. EXAM: CHEST  2 VIEW COMPARISON:  07/28/2015 and chest CT 01/15/2013 FINDINGS: Lungs are adequately inflated without focal lobar consolidation or effusion. Stable increased interstitial markings over the lung bases. Stable moderate cardiomegaly. Stable marked prominence of the main pulmonary artery segment. Stable known pleural base mass over the posterolateral left thorax. Degenerative change of the spine. IMPRESSION: No acute cardiopulmonary disease. Stable chronic bibasilar interstitial changes. Stable moderate cardiomegaly and stable moderate prominence of the main pulmonary artery suggesting pulmonary arterial hypertension. Stable known pleural based mass over the posterolateral left thorax. Electronically Signed   By: Marin Olp M.D.   On: 12/22/2016 16:16   Dg Forearm Right  Result Date: 12/22/2016 CLINICAL DATA:  Fall. EXAM: RIGHT FOREARM - 2 VIEW COMPARISON:  None. FINDINGS: Surgical screw extending from lateral to medial over the distal radius intact. No evidence of acute fracture or dislocation. Degenerative changes over the carpal bones. IMPRESSION: No acute findings. Electronically Signed   By: Marin Olp M.D.   On: 12/22/2016 16:17   Ct Head Wo Contrast  Result Date: 12/22/2016 CLINICAL DATA:  Fall 5 days ago, headache EXAM: CT HEAD WITHOUT CONTRAST TECHNIQUE: Contiguous axial images were obtained from the base of the skull through  the vertex without intravenous contrast. COMPARISON:  07/28/2015 FINDINGS: Brain: No evidence of acute infarction, hemorrhage, hydrocephalus, extra-axial collection or mass lesion/mass effect. Global cortical atrophy. Subcortical white matter and periventricular small vessel ischemic changes. Vascular: Intracranial atherosclerosis. Skull: Normal. Negative for fracture or focal lesion. Sinuses/Orbits: The visualized paranasal sinuses are essentially clear. The mastoid air cells are unopacified. Other: None. IMPRESSION: No evidence of acute intracranial abnormality. Atrophy with small vessel ischemic changes. Electronically Signed   By: Julian Hy M.D.   On: 12/22/2016 17:05    EKG: Sinus rhythm with left bundle branch block  ASSESSMENT AND PLAN:   1. Near syncope, atypical, without loss of consciousness, without chest pain or shortness of breath, negative troponin, unremarkable ECG, negative head CT, currently clinically and hemodynamically stable  Recommendations  1. Agree with current therapy 2. Ambulate, if patient does well, consider discharge home 3. Follow-up as outpatient  Signed off for now, please call if any questions    Signed: Isaias Cowman MD,PhD, [facility] 12/23/2016, 9:08 AM

## 2016-12-23 NOTE — Discharge Instructions (Signed)
Heart Failure Clinic appointment on December 30, 2016 at 9:00am with Darylene Price, [place]. Please call [phone] to reschedule.

## 2016-12-23 NOTE — Care Management (Signed)
No discharge needs identified by members of the care team 

## 2016-12-23 NOTE — Progress Notes (Signed)
Patient ambulated with walker and stand by assist. Says she feels at her baseline - no weakness or dizziness.

## 2016-12-30 ENCOUNTER — Ambulatory Visit: Payer: Medicare Other | Admitting: Family

## 2017-08-01 ENCOUNTER — Encounter (INDEPENDENT_AMBULATORY_CARE_PROVIDER_SITE_OTHER): Payer: Self-pay | Admitting: Vascular Surgery

## 2017-09-05 ENCOUNTER — Encounter (INDEPENDENT_AMBULATORY_CARE_PROVIDER_SITE_OTHER): Payer: Self-pay | Admitting: Vascular Surgery

## 2017-10-07 ENCOUNTER — Encounter (INDEPENDENT_AMBULATORY_CARE_PROVIDER_SITE_OTHER): Payer: Self-pay | Admitting: Vascular Surgery

## 2017-10-17 ENCOUNTER — Encounter (INDEPENDENT_AMBULATORY_CARE_PROVIDER_SITE_OTHER): Payer: Self-pay | Admitting: Vascular Surgery

## 2018-02-20 ENCOUNTER — Ambulatory Visit
Admission: RE | Admit: 2018-02-20 | Discharge: 2018-02-20 | Disposition: A | Discharge status: Home / Self Care | Payer: Medicare Other | Source: Ambulatory Visit | Attending: Internal Medicine | Admitting: Internal Medicine

## 2018-02-20 ENCOUNTER — Other Ambulatory Visit: Payer: Self-pay | Admitting: Internal Medicine

## 2018-02-20 DIAGNOSIS — K5750 Diverticulosis of both small and large intestine without perforation or abscess without bleeding: Secondary | ICD-10-CM | POA: Insufficient documentation

## 2018-02-20 DIAGNOSIS — R1084 Generalized abdominal pain: Secondary | ICD-10-CM | POA: Diagnosis present

## 2018-02-20 DIAGNOSIS — R110 Nausea: Secondary | ICD-10-CM

## 2018-02-20 DIAGNOSIS — I700 Atherosclerosis of aorta: Secondary | ICD-10-CM | POA: Diagnosis not present

## 2018-02-20 DIAGNOSIS — N200 Calculus of kidney: Secondary | ICD-10-CM | POA: Diagnosis not present

## 2018-02-20 DIAGNOSIS — K2980 Duodenitis without bleeding: Secondary | ICD-10-CM | POA: Insufficient documentation

## 2018-02-20 DIAGNOSIS — K8020 Calculus of gallbladder without cholecystitis without obstruction: Secondary | ICD-10-CM | POA: Diagnosis not present

## 2018-02-20 LAB — POCT I-STAT CREATININE: Creatinine, Ser: 0.7 mg/dL (ref 0.44–1.00)

## 2018-02-20 MED ORDER — IOHEXOL 300 MG/ML  SOLN
100.0000 mL | Freq: Once | INTRAMUSCULAR | Status: AC | PRN
  Administered 2018-02-20: 100 mL via INTRAVENOUS

## 2018-05-20 ENCOUNTER — Inpatient Hospital Stay
Admit: 2018-05-20 | Discharge: 2018-05-20 | Disposition: A | Discharge status: Home / Self Care | Payer: Medicare Other | Attending: Internal Medicine | Admitting: Internal Medicine

## 2018-05-20 ENCOUNTER — Other Ambulatory Visit: Payer: Self-pay

## 2018-05-20 ENCOUNTER — Emergency Department: Payer: Medicare Other

## 2018-05-20 ENCOUNTER — Encounter: Payer: Self-pay | Admitting: Emergency Medicine

## 2018-05-20 ENCOUNTER — Inpatient Hospital Stay
Admission: EM | Admit: 2018-05-20 | Discharge: 2018-05-26 | DRG: 291 | Disposition: A | Discharge status: Skilled Nursing Facility | Payer: Medicare Other | Attending: Internal Medicine | Admitting: Internal Medicine

## 2018-05-20 DIAGNOSIS — S41112A Laceration without foreign body of left upper arm, initial encounter: Secondary | ICD-10-CM | POA: Diagnosis present

## 2018-05-20 DIAGNOSIS — R0902 Hypoxemia: Secondary | ICD-10-CM | POA: Diagnosis present

## 2018-05-20 DIAGNOSIS — R918 Other nonspecific abnormal finding of lung field: Secondary | ICD-10-CM | POA: Diagnosis present

## 2018-05-20 DIAGNOSIS — Z79899 Other long term (current) drug therapy: Secondary | ICD-10-CM | POA: Diagnosis not present

## 2018-05-20 DIAGNOSIS — I110 Hypertensive heart disease with heart failure: Secondary | ICD-10-CM | POA: Diagnosis present

## 2018-05-20 DIAGNOSIS — T501X5A Adverse effect of loop [high-ceiling] diuretics, initial encounter: Secondary | ICD-10-CM | POA: Diagnosis not present

## 2018-05-20 DIAGNOSIS — W1830XA Fall on same level, unspecified, initial encounter: Secondary | ICD-10-CM | POA: Diagnosis not present

## 2018-05-20 DIAGNOSIS — R112 Nausea with vomiting, unspecified: Secondary | ICD-10-CM

## 2018-05-20 DIAGNOSIS — Y92000 Kitchen of unspecified non-institutional (private) residence as  the place of occurrence of the external cause: Secondary | ICD-10-CM | POA: Diagnosis not present

## 2018-05-20 DIAGNOSIS — K311 Adult hypertrophic pyloric stenosis: Secondary | ICD-10-CM | POA: Diagnosis present

## 2018-05-20 DIAGNOSIS — I5023 Acute on chronic systolic (congestive) heart failure: Secondary | ICD-10-CM | POA: Diagnosis present

## 2018-05-20 DIAGNOSIS — Z888 Allergy status to other drugs, medicaments and biological substances status: Secondary | ICD-10-CM | POA: Diagnosis not present

## 2018-05-20 DIAGNOSIS — N179 Acute kidney failure, unspecified: Secondary | ICD-10-CM | POA: Diagnosis not present

## 2018-05-20 DIAGNOSIS — M1990 Unspecified osteoarthritis, unspecified site: Secondary | ICD-10-CM | POA: Diagnosis present

## 2018-05-20 DIAGNOSIS — Z885 Allergy status to narcotic agent status: Secondary | ICD-10-CM

## 2018-05-20 DIAGNOSIS — J9601 Acute respiratory failure with hypoxia: Secondary | ICD-10-CM | POA: Diagnosis present

## 2018-05-20 DIAGNOSIS — K219 Gastro-esophageal reflux disease without esophagitis: Secondary | ICD-10-CM | POA: Diagnosis present

## 2018-05-20 DIAGNOSIS — R110 Nausea: Secondary | ICD-10-CM

## 2018-05-20 DIAGNOSIS — W19XXXA Unspecified fall, initial encounter: Secondary | ICD-10-CM

## 2018-05-20 DIAGNOSIS — Z8249 Family history of ischemic heart disease and other diseases of the circulatory system: Secondary | ICD-10-CM | POA: Diagnosis not present

## 2018-05-20 DIAGNOSIS — I248 Other forms of acute ischemic heart disease: Secondary | ICD-10-CM | POA: Diagnosis present

## 2018-05-20 DIAGNOSIS — E1165 Type 2 diabetes mellitus with hyperglycemia: Secondary | ICD-10-CM | POA: Diagnosis present

## 2018-05-20 DIAGNOSIS — Z66 Do not resuscitate: Secondary | ICD-10-CM | POA: Diagnosis present

## 2018-05-20 DIAGNOSIS — Z7982 Long term (current) use of aspirin: Secondary | ICD-10-CM | POA: Diagnosis not present

## 2018-05-20 DIAGNOSIS — Z4659 Encounter for fitting and adjustment of other gastrointestinal appliance and device: Secondary | ICD-10-CM

## 2018-05-20 DIAGNOSIS — I471 Supraventricular tachycardia: Secondary | ICD-10-CM | POA: Diagnosis not present

## 2018-05-20 DIAGNOSIS — I350 Nonrheumatic aortic (valve) stenosis: Secondary | ICD-10-CM | POA: Diagnosis present

## 2018-05-20 DIAGNOSIS — R1110 Vomiting, unspecified: Secondary | ICD-10-CM

## 2018-05-20 DIAGNOSIS — Z23 Encounter for immunization: Secondary | ICD-10-CM

## 2018-05-20 DIAGNOSIS — G629 Polyneuropathy, unspecified: Secondary | ICD-10-CM | POA: Diagnosis present

## 2018-05-20 LAB — BASIC METABOLIC PANEL
ANION GAP: 9 (ref 5–15)
BUN: 9 mg/dL (ref 8–23)
CO2: 26 mmol/L (ref 22–32)
CREATININE: 0.6 mg/dL (ref 0.44–1.00)
Calcium: 9.2 mg/dL (ref 8.9–10.3)
Chloride: 105 mmol/L (ref 98–111)
GFR calc Af Amer: >60 mL/min (ref >60)
GFR calc non Af Amer: >60 mL/min (ref >60)
GLUCOSE: 164 mg/dL — AB (ref 70–99)
POTASSIUM: 3.9 mmol/L (ref 3.5–5.1)
Sodium: 140 mmol/L (ref 135–145)

## 2018-05-20 LAB — CBC WITH DIFFERENTIAL/PLATELET
BASOS ABS: 0.1 10*3/uL (ref 0–0.1)
Basophils Relative: 1 %
EOS PCT: 1 %
Eosinophils Absolute: 0.1 10*3/uL (ref 0–0.7)
HCT: 43.6 % (ref 35.0–47.0)
Hemoglobin: 14 g/dL (ref 12.0–16.0)
LYMPHS PCT: 19 %
Lymphs Abs: 1.2 10*3/uL (ref 1.0–3.6)
MCH: 26 pg (ref 26.0–34.0)
MCHC: 32.2 g/dL (ref 32.0–36.0)
MCV: 80.5 fL (ref 80.0–100.0)
Monocytes Absolute: 0.4 10*3/uL (ref 0.2–0.9)
Monocytes Relative: 6 %
NEUTROS PCT: 73 %
Neutro Abs: 4.5 10*3/uL (ref 1.4–6.5)
PLATELETS: 114 10*3/uL — AB (ref 150–440)
RBC: 5.41 MIL/uL — AB (ref 3.80–5.20)
RDW: 15.2 % — ABNORMAL HIGH (ref 11.5–14.5)
WBC: 6.2 10*3/uL (ref 3.6–11.0)

## 2018-05-20 LAB — TROPONIN I: Troponin I: 0.04 ng/mL (ref <0.03)

## 2018-05-20 LAB — BRAIN NATRIURETIC PEPTIDE: B NATRIURETIC PEPTIDE 5: 243 pg/mL — AB (ref 0.0–100.0)

## 2018-05-20 MED ORDER — TETANUS-DIPHTH-ACELL PERTUSSIS 5-2.5-18.5 LF-MCG/0.5 IM SUSP
0.5000 mL | Freq: Once | INTRAMUSCULAR | Status: AC
  Administered 2018-05-20: 0.5 mL via INTRAMUSCULAR
  Filled 2018-05-20: qty 0.5

## 2018-05-20 MED ORDER — GABAPENTIN 300 MG PO CAPS
300.0000 mg | ORAL_CAPSULE | Freq: Every day | ORAL | Status: DC
  Administered 2018-05-23 – 2018-05-26 (×4): 300 mg via ORAL
  Filled 2018-05-20 (×3): qty 1

## 2018-05-20 MED ORDER — IRBESARTAN 75 MG PO TABS
37.5000 mg | ORAL_TABLET | Freq: Every day | ORAL | Status: DC
  Filled 2018-05-20 (×2): qty 0.5

## 2018-05-20 MED ORDER — ONDANSETRON HCL 4 MG PO TABS: 4.0000 mg | ORAL_TABLET | Freq: Four times a day (QID) | ORAL | Status: DC | PRN

## 2018-05-20 MED ORDER — FUROSEMIDE 10 MG/ML IJ SOLN
20.0000 mg | Freq: Once | INTRAMUSCULAR | Status: AC
  Administered 2018-05-20: 20 mg via INTRAVENOUS
  Filled 2018-05-20: qty 4

## 2018-05-20 MED ORDER — ONDANSETRON HCL 4 MG/2ML IJ SOLN
4.0000 mg | Freq: Four times a day (QID) | INTRAMUSCULAR | Status: DC | PRN
  Administered 2018-05-20 – 2018-05-21 (×2): 4 mg via INTRAVENOUS
  Filled 2018-05-20 (×2): qty 2

## 2018-05-20 MED ORDER — SPIRONOLACTONE 25 MG PO TABS
12.5000 mg | ORAL_TABLET | Freq: Every day | ORAL | Status: DC
  Administered 2018-05-24 – 2018-05-26 (×3): 12.5 mg via ORAL
  Filled 2018-05-20: qty 1
  Filled 2018-05-20 (×2): qty 0.5
  Filled 2018-05-20: qty 1
  Filled 2018-05-20 (×2): qty 0.5
  Filled 2018-05-20: qty 1
  Filled 2018-05-20 (×2): qty 0.5

## 2018-05-20 MED ORDER — ACETAMINOPHEN 650 MG RE SUPP: 650.0000 mg | Freq: Four times a day (QID) | RECTAL | Status: DC | PRN

## 2018-05-20 MED ORDER — IPRATROPIUM-ALBUTEROL 0.5-2.5 (3) MG/3ML IN SOLN
3.0000 mL | Freq: Four times a day (QID) | RESPIRATORY_TRACT | Status: DC
  Administered 2018-05-20 – 2018-05-21 (×3): 3 mL via RESPIRATORY_TRACT
  Filled 2018-05-20 (×6): qty 3

## 2018-05-20 MED ORDER — ACETAMINOPHEN 325 MG PO TABS
650.0000 mg | ORAL_TABLET | Freq: Four times a day (QID) | ORAL | Status: DC | PRN
  Administered 2018-05-20 – 2018-05-23 (×2): 650 mg via ORAL
  Filled 2018-05-20 (×2): qty 2

## 2018-05-20 MED ORDER — HYDROXYZINE HCL 25 MG PO TABS
25.0000 mg | ORAL_TABLET | Freq: Three times a day (TID) | ORAL | Status: DC | PRN
  Administered 2018-05-20: 25 mg via ORAL
  Filled 2018-05-20 (×2): qty 1

## 2018-05-20 MED ORDER — METOPROLOL SUCCINATE ER 50 MG PO TB24: 50.0000 mg | ORAL_TABLET | Freq: Every day | ORAL | Status: DC

## 2018-05-20 MED ORDER — ASPIRIN EC 81 MG PO TBEC
81.0000 mg | DELAYED_RELEASE_TABLET | Freq: Every day | ORAL | Status: DC
  Administered 2018-05-23 – 2018-05-26 (×4): 81 mg via ORAL
  Filled 2018-05-20 (×3): qty 1

## 2018-05-20 MED ORDER — ENOXAPARIN SODIUM 30 MG/0.3ML ~~LOC~~ SOLN
30.0000 mg | SUBCUTANEOUS | Status: DC
  Administered 2018-05-20 – 2018-05-24 (×5): 30 mg via SUBCUTANEOUS
  Filled 2018-05-20 (×6): qty 0.3

## 2018-05-20 MED ORDER — PANTOPRAZOLE SODIUM 40 MG PO TBEC: 40.0000 mg | DELAYED_RELEASE_TABLET | Freq: Every day | ORAL | Status: DC

## 2018-05-20 MED ORDER — FUROSEMIDE 10 MG/ML IJ SOLN
20.0000 mg | Freq: Two times a day (BID) | INTRAMUSCULAR | Status: DC
  Administered 2018-05-21 (×2): 20 mg via INTRAVENOUS
  Filled 2018-05-20 (×2): qty 2

## 2018-05-20 NOTE — Progress Notes (Signed)
Notified MD of pt requesting a fan. Also would like something to help rest. Orders placed. Will continue to monitor.

## 2018-05-20 NOTE — Progress Notes (Signed)
Patient ID: Jessica Blackwell, female   DOB: Jan 29, 1925, 84 y.o.   MRN: [id number]  ACP note  Patient and granddaughter at the bedside  Diagnosis: Acute hypoxic respiratory failure, acute on chronic systolic congestive heart failure,Neuropathy, GERD, weakness, impaired fasting glucose, chronic mid lower lung mass.  CODE STATUS discussed and patient wishes to be a DO NOT RESUSCITATE  Plan.  Diuresis with IV Lasix.  Try to taper off oxygen.  Nebulizer treatments and incentive spirometry and physical therapy evaluation  Time spent on ACP discussion 17 minutes Dr. Loletha Grayer

## 2018-05-20 NOTE — ED Provider Notes (Addendum)
[facility] Emergency Department Provider Note ____________________________________________   First MD Initiated Contact with Patient 05/20/18 1059     (approximate)  I have reviewed the triage vital signs and the nursing notes.   HISTORY  Chief Complaint Fall  HPI CHANNELLE BOTTGER is a 84 y.o. female history of CHF as well as ambulatory dysfunction using a walker who is presenting to the emergency department today after a fall.  The patient says that she does not know exactly how she fell.  She was walking to her kitchen table with her walker when she says that she fell backwards.  She does not member tripping or losing balance.  She says that she landed flat on her back without hitting her head or neck.  Denies any headache or neck pain at this time.  Says that she was unable to get up off the floor which she says will be typical for her after any fall.  She says that she is having low back pain at this time with movement but none when she is still.  Found to be 8586% on room air initially and started on 4 L of nasal cannula oxygen.  She denied any shortness of breath or chest pain prior to the fall.  Past Medical History:  Diagnosis Date  . Arthritis   . CHF (congestive heart failure) ([place])   . Fall at home 07/28/2015  . GERD (gastroesophageal reflux disease)   . H/O bladder infections   . Hypertension   . Neuropathy    lower extrmities    Patient Active Problem List   Diagnosis Date Noted  . LOC (loss of consciousness) ([place]) 12/22/2016  . AKI (acute kidney injury) ([place]) 08/01/2015  . V-tach ([place]) 08/01/2015  . NSVT (nonsustained ventricular tachycardia) ([place])   . Persistent atrial fibrillation ([place])   . Acute systolic congestive heart failure ([place]) 07/31/2015  . Acute respiratory failure with hypoxia ([place]) 07/28/2015  . Fall at home 07/28/2015  . GERD (gastroesophageal reflux disease) 07/28/2015  . HTN (hypertension) 07/28/2015  . Anemia  07/28/2015    Past Surgical History:  Procedure Laterality Date  . ABDOMINAL HYSTERECTOMY    . CATARACT EXTRACTION    . COLON SURGERY    . FOOT SURGERY Right   . ROTATOR CUFF REPAIR Left   . TONSILLECTOMY      Prior to Admission medications   Medication Sig Start Date End Date Taking? Authorizing Provider  aspirin EC 81 MG tablet Take 81 mg by mouth daily.    [provider]  furosemide (LASIX) 20 MG tablet Take 2 tablets (40 mg total) by mouth 2 (two) times daily. 12/23/16   Bettey Costa, MD  gabapentin (NEURONTIN) 300 MG capsule Take 300 mg by mouth 3 (three) times daily.    [provider]  metoprolol succinate (TOPROL-XL) 50 MG 24 hr tablet Take 1 tablet (50 mg total) by mouth daily. 08/02/15   Debbe Odea, MD  RABEprazole (ACIPHEX) 20 MG tablet Take 20 mg by mouth daily.    [provider]  spironolactone (ALDACTONE) 25 MG tablet Take 12.5 mg by mouth daily.    [provider]    Allergies Codeine; Ciprofloxacin; and Sulfamethoxazole-trimethoprim  Family History  Problem Relation Age of Onset  . Hypertension Father     Social History Social History   Tobacco Use  . Smoking status: Never Smoker  . Smokeless tobacco: Never Used  Substance Use Topics  . Alcohol use: No  .  Drug use: No    Review of Systems  Constitutional: No fever/chills Eyes: No visual changes. ENT: No sore throat. Cardiovascular: Denies chest pain. Respiratory: Denies shortness of breath. Gastrointestinal: No abdominal pain.  No nausea, no vomiting.  No diarrhea.  No constipation. Genitourinary: Negative for dysuria. Musculoskeletal: As above Skin: Negative for rash. Neurological: Negative for headaches, focal weakness or numbness.   ____________________________________________   PHYSICAL EXAM:  VITAL SIGNS: ED Triage Vitals  Enc Vitals Group     BP 05/20/18 1055 (!) 185/100     Pulse Rate 05/20/18 1055 96     Resp 05/20/18 1055 (!) 22     Temp  05/20/18 1055 98.3 F (36.8 C)     Temp Source 05/20/18 1055 Oral     SpO2 05/20/18 1055 (!) 86 %     Weight 05/20/18 1056 142 lb (64.4 kg)     Height 05/20/18 1056 5\' 5"  (1.651 m)     Head Circumference --      Peak Flow --      Pain Score 05/20/18 1056 9     Pain Loc --      Pain Edu? --      Excl. in [place]? --     Constitutional: Alert and oriented. Well appearing and in no acute distress. Eyes: Conjunctivae are normal.  Head: Atraumatic. Nose: No congestion/rhinnorhea.  Wearing nasal cannula oxygen. Mouth/Throat: Mucous membranes are moist.  Neck: No stridor.  No tenderness to the cervical spine.  No deformity or step-off. Cardiovascular: Normal rate, regular rhythm. Grossly normal heart sounds.  Respiratory: Normal respiratory effort.  No retractions. Lungs CTAB. Gastrointestinal: Soft and nontender. No distention. No CVA tenderness. Musculoskeletal: No lower extremity tenderness nor edema.  No joint effusions.  5 out of 5 strength to bilateral lower externally's.  Pelvis is stable without any hip tenderness to palpation.  Minimal tenderness to the low lumbar region, diffusely but without any deformity or step-off.  No thoracic tenderness to palpation.  No tenderness to the chest wall.  No crepitus or deformity. Neurologic:  Normal speech and language. No gross focal neurologic deficits are appreciated. Skin: 2 cm, V shaped superficial skin tear to the dorsum of the left elbow.  No active bleeding.  No bony tenderness to palpation. Psychiatric: Mood and affect are normal. Speech and behavior are normal.  ____________________________________________   LABS (all labs ordered are listed, but only abnormal results are displayed)  Labs Reviewed  BRAIN NATRIURETIC PEPTIDE  CBC WITH DIFFERENTIAL/PLATELET  BASIC METABOLIC PANEL  TROPONIN I  URINALYSIS, COMPLETE (UACMP) WITH MICROSCOPIC   ____________________________________________  EKG  ED ECG REPORT I, Doran Stabler, the  attending physician, personally viewed and interpreted this ECG.   Date: 05/20/2018  EKG Time: 1101  Rate: 90  Rhythm: normal sinus rhythm  Axis: Normal  Intervals:left bundle branch block  ST&T Change: T wave inversions in 1, aVL as well as minimal ST depressions in V5 and V6.  No significant change from previous.  EKG machine reads a STEMI but this is likely secondary to poor baseline.  We will repeat EKG.  ED ECG REPORT I, Doran Stabler, the attending physician, personally viewed and interpreted this ECG.   Date: 05/20/2018  EKG Time: 1104  Rate: 93  Rhythm: normal sinus rhythm  Axis: Normal  Intervals:left bundle branch block  ST&T Change: T wave inversions in 1 as well as aVL.  Persistent minimal ST depressions in V5 and V6.  No significant change from  previous.  ____________________________________________  RADIOLOGY  No acute finding the CT of the abdomen and pelvis.  Chest x-ray with increased prominence of the hila. ____________________________________________   PROCEDURES  Procedure(s) performed:   Procedures  Critical Care performed:   ____________________________________________   INITIAL IMPRESSION / ASSESSMENT AND PLAN / ED COURSE  Pertinent labs & imaging results that were available during my care of the patient were reviewed by me and considered in my medical decision making (see chart for details).  DDX: CHF, pneumonia, pulmonary embolus, lumbar spine fracture, lumbar contusion, arrhythmia, ambulatory dysfunction As part of my medical decision making, I reviewed the following data within the [place] outpatient records.   ----------------------------------------- 1:23 PM on 05/20/2018 -----------------------------------------  Patient back up to 3 L of nasal cannula supplemental oxygen.  Increased hila possibly secondary to pulmonary hypertension.  Will give IV Lasix.  Admit to the hospital.  Signed out to Dr.  Verdell Carmine. ____________________________________________   FINAL CLINICAL IMPRESSION(S) / ED DIAGNOSES  Fall.  Hypoxia.  NEW MEDICATIONS STARTED DURING THIS VISIT:  New Prescriptions   No medications on file     Note:  This document was prepared using Dragon voice recognition software and may include unintentional dictation errors.     Doyce Stonehouse, Randall An, MD 05/20/18 1324  Patient unaware of date of her last tetanus shot.  Granddaughter at bedside as well and does not know.  Checked recent records of Dr. Doy Hutching I do not see documentation of the date of the last tetanus shot.  We will update it.   Orbie Pyo, MD 05/20/18 1350

## 2018-05-20 NOTE — Plan of Care (Signed)

## 2018-05-20 NOTE — ED Triage Notes (Addendum)
Pt arrived via EMS from home s/p fall. Pt states she was going back to sit on a chair when she missed the chair and fell flat onto the kitchen floor. Pt denies hitting head and no LOC. Pt denies any dizziness prior to falling.  Pt is alert and oriented. Pt also has skin tear to left elbow that is bandaged up.  Pt c/o mid back pain and low back pain as well.

## 2018-05-20 NOTE — H&P (Signed)
[facility] [place] NAME: Jessica Blackwell    MR#:  [id number]  DATE OF BIRTH:  Aug 31, 1925  DATE OF ADMISSION:  05/20/2018  PRIMARY CARE PHYSICIAN: Idelle Crouch, MD   REQUESTING/REFERRING PHYSICIAN: Dr Clearnce Hasten  CHIEF COMPLAINT:   Chief Complaint  Patient presents with  . Fall    HISTORY OF PRESENT ILLNESS:  Jessica Blackwell  is a 84 y.o. female with a known history of congestive heart failure and hypertension neuropathy presents with a fall at home.  She was getting ready to eat breakfast she walked with her walker and tried to sit down and before she knew it she ended up on the ground.  She came to the ER to get checked out.  She has a left arm skin tear.  She was found to have a pulse ox of 86% on room air and was placed on oxygen.  Hospitalist services were contacted for further evaluation.  She has been feeling weak for quite a while.  She cut her gabapentin down to once a day.  PAST MEDICAL HISTORY:   Past Medical History:  Diagnosis Date  . Arthritis   . CHF (congestive heart failure) ([place])   . Fall at home 07/28/2015  . GERD (gastroesophageal reflux disease)   . H/O bladder infections   . Hypertension   . Neuropathy    lower extrmities    PAST SURGICAL HISTORY:   Past Surgical History:  Procedure Laterality Date  . ABDOMINAL HYSTERECTOMY    . CATARACT EXTRACTION    . COLON SURGERY    . FOOT SURGERY Right   . ROTATOR CUFF REPAIR Left   . TONSILLECTOMY      SOCIAL HISTORY:   Social History   Tobacco Use  . Smoking status: Never Smoker  . Smokeless tobacco: Never Used  Substance Use Topics  . Alcohol use: No    FAMILY HISTORY:   Family History  Problem Relation Age of Onset  . Hypertension Father   . CAD Mother     DRUG ALLERGIES:   Allergies  Allergen Reactions  . Codeine Other (See Comments)    Upset stomach  . Ciprofloxacin Itching and Rash  . Sulfamethoxazole-Trimethoprim Rash     REVIEW OF SYSTEMS:  CONSTITUTIONAL: No fever, chills or sweats.  Positive for weakness.  EYES: No blurred or double vision.  EARS, NOSE, AND THROAT: No tinnitus or ear pain. No sore throat RESPIRATORY: No cough, shortness of breath, wheezing or hemoptysis.  CARDIOVASCULAR: No chest pain, orthopnea, edema.  GASTROINTESTINAL: No nausea, vomiting.  Some diarrhea and abdominal pain ending on Monday. No blood in bowel movements GENITOURINARY: No dysuria, hematuria.  ENDOCRINE: No polyuria, nocturia,  HEMATOLOGY: No anemia, easy bruising or bleeding SKIN: No rash or lesion. MUSCULOSKELETAL: No joint pain or arthritis.   NEUROLOGIC: Neuropathy on the legs. PSYCHIATRY: No anxiety or depression.   MEDICATIONS AT HOME:   Prior to Admission medications   Medication Sig Start Date End Date Taking? Authorizing Provider  aspirin EC 81 MG tablet Take 81 mg by mouth daily.   Yes [provider]  furosemide (LASIX) 20 MG tablet Take 2 tablets (40 mg total) by mouth 2 (two) times daily. 12/23/16  Yes Mody, Ulice Bold, MD  gabapentin (NEURONTIN) 300 MG capsule Take 300 mg by mouth 3 (three) times daily.   Yes [provider]  metoprolol succinate (TOPROL-XL) 50 MG 24 hr tablet Take 1 tablet (50 mg total) by mouth daily.  08/02/15  Yes Debbe Odea, MD  pantoprazole (PROTONIX) 40 MG tablet Take 40 mg by mouth daily.   Yes [provider]  spironolactone (ALDACTONE) 25 MG tablet Take 12.5 mg by mouth daily.   Yes [provider]      VITAL SIGNS:  Blood pressure (!) 149/77, pulse 89, temperature 98.3 F (36.8 C), temperature source Oral, resp. rate (!) 28, height 5\' 5"  (1.651 m), weight 64.4 kg, SpO2 92 %.  PHYSICAL EXAMINATION:  GENERAL:  84 y.o.-year-old patient lying in the bed with no acute distress.  EYES: Pupils equal, round, reactive to light and accommodation. No scleral icterus. Extraocular muscles intact.  HEENT: Head atraumatic, normocephalic. Oropharynx  and nasopharynx clear.  NECK:  Supple, no jugular venous distention. No thyroid enlargement, no tenderness.  LUNGS: Normal breath sounds bilaterally, no wheezing, rales,rhonchi or crepitation. No use of accessory muscles of respiration.  CARDIOVASCULAR: S1, S2 normal. 3/6 systolic murmur, no rubs, or gallops.  ABDOMEN: Soft, nontender, nondistended. Bowel sounds present. No organomegaly or mass.  EXTREMITIES: No pedal edema, cyanosis, or clubbing.  NEUROLOGIC: Cranial nerves II through XII are intact. Muscle strength 5/5 in all extremities. Sensation intact. Gait not checked.  PSYCHIATRIC: The patient is alert and oriented x 3.  SKIN: No rash, lesion, or ulcer.   LABORATORY PANEL:   CBC Recent Labs  Lab 05/20/18 1130  WBC 6.2  HGB 14.0  HCT 43.6  PLT 114*   ------------------------------------------------------------------------------------------------------------------  Chemistries  Recent Labs  Lab 05/20/18 1130  NA 140  K 3.9  CL 105  CO2 26  GLUCOSE 164*  BUN 9  CREATININE 0.60  CALCIUM 9.2   ------------------------------------------------------------------------------------------------------------------  Cardiac Enzymes Recent Labs  Lab 05/20/18 1130  TROPONINI 0.04*   ------------------------------------------------------------------------------------------------------------------  RADIOLOGY:  Ct Abdomen Pelvis Wo Contrast  Result Date: 05/20/2018 CLINICAL DATA:  Fall.  Blunt abdominal trauma.  Low back pain. EXAM: CT ABDOMEN AND PELVIS WITHOUT CONTRAST TECHNIQUE: Multidetector CT imaging of the abdomen and pelvis was performed following the standard protocol without IV contrast. COMPARISON:  02/20/2018 CT abdomen/pelvis. FINDINGS: Lower chest: Nonspecific scarring versus atelectasis at the dependent lung bases. Mild cardiomegaly. Coronary atherosclerosis. Hepatobiliary: Normal liver size. No liver mass. Cholelithiasis. No gallbladder wall thickening or  pericholecystic fluid. No biliary ductal dilatation. Periampullary duodenal diverticulum. Pancreas: Normal, with no mass or duct dilation. Spleen: Normal size. No mass. Adrenals/Urinary Tract: Normal adrenals. Multiple nonobstructing lower right renal stones, largest 4 mm. No left renal stones. No hydronephrosis. Minimally complex 5.6 cm left renal cyst with thin internal septal calcification, stable since 02/20/2018 CT. Simple 1.2 cm posterior lower left renal cyst. No contour deforming right renal lesions. Normal caliber ureters, with no ureteral stones. Normal bladder. Stomach/Bowel: Normal non-distended stomach. Small periumbilical ventral abdominal hernia containing small bowel loops, unchanged. No small bowel dilatation, focal caliber transition, pneumatosis or wall thickening. Appendix not discretely visualized. No pericecal inflammatory changes. Stable postsurgical changes in distal colon with intact appearing distal colonic anastomosis. Marked sigmoid diverticulosis, with no large bowel wall thickening or significant acute pericolonic fat stranding. Vascular/Lymphatic: Atherosclerotic nonaneurysmal abdominal aorta. No pathologically enlarged lymph nodes in the abdomen or pelvis. Reproductive: Status post hysterectomy, with no abnormal findings at the vaginal cuff. No adnexal mass. Other: No pneumoperitoneum, ascites or focal fluid collection. Musculoskeletal: No aggressive appearing focal osseous lesions. No acute fracture. No pelvic diastasis. Marked lumbar spondylosis. IMPRESSION: 1. No acute traumatic injury in the abdomen or pelvis. 2. No acute abnormality. No evidence of bowel obstruction or  acute bowel inflammation. Stable periumbilical ventral abdominal hernia containing small bowel loops without acute complication. Stable postsurgical changes in the distal colon with marked sigmoid diverticulosis and no evidence of acute diverticulitis. 3. Cholelithiasis. No evidence of acute cholecystitis. No  biliary ductal dilatation. 4. Nonobstructing right nephrolithiasis. 5.  Aortic Atherosclerosis (ICD10-I70.0). Electronically Signed   By: Ilona Sorrel M.D.   On: 05/20/2018 12:21   Dg Chest 1 View  Result Date: 05/20/2018 CLINICAL DATA:  Post fall, history CHF, hypertension EXAM: CHEST  1 VIEW COMPARISON:  12/22/2016 FINDINGS: Enlargement of cardiac silhouette. Enlarged central pulmonary arteries seen on previous exam. However, marked enlargement of the LEFT hilum versus the prior study, could be related to progressive pulmonary arterial enlargement or hilar mass/adenopathy. Bibasilar atelectasis. Chronic bronchitic changes and accentuation of perihilar markings. No pleural effusion or pneumothorax. Known LEFT mid lung mass, shown to be a pleural-based soft tissue mass on a prior CT, 3.3 cm greatest diameter grossly unchanged. Osseous demineralization with chronic RIGHT rotator cuff tear. IMPRESSION: Enlargement of cardiac silhouette. Known pleural-based LEFT lung mass, grossly stable. Bronchitic changes with bibasilar atelectasis. Significant enlargement of the pulmonary hila, increased especially on LEFT since previous exam, could be due to progressive pulmonary arterial hypertension although hilar mass/adenopathy not excluded; if further workup is clinically indicated, this could be best assessed by CT chest with IV contrast. Electronically Signed   By: Lavonia Dana M.D.   On: 05/20/2018 12:26    EKG:   Left bundle branch block.  IMPRESSION AND PLAN:   1.  Acute hypoxic respiratory failure with pulse ox of 86% on room air.  Start nebulizer treatments and incentive spirometry.  Lasix to get rid of fluid. 2.  Acute on chronic systolic congestive heart failure.  Obtain an echocardiogram.  IV Lasix 20 mg IV twice daily.  Patient already on Toprol and Spironolactone.  Add irbesartan. 3.  Neuropathy on gabapentin 4.  GERD on Protonix 5.  Weakness.  Obtain physical therapy evaluation. 6.  Impaired  fasting glucose check a hemoglobin A1c 7.  Chronic left mid lung mass seen on previous CT scans has been stable.   All the records are reviewed and case discussed with ED provider. Management plans discussed with the patient, family and they are in agreement.  CODE STATUS: DNR  TOTAL TIME TAKING CARE OF THIS PATIENT: 50 minutes, including ACP note.    Loletha Grayer M.D on 05/20/2018 at 2:22 PM  Between 7am to 6pm - Pager - [phone]  After 6pm call admission pager [phone]  [facility]  [phone]  CC: Primary care physician; Idelle Crouch, MD

## 2018-05-21 ENCOUNTER — Inpatient Hospital Stay: Payer: Medicare Other

## 2018-05-21 DIAGNOSIS — K311 Adult hypertrophic pyloric stenosis: Secondary | ICD-10-CM

## 2018-05-21 DIAGNOSIS — R112 Nausea with vomiting, unspecified: Secondary | ICD-10-CM

## 2018-05-21 LAB — CBC
HEMATOCRIT: 45.6 % (ref 35.0–47.0)
HEMOGLOBIN: 15.1 g/dL (ref 12.0–16.0)
MCH: 26.7 pg (ref 26.0–34.0)
MCHC: 33.2 g/dL (ref 32.0–36.0)
MCV: 80.5 fL (ref 80.0–100.0)
Platelets: 117 10*3/uL — ABNORMAL LOW (ref 150–440)
RBC: 5.66 MIL/uL — ABNORMAL HIGH (ref 3.80–5.20)
RDW: 14.9 % — AB (ref 11.5–14.5)
WBC: 8.7 10*3/uL (ref 3.6–11.0)

## 2018-05-21 LAB — BASIC METABOLIC PANEL
ANION GAP: 13 (ref 5–15)
BUN: 14 mg/dL (ref 8–23)
CO2: 24 mmol/L (ref 22–32)
Calcium: 9.4 mg/dL (ref 8.9–10.3)
Chloride: 103 mmol/L (ref 98–111)
Creatinine, Ser: 0.64 mg/dL (ref 0.44–1.00)
GFR calc Af Amer: >60 mL/min (ref >60)
GLUCOSE: 227 mg/dL — AB (ref 70–99)
POTASSIUM: 3.8 mmol/L (ref 3.5–5.1)
Sodium: 140 mmol/L (ref 135–145)

## 2018-05-21 LAB — ECHOCARDIOGRAM COMPLETE
Height: 65 in
Weight: 2272 oz

## 2018-05-21 LAB — GLUCOSE, CAPILLARY
GLUCOSE-CAPILLARY: 249 mg/dL — AB (ref 70–99)
Glucose-Capillary: 180 mg/dL — ABNORMAL HIGH (ref 70–99)
Glucose-Capillary: 136 mg/dL — ABNORMAL HIGH (ref 70–99)

## 2018-05-21 LAB — HEMOGLOBIN A1C
HEMOGLOBIN A1C: 7.2 % — AB (ref 4.8–5.6)
Mean Plasma Glucose: 159.94 mg/dL

## 2018-05-21 LAB — TROPONIN I
TROPONIN I: 0.12 ng/mL — AB (ref <0.03)
Troponin I: 0.07 ng/mL (ref <0.03)

## 2018-05-21 MED ORDER — METOPROLOL TARTRATE 5 MG/5ML IV SOLN
5.0000 mg | Freq: Four times a day (QID) | INTRAVENOUS | Status: DC
  Administered 2018-05-21 – 2018-05-22 (×4): 5 mg via INTRAVENOUS
  Filled 2018-05-21 (×4): qty 5

## 2018-05-21 MED ORDER — PANTOPRAZOLE SODIUM 40 MG IV SOLR
40.0000 mg | Freq: Two times a day (BID) | INTRAVENOUS | Status: DC
  Administered 2018-05-22 – 2018-05-24 (×5): 40 mg via INTRAVENOUS
  Filled 2018-05-21 (×5): qty 40

## 2018-05-21 MED ORDER — PROMETHAZINE HCL 25 MG/ML IJ SOLN
12.5000 mg | Freq: Four times a day (QID) | INTRAMUSCULAR | Status: DC | PRN
  Administered 2018-05-21: 12.5 mg via INTRAVENOUS
  Filled 2018-05-21: qty 1

## 2018-05-21 MED ORDER — HYDRALAZINE HCL 20 MG/ML IJ SOLN
5.0000 mg | INTRAMUSCULAR | Status: DC | PRN
  Administered 2018-05-21: 5 mg via INTRAVENOUS
  Filled 2018-05-21: qty 1

## 2018-05-21 MED ORDER — LORAZEPAM 2 MG/ML IJ SOLN
1.0000 mg | Freq: Once | INTRAMUSCULAR | Status: AC
  Administered 2018-05-21: 1 mg via INTRAVENOUS
  Filled 2018-05-21: qty 1

## 2018-05-21 MED ORDER — SODIUM CHLORIDE 0.9 % IV SOLN
INTRAVENOUS | Status: DC
  Administered 2018-05-22: 04:00:00 via INTRAVENOUS

## 2018-05-21 MED ORDER — METOPROLOL TARTRATE 5 MG/5ML IV SOLN: 5.0000 mg | Freq: Three times a day (TID) | INTRAVENOUS | Status: DC

## 2018-05-21 MED ORDER — INSULIN ASPART 100 UNIT/ML ~~LOC~~ SOLN: 0.0000 [IU] | Freq: Every day | SUBCUTANEOUS | Status: DC

## 2018-05-21 MED ORDER — SODIUM CHLORIDE 0.9% FLUSH
3.0000 mL | Freq: Two times a day (BID) | INTRAVENOUS | Status: DC
  Administered 2018-05-21 – 2018-05-26 (×10): 3 mL via INTRAVENOUS

## 2018-05-21 MED ORDER — TRAZODONE HCL 50 MG PO TABS
25.0000 mg | ORAL_TABLET | Freq: Once | ORAL | Status: AC
  Administered 2018-05-21: 25 mg via ORAL
  Filled 2018-05-21: qty 1

## 2018-05-21 MED ORDER — PROCHLORPERAZINE EDISYLATE 10 MG/2ML IJ SOLN
5.0000 mg | INTRAMUSCULAR | Status: DC | PRN
  Administered 2018-05-21: 5 mg via INTRAVENOUS
  Filled 2018-05-21 (×2): qty 1

## 2018-05-21 MED ORDER — INSULIN ASPART 100 UNIT/ML ~~LOC~~ SOLN
0.0000 [IU] | Freq: Three times a day (TID) | SUBCUTANEOUS | Status: DC
  Administered 2018-05-21 (×2): 3 [IU] via SUBCUTANEOUS
  Administered 2018-05-22: 1 [IU] via SUBCUTANEOUS
  Administered 2018-05-22 – 2018-05-25 (×5): 2 [IU] via SUBCUTANEOUS
  Administered 2018-05-25: 1 [IU] via SUBCUTANEOUS
  Administered 2018-05-26 (×2): 2 [IU] via SUBCUTANEOUS
  Filled 2018-05-21 (×10): qty 1

## 2018-05-21 NOTE — Consult Note (Signed)
Vonda Antigua, MD [address] [place], [place], [place], [address] [address], [place], [place], [place], [address] Phone: [phone]  Fax: [phone]  Consultation  Referring Provider:     Dr. Brett Albino Primary Care Physician:  Idelle Crouch, MD Reason for Consultation:     Nausea, vomiting, gastric distention  Date of Admission:  05/20/2018 Date of Consultation:  05/21/2018         HPI:   Jessica Blackwell is a 84 y.o. female presented status post fall at home yesterday.  Fell on her arm/elbow.  Did not hit her head.  Was found to have pulse ox of 86% on room air was admitted.  GI is being consulted due to nausea vomiting that started yesterday night.  Last episode of emesis was this morning.  Has had 2-3 episodes of emesis.  No hematemesis.  No melena.  No blood in stool.  Reports having 2-3 loose stools a day that started last week.  No fever or chills.  Has not had a bowel movement in 2 days.  Is passing gas.  No weight loss.  Abdominal x-ray showed severe gaseous distention of the stomach.  CT done on admission yesterday due to fall, showed no acute traumatic injury in the abdomen or pelvis.  No evidence of bowel obstruction, hernia tolerates without acute complication was reported.   See probation for previous EGD and colonoscopy report EGD 2008 for follow-up of Barrett's reported regular Z line and biopsies were obtained.  Normal exam otherwise.  Pathology report not available. EGD 2006 showed prepyloric gastric ulcer and biopsy was done pathology report not available. Colonoscopy 2006 for hematochezia, showed blood clots in the colon, and diverticulosis, but no active bleeding.  Recommendation would consider left colon resection for recurrent bleeding.  Patient reports her surgical resection was at [facility] around this time, but I am unable to find the surgical report.  Patient also had a CT abdomen pelvis in June 2019 due to abdominal pain.  This reported distention of the  duodenum with wall thickening and surrounding inflammation suspicious for duodenitis or peptic ulcer disease.  Large periampullary duodenal diverticulum was also reported.  Patient reports taking Aleve on a regular basis prior to the CT and was asked to stop after this CT.  She saw Dr. Vira Agar in clinic after that CT scan:  "Jessica Hiss, MD - 03/05/2018 3:30 PM EDT Patient is a 84 year old WF with history of epigastric abd pain and a CT scan done on June 7 th showed duodenitis with mild wall thickening and surrounding inflammation. They were thought to be post surgical changes in proximal small bowel and mesentery. She had gall stones and kidney stones also and some atrophied of the pancreas.  She has a hx of previous colon surgery for GI bleeding.  In our interview she reported that she takes 4 Aleve a day. I told her this was the likely source of her epigastric pain. She was started on Protonix 40mg  bid and this seems to have made a significant improvement and her stomach pain is now gone.  She lives by her self and uses a walker.  PE P 81 T 97.8 wt 147  Elderly WF in NAD, good conversation, chest clear, heart 1/6 SEM, abdomen soft, non tender, no masses palpable.  A: Likely her problems came from use of Aleve and caused the duodenitis. She got better with the Protonix so I want her to continue it forever.  Electronically signed by Jessica Hiss, MD at 03/06/2018 3:29 PM EDT"     Past Medical History:  Diagnosis Date  . Arthritis   . CHF (congestive heart failure) ([place])   . Fall at home 07/28/2015  . GERD (gastroesophageal reflux disease)   . H/O bladder infections   . Hypertension   . Neuropathy    lower extrmities    Past Surgical History:  Procedure Laterality Date  . ABDOMINAL HYSTERECTOMY    . CATARACT EXTRACTION    . COLON SURGERY    . FOOT SURGERY Right   . ROTATOR CUFF REPAIR Left   . TONSILLECTOMY      Prior to Admission medications     Medication Sig Start Date End Date Taking? Authorizing Provider  aspirin EC 81 MG tablet Take 81 mg by mouth daily.   Yes [provider]  furosemide (LASIX) 20 MG tablet Take 2 tablets (40 mg total) by mouth 2 (two) times daily. 12/23/16  Yes Mody, Ulice Bold, MD  gabapentin (NEURONTIN) 300 MG capsule Take 300 mg by mouth 3 (three) times daily.   Yes [provider]  metoprolol succinate (TOPROL-XL) 50 MG 24 hr tablet Take 1 tablet (50 mg total) by mouth daily. 08/02/15  Yes Debbe Odea, MD  pantoprazole (PROTONIX) 40 MG tablet Take 40 mg by mouth daily.   Yes [provider]  spironolactone (ALDACTONE) 25 MG tablet Take 12.5 mg by mouth daily.   Yes [provider]    Family History  Problem Relation Age of Onset  . Hypertension Father   . CAD Mother      Social History   Tobacco Use  . Smoking status: Never Smoker  . Smokeless tobacco: Never Used  Substance Use Topics  . Alcohol use: No  . Drug use: No    Allergies as of 05/20/2018 - Review Complete 05/20/2018  Allergen Reaction Noted  . Codeine Other (See Comments) 02/08/2014  . Ciprofloxacin Itching and Rash 01/16/2016  . Sulfamethoxazole-trimethoprim Rash 03/11/2017    Review of Systems:    All systems reviewed and negative except where noted in HPI.   Physical Exam:  Vital signs in last 24 hours: Vitals:   05/20/18 2202 05/21/18 0450 05/21/18 0938 05/21/18 1223  BP: (!) 146/94 (!) 184/98 (!) 180/102 (!) 152/77  Pulse: (!) 106 (!) 104 (!) 117 96  Resp:   (!) 24   Temp:  98 F (36.7 C)    TempSrc:  Oral    SpO2: 96% 94% 96%   Weight:  62.9 kg    Height:       Last BM Date: 05/18/18 General:   Pleasant, cooperative in NAD Head:  Normocephalic and atraumatic. Eyes:   No icterus.   Conjunctiva pink. PERRLA. Ears:  Normal auditory acuity. Neck:  Supple; no masses or thyroidomegaly Lungs: Respirations even and unlabored. Lungs clear to auscultation bilaterally.   No wheezes,  crackles, or rhonchi.  Abdomen:  Soft, nondistended, nontender. Normal bowel sounds. No appreciable masses or hepatomegaly.  No rebound or guarding.  Neurologic:  Alert and oriented x3;  grossly normal neurologically. Skin:  Intact without significant lesions or rashes. Cervical Nodes:  No significant cervical adenopathy. Psych:  Alert and cooperative. Normal affect.  LAB RESULTS: Recent Labs    05/20/18 1130 05/21/18 0352  WBC 6.2 8.7  HGB 14.0 15.1  HCT 43.6 45.6  PLT 114* 117*   BMET Recent Labs    05/20/18 1130 05/21/18 0352  NA 140 140  K 3.9 3.8  CL 105 103  CO2 26 24  GLUCOSE 164* 227*  BUN 9 14  CREATININE 0.60 0.64  CALCIUM 9.2 9.4   LFT No results for input(s): PROT, ALBUMIN, AST, ALT, ALKPHOS, BILITOT, BILIDIR, IBILI in the last 72 hours. PT/INR No results for input(s): LABPROT, INR in the last 72 hours.  STUDIES: Ct Abdomen Pelvis Wo Contrast  Result Date: 05/20/2018 CLINICAL DATA:  Fall.  Blunt abdominal trauma.  Low back pain. EXAM: CT ABDOMEN AND PELVIS WITHOUT CONTRAST TECHNIQUE: Multidetector CT imaging of the abdomen and pelvis was performed following the standard protocol without IV contrast. COMPARISON:  02/20/2018 CT abdomen/pelvis. FINDINGS: Lower chest: Nonspecific scarring versus atelectasis at the dependent lung bases. Mild cardiomegaly. Coronary atherosclerosis. Hepatobiliary: Normal liver size. No liver mass. Cholelithiasis. No gallbladder wall thickening or pericholecystic fluid. No biliary ductal dilatation. Periampullary duodenal diverticulum. Pancreas: Normal, with no mass or duct dilation. Spleen: Normal size. No mass. Adrenals/Urinary Tract: Normal adrenals. Multiple nonobstructing lower right renal stones, largest 4 mm. No left renal stones. No hydronephrosis. Minimally complex 5.6 cm left renal cyst with thin internal septal calcification, stable since 02/20/2018 CT. Simple 1.2 cm posterior lower left renal cyst. No contour deforming right  renal lesions. Normal caliber ureters, with no ureteral stones. Normal bladder. Stomach/Bowel: Normal non-distended stomach. Small periumbilical ventral abdominal hernia containing small bowel loops, unchanged. No small bowel dilatation, focal caliber transition, pneumatosis or wall thickening. Appendix not discretely visualized. No pericecal inflammatory changes. Stable postsurgical changes in distal colon with intact appearing distal colonic anastomosis. Marked sigmoid diverticulosis, with no large bowel wall thickening or significant acute pericolonic fat stranding. Vascular/Lymphatic: Atherosclerotic nonaneurysmal abdominal aorta. No pathologically enlarged lymph nodes in the abdomen or pelvis. Reproductive: Status post hysterectomy, with no abnormal findings at the vaginal cuff. No adnexal mass. Other: No pneumoperitoneum, ascites or focal fluid collection. Musculoskeletal: No aggressive appearing focal osseous lesions. No acute fracture. No pelvic diastasis. Marked lumbar spondylosis. IMPRESSION: 1. No acute traumatic injury in the abdomen or pelvis. 2. No acute abnormality. No evidence of bowel obstruction or acute bowel inflammation. Stable periumbilical ventral abdominal hernia containing small bowel loops without acute complication. Stable postsurgical changes in the distal colon with marked sigmoid diverticulosis and no evidence of acute diverticulitis. 3. Cholelithiasis. No evidence of acute cholecystitis. No biliary ductal dilatation. 4. Nonobstructing right nephrolithiasis. 5.  Aortic Atherosclerosis (ICD10-I70.0). Electronically Signed   By: Ilona Sorrel M.D.   On: 05/20/2018 12:21   Dg Chest 1 View  Result Date: 05/20/2018 CLINICAL DATA:  Post fall, history CHF, hypertension EXAM: CHEST  1 VIEW COMPARISON:  12/22/2016 FINDINGS: Enlargement of cardiac silhouette. Enlarged central pulmonary arteries seen on previous exam. However, marked enlargement of the LEFT hilum versus the prior study, could be  related to progressive pulmonary arterial enlargement or hilar mass/adenopathy. Bibasilar atelectasis. Chronic bronchitic changes and accentuation of perihilar markings. No pleural effusion or pneumothorax. Known LEFT mid lung mass, shown to be a pleural-based soft tissue mass on a prior CT, 3.3 cm greatest diameter grossly unchanged. Osseous demineralization with chronic RIGHT rotator cuff tear. IMPRESSION: Enlargement of cardiac silhouette. Known pleural-based LEFT lung mass, grossly stable. Bronchitic changes with bibasilar atelectasis. Significant enlargement of the pulmonary hila, increased especially on LEFT since previous exam, could be due to progressive pulmonary arterial hypertension although hilar mass/adenopathy not excluded; if further workup is clinically indicated, this could be best assessed by CT chest with IV contrast. Electronically Signed   By: Crist Infante.D.  On: 05/20/2018 12:26   Dg Abd Portable 1v  Result Date: 05/21/2018 CLINICAL DATA:  Vomiting since last night EXAM: PORTABLE ABDOMEN - 1 VIEW COMPARISON:  None. FINDINGS: There is severe gaseous distension of the stomach. There is no evidence of pneumoperitoneum, portal venous gas or pneumatosis. There are no pathologic calcifications along the expected course of the ureters. The osseous structures are unremarkable. IMPRESSION: Severe gaseous distension of the stomach which may reflect gastric outlet obstruction. Electronically Signed   By: Kathreen Devoid   On: 05/21/2018 11:17      Impression / Plan:   VIKA BUSKE is a 84 y.o. y/o female with nausea and vomiting, and x-ray gastric distention, with CT scan in 2019 reporting duodenal inflammation  EGD indicated to rule out gastric outlet obstruction CT findings in June 2019 could be due to peptic ulcer disease vs mass However, this was not reported on yesterday's CT, but yesterday CT was without contrast and thus is a limited exam  Agree with NG tube placement for gastric  decompression  EGD tomorrow pending cardiac clearance As per primary team, Dr. Brett Albino, she has contacted Dr. Saralyn Pilar, and he has cleared patient for the procedure.  I have discussed alternative options, risks & benefits,  which include, but are not limited to, bleeding, infection, perforation,respiratory complication & drug reaction.  The patient agrees with this plan & written consent will be obtained.    Will change PPI to IV twice daily in case there is an ulcer causing an obstruction  Thank you for involving me in the care of this patient.      LOS: 1 day   Virgel Manifold, MD  05/21/2018, 3:58 PM

## 2018-05-21 NOTE — Progress Notes (Signed)
Pt refused 0200 hr tx, pt stated she was feeling sick to her stomach, stated her nurse was aware and checking on nausea meds

## 2018-05-21 NOTE — Progress Notes (Signed)
Notified MD of pt continuing to have emesis. New orders placed. Will continue to monitor.

## 2018-05-21 NOTE — Consult Note (Signed)
[facility] Cardiology  CARDIOLOGY CONSULT NOTE  Patient ID: Jessica Blackwell MRN: [id number] DOB/AGE: 01/13/1925 84 y.o.  Admit date: 05/20/2018 Referring Physician Mayo Primary Physician [place] Primary Cardiologist Jamie-Lee Galdamez Reason for Consultation congestive heart failure  HPI: 84 year old female referred for evaluation of congestive heart failure.  Patient was in her usual state of health until 05/20/2018 when she fell.  Patient was noted to be hypoxic by her family members and brought to [facility] emergency room.  The patient was found to have oxygen saturation 86% on room air, and was started on 4 L of oxygen by nasal cannula.  Patient was admitted to telemetry.  Troponin was 0.04, 0.07 in the absence of chest pain.  On admission, patient has developed nausea and vomiting, found to have gastric distention on x-ray.  CT scan revealed distention of duodenum with inflammation suspicious for duodenitis or peptic ulcer disease.  She is scheduled for EGD and a.m.  Previous 2D echocardiogram 10/23/2016 revealed LVEF of 25 to 30% with mild aortic stenosis.  The echocardiogram earlier today revealed moderately to severely reduced left ventricular function, with moderate to severe aortic stenosis.  Review of systems complete and found to be negative unless listed above     Past Medical History:  Diagnosis Date  . Arthritis   . CHF (congestive heart failure) ([place])   . Fall at home 07/28/2015  . GERD (gastroesophageal reflux disease)   . H/O bladder infections   . Hypertension   . Neuropathy    lower extrmities    Past Surgical History:  Procedure Laterality Date  . ABDOMINAL HYSTERECTOMY    . CATARACT EXTRACTION    . COLON SURGERY    . FOOT SURGERY Right   . ROTATOR CUFF REPAIR Left   . TONSILLECTOMY      Medications Prior to Admission  Medication Sig Dispense Refill Last Dose  . aspirin EC 81 MG tablet Take 81 mg by mouth daily.   05/20/2018 at Unknown time  . furosemide (LASIX) 20 MG tablet Take 2  tablets (40 mg total) by mouth 2 (two) times daily. 60 tablet 0 05/20/2018 at Unknown time  . gabapentin (NEURONTIN) 300 MG capsule Take 300 mg by mouth 3 (three) times daily.   05/20/2018 at Unknown time  . metoprolol succinate (TOPROL-XL) 50 MG 24 hr tablet Take 1 tablet (50 mg total) by mouth daily. 30 tablet 0 05/20/2018 at Unknown time  . pantoprazole (PROTONIX) 40 MG tablet Take 40 mg by mouth daily.   05/20/2018 at Unknown time  . spironolactone (ALDACTONE) 25 MG tablet Take 12.5 mg by mouth daily.   05/20/2018 at Unknown time   Social History   Socioeconomic History  . Marital status: Widowed    Spouse name: Not on file  . Number of children: Not on file  . Years of education: Not on file  . Highest education level: Not on file  Occupational History  . Not on file  Social Needs  . Financial resource strain: Not on file  . Food insecurity:    Worry: Not on file    Inability: Not on file  . Transportation needs:    Medical: Not on file    Non-medical: Not on file  Tobacco Use  . Smoking status: Never Smoker  . Smokeless tobacco: Never Used  Substance and Sexual Activity  . Alcohol use: No  . Drug use: No  . Sexual activity: Not on file  Lifestyle  . Physical activity:    Days per  week: Not on file    Minutes per session: Not on file  . Stress: Not on file  Relationships  . Social connections:    Talks on phone: Not on file    Gets together: Not on file    Attends religious service: Not on file    Active member of club or organization: Not on file    Attends meetings of clubs or organizations: Not on file    Relationship status: Not on file  . Intimate partner violence:    Fear of current or ex partner: Not on file    Emotionally abused: Not on file    Physically abused: Not on file    Forced sexual activity: Not on file  Other Topics Concern  . Not on file  Social History Narrative  . Not on file    Family History  Problem Relation Age of Onset  . Hypertension  Father   . CAD Mother       Review of systems complete and found to be negative unless listed above      PHYSICAL EXAM  General: Well developed, well nourished, in no acute distress HEENT:  Normocephalic and atramatic Neck:  No JVD.  Lungs: Clear bilaterally to auscultation and percussion. Heart: HRRR . Normal S1 and S2 without gallops or murmurs.  Abdomen: Bowel sounds are positive, abdomen soft and non-tender  Msk:  Back normal, normal gait. Normal strength and tone for age. Extremities: No clubbing, cyanosis or edema.   Neuro: Alert and oriented X 3. Psych:  Good affect, responds appropriately  Labs:   Lab Results  Component Value Date   WBC 8.7 05/21/2018   HGB 15.1 05/21/2018   HCT 45.6 05/21/2018   MCV 80.5 05/21/2018   PLT 117 (L) 05/21/2018    Recent Labs  Lab 05/21/18 0352  NA 140  K 3.8  CL 103  CO2 24  BUN 14  CREATININE 0.64  CALCIUM 9.4  GLUCOSE 227*   Lab Results  Component Value Date   CKTOTAL 309 (H) 06/03/2012   CKMB 6.7 (H) 06/03/2012   TROPONINI 0.07 (HH) 05/21/2018    Lab Results  Component Value Date   CHOL 162 06/04/2012   Lab Results  Component Value Date   HDL 29 (L) 06/04/2012   Lab Results  Component Value Date   LDLCALC 103 (H) 06/04/2012   Lab Results  Component Value Date   TRIG 151 06/04/2012   No results found for: CHOLHDL No results found for: LDLDIRECT    Radiology: Ct Abdomen Pelvis Wo Contrast  Result Date: 05/20/2018 CLINICAL DATA:  Fall.  Blunt abdominal trauma.  Low back pain. EXAM: CT ABDOMEN AND PELVIS WITHOUT CONTRAST TECHNIQUE: Multidetector CT imaging of the abdomen and pelvis was performed following the standard protocol without IV contrast. COMPARISON:  02/20/2018 CT abdomen/pelvis. FINDINGS: Lower chest: Nonspecific scarring versus atelectasis at the dependent lung bases. Mild cardiomegaly. Coronary atherosclerosis. Hepatobiliary: Normal liver size. No liver mass. Cholelithiasis. No gallbladder wall  thickening or pericholecystic fluid. No biliary ductal dilatation. Periampullary duodenal diverticulum. Pancreas: Normal, with no mass or duct dilation. Spleen: Normal size. No mass. Adrenals/Urinary Tract: Normal adrenals. Multiple nonobstructing lower right renal stones, largest 4 mm. No left renal stones. No hydronephrosis. Minimally complex 5.6 cm left renal cyst with thin internal septal calcification, stable since 02/20/2018 CT. Simple 1.2 cm posterior lower left renal cyst. No contour deforming right renal lesions. Normal caliber ureters, with no ureteral stones. Normal bladder. Stomach/Bowel: Normal non-distended  stomach. Small periumbilical ventral abdominal hernia containing small bowel loops, unchanged. No small bowel dilatation, focal caliber transition, pneumatosis or wall thickening. Appendix not discretely visualized. No pericecal inflammatory changes. Stable postsurgical changes in distal colon with intact appearing distal colonic anastomosis. Marked sigmoid diverticulosis, with no large bowel wall thickening or significant acute pericolonic fat stranding. Vascular/Lymphatic: Atherosclerotic nonaneurysmal abdominal aorta. No pathologically enlarged lymph nodes in the abdomen or pelvis. Reproductive: Status post hysterectomy, with no abnormal findings at the vaginal cuff. No adnexal mass. Other: No pneumoperitoneum, ascites or focal fluid collection. Musculoskeletal: No aggressive appearing focal osseous lesions. No acute fracture. No pelvic diastasis. Marked lumbar spondylosis. IMPRESSION: 1. No acute traumatic injury in the abdomen or pelvis. 2. No acute abnormality. No evidence of bowel obstruction or acute bowel inflammation. Stable periumbilical ventral abdominal hernia containing small bowel loops without acute complication. Stable postsurgical changes in the distal colon with marked sigmoid diverticulosis and no evidence of acute diverticulitis. 3. Cholelithiasis. No evidence of acute  cholecystitis. No biliary ductal dilatation. 4. Nonobstructing right nephrolithiasis. 5.  Aortic Atherosclerosis (ICD10-I70.0). Electronically Signed   By: Ilona Sorrel M.D.   On: 05/20/2018 12:21   Dg Chest 1 View  Result Date: 05/20/2018 CLINICAL DATA:  Post fall, history CHF, hypertension EXAM: CHEST  1 VIEW COMPARISON:  12/22/2016 FINDINGS: Enlargement of cardiac silhouette. Enlarged central pulmonary arteries seen on previous exam. However, marked enlargement of the LEFT hilum versus the prior study, could be related to progressive pulmonary arterial enlargement or hilar mass/adenopathy. Bibasilar atelectasis. Chronic bronchitic changes and accentuation of perihilar markings. No pleural effusion or pneumothorax. Known LEFT mid lung mass, shown to be a pleural-based soft tissue mass on a prior CT, 3.3 cm greatest diameter grossly unchanged. Osseous demineralization with chronic RIGHT rotator cuff tear. IMPRESSION: Enlargement of cardiac silhouette. Known pleural-based LEFT lung mass, grossly stable. Bronchitic changes with bibasilar atelectasis. Significant enlargement of the pulmonary hila, increased especially on LEFT since previous exam, could be due to progressive pulmonary arterial hypertension although hilar mass/adenopathy not excluded; if further workup is clinically indicated, this could be best assessed by CT chest with IV contrast. Electronically Signed   By: Lavonia Dana M.D.   On: 05/20/2018 12:26   Dg Abd Portable 1v  Result Date: 05/21/2018 CLINICAL DATA:  Vomiting since last night EXAM: PORTABLE ABDOMEN - 1 VIEW COMPARISON:  None. FINDINGS: There is severe gaseous distension of the stomach. There is no evidence of pneumoperitoneum, portal venous gas or pneumatosis. There are no pathologic calcifications along the expected course of the ureters. The osseous structures are unremarkable. IMPRESSION: Severe gaseous distension of the stomach which may reflect gastric outlet obstruction.  Electronically Signed   By: Kathreen Devoid   On: 05/21/2018 11:17    EKG: Sinus rhythm with left bundle branch block  ASSESSMENT AND PLAN:   1.  Acute on chronic systolic congestive heart failure 2.  Moderate to severe aortic stenosis, not a candidate for TAVR or AVR 3.  Gastric outlet obstruction 4.  Elevated troponin, likely demand supply ischemia  Recommendations  1.  Agree with overall current therapy 2.  Continue diuresis 3.  Carefully monitor renal status 4.  Proceed with endoscopy as planned in a.m.  Signed: Isaias Cowman MD,PhD, [facility] 05/21/2018, 4:47 PM

## 2018-05-21 NOTE — Progress Notes (Addendum)
[place] [facility] [place] NAME: Jessica Blackwell    MR#:  [id number]  DATE OF BIRTH:  01/29/1925  SUBJECTIVE:  Had a lot of nausea and vomiting overnight. Endorses lower abdominal pain. Last BM was two days ago. No flatulence. States her breathing is stable. No chest pain.  REVIEW OF SYSTEMS:  Review of Systems  Constitutional: Negative for chills and fever.  HENT: Negative for congestion and sore throat.   Eyes: Negative for blurred vision and double vision.  Respiratory: Negative for cough and shortness of breath.   Cardiovascular: Negative for chest pain, palpitations and leg swelling.  Gastrointestinal: Positive for abdominal pain, nausea and vomiting. Negative for constipation and diarrhea.  Genitourinary: Negative for dysuria and frequency.  Musculoskeletal: Negative for back pain and neck pain.  Neurological: Negative for dizziness and headaches.  Psychiatric/Behavioral: Negative for depression. The patient is not nervous/anxious.     DRUG ALLERGIES:   Allergies  Allergen Reactions  . Codeine Other (See Comments)    Upset stomach  . Ciprofloxacin Itching and Rash  . Sulfamethoxazole-Trimethoprim Rash   VITALS:  Blood pressure (!) 152/77, pulse 96, temperature 98 F (36.7 C), temperature source Oral, resp. rate (!) 24, height 5\' 5"  (1.651 m), weight 62.9 kg, SpO2 96 %. PHYSICAL EXAMINATION:  Physical Exam  GENERAL:  84 y.o.-year-old patient lying in the bed with no acute distress.  EYES: Pupils equal, round, reactive to light and accommodation. No scleral icterus. Extraocular muscles intact.  HEENT: Head atraumatic, normocephalic. Oropharynx and nasopharynx clear.  NECK:  Supple, no jugular venous distention. No thyroid enlargement, no tenderness.  LUNGS: Normal breath sounds bilaterally, no wheezing, rales,rhonchi or crepitation. No use of accessory muscles of respiration. [place] in place. CARDIOVASCULAR: S1, S2 normal. 3/6 systolic  murmur, no rubs, or gallops.  ABDOMEN: Soft, +mild tenderness to palpation of the LLQ. Bowel sounds present. No organomegaly or mass.  EXTREMITIES: No pedal edema, cyanosis, or clubbing.  NEUROLOGIC: Cranial nerves II through XII are intact. Muscle strength 5/5 in all extremities. Sensation intact. Gait not checked.  PSYCHIATRIC: The patient is alert and oriented x 3.  SKIN: No rash, lesion, or ulcer.  LABORATORY PANEL:  Female CBC Recent Labs  Lab 05/21/18 0352  WBC 8.7  HGB 15.1  HCT 45.6  PLT 117*   ------------------------------------------------------------------------------------------------------------------ Chemistries  Recent Labs  Lab 05/21/18 0352  NA 140  K 3.8  CL 103  CO2 24  GLUCOSE 227*  BUN 14  CREATININE 0.64  CALCIUM 9.4   RADIOLOGY:  Dg Abd Portable 1v  Result Date: 05/21/2018 CLINICAL DATA:  Vomiting since last night EXAM: PORTABLE ABDOMEN - 1 VIEW COMPARISON:  None. FINDINGS: There is severe gaseous distension of the stomach. There is no evidence of pneumoperitoneum, portal venous gas or pneumatosis. There are no pathologic calcifications along the expected course of the ureters. The osseous structures are unremarkable. IMPRESSION: Severe gaseous distension of the stomach which may reflect gastric outlet obstruction. Electronically Signed   By: Kathreen Devoid   On: 05/21/2018 11:17   ASSESSMENT AND PLAN:   Acute hypoxic respiratory failure- due to acute on chronic systolic CHF. Last ECHO with EF 25-30%. Remains on 2L O2 this morning. - continue lasix 20mg  IV bid - ECHO pending - cardiology consult - continue metoprolol and spironolactone - irbesartan added this admission - duonebs prn and incentive spirometry - wean O2 as able  Nausea/vomiting/lower abdominal pain- had CT abdomen/pelvis in the ED, which was  unremarkable. Abdominal x-ray performed this morning showed severe gaseous distension of the stomach maybe due to gastric outlet obstruction. -  continue zofran prn - add phenergan prn - GI consult - place NG tube - NPO  Elevated troponin- troponins 0.04 and 0.07. Likely demand ischemia in the setting of CHF exacerbation. No active chest pain. - trend  Neuropathy- stable - continue gabapentin  GERD- stable - continue protonix  Generalized weakness/deconditioning- likely due to age - PT consult  Hyperglycemia- CBG 249 this morning - check A1c - add sensitive SSI  Chronic left mid lung mass- stable, seen on previous CT - monitor as outpatient  All the records are reviewed and case discussed with Care Management/Social Worker. Management plans discussed with the patient, family and they are in agreement.  CODE STATUS: DNR  TOTAL TIME TAKING CARE OF THIS PATIENT: 45 minutes.   More than 50% of the time was spent in counseling/coordination of care: YES  POSSIBLE D/C IN 2-3 DAYS, DEPENDING ON CLINICAL CONDITION.   Berna Spare Mayo M.D on 05/21/2018 at 1:54 PM  Between 7am to 6pm - Pager - [phone]  After 6pm go to [email or web address] - Proofreader  [facility]  Office  [phone]  CC: Primary care physician; Idelle Crouch, MD  Note: This dictation was prepared with Dragon dictation along with smaller phrase technology. Any transcriptional errors that result from this process are unintentional.

## 2018-05-21 NOTE — Plan of Care (Signed)
  Problem: Clinical Measurements: Goal: Respiratory complications will improve Outcome: Not Progressing  Pt placed back on O2@ 2 liters. Pt refused 0200 duoneb treatment

## 2018-05-21 NOTE — Progress Notes (Signed)
Patient ID: Jessica Blackwell, female   DOB: March 04, 1925, 84 y.o.   MRN: [id number] Pt's daughter Otho Perl is saying that her mother wants to be full code Will change code stauts to FULL code

## 2018-05-21 NOTE — Progress Notes (Signed)
PT Cancellation Note  Patient Details Name: Jessica Blackwell MRN: [id number] DOB: [birth year]   Cancelled Treatment:    Reason Eval/Treat Not Completed: Medical issues which prohibited therapy(Consult received and chart reviewed.  Patient refused participation with session this date due to persistent nausea (meds previously adminstered) despite encouragement.  Will continue efforts next date as medically appropriate and available.)   Antavia Tandy H. Owens Shark, PT, DPT, NCS 05/21/18, 1:54 PM [phone]

## 2018-05-21 NOTE — Progress Notes (Signed)
Pt requesting something to help rest. Orders were placed. Pt not able to keep medication down.

## 2018-05-21 NOTE — Progress Notes (Addendum)
Pt daughter states that pt wants to be resuscitated. Page prime. Doctor Posey Pronto called and talked to Lu-ann (daughter). Lu-ann states she wants to be resuscitated and POA also know that pt wants to be resuscitated. Will continue to monitor.  Update 2006: Doctor Posey Pronto change the code from DNR to Full Code. Will continue to monitor.  Update 2150: Daughter Lu-ann wants to D/C breathing treatment and was communicated to [place]. Lu-ann states that breathing treatment cause pt to have nausea and abdominal pain last night. Doctor diamond made aware and states he will discontinue breathing treatment. Will continue to monitor.  Update 2305: Pt has NG tube but order didn't specify the suction rate. Notify prime. Will continue to monitor.  Update 2310: Doctor Marcille Blanco place an order for NG tube to be on low continuous suction. Will continue to monitor.  Update 0039: Pt BP 105/64 HR 94. Pt have a scheduled metropolol 5 mg IV. Page prime. Will continue to monitor.  Update 104; Doctor Marcille Blanco ordered states to hold metropolol 5 mg IV due to low BP. Will continue to monitor.  Update 0352: Pt only voided 1 small amt of urine at 2245. Pt was Npo and only getting ice chips. Pt bladder scan showed 198. MD made aware. Will continue to monitor.

## 2018-05-22 ENCOUNTER — Encounter
Admission: EM | Disposition: A | Discharge status: Skilled Nursing Facility | Payer: Self-pay | Source: Home / Self Care | Attending: Internal Medicine

## 2018-05-22 ENCOUNTER — Inpatient Hospital Stay: Payer: Medicare Other | Admitting: Anesthesiology

## 2018-05-22 ENCOUNTER — Encounter: Payer: Self-pay | Admitting: *Deleted

## 2018-05-22 DIAGNOSIS — R110 Nausea: Secondary | ICD-10-CM

## 2018-05-22 DIAGNOSIS — R112 Nausea with vomiting, unspecified: Secondary | ICD-10-CM

## 2018-05-22 HISTORY — PX: ESOPHAGOGASTRODUODENOSCOPY: SHX5428

## 2018-05-22 LAB — URINALYSIS, COMPLETE (UACMP) WITH MICROSCOPIC
Bilirubin Urine: NEGATIVE
GLUCOSE, UA: NEGATIVE mg/dL
Hgb urine dipstick: NEGATIVE
Ketones, ur: NEGATIVE mg/dL
Nitrite: NEGATIVE
PH: 5 (ref 5.0–8.0)
Protein, ur: 100 mg/dL — AB
SPECIFIC GRAVITY, URINE: 1.018 (ref 1.005–1.030)

## 2018-05-22 LAB — COMPREHENSIVE METABOLIC PANEL
ALT: 12 U/L (ref 0–44)
AST: 12 U/L — ABNORMAL LOW (ref 15–41)
Albumin: 4.5 g/dL (ref 3.5–5.0)
Alkaline Phosphatase: 50 U/L (ref 38–126)
Anion gap: 11 (ref 5–15)
BUN: 30 mg/dL — ABNORMAL HIGH (ref 8–23)
CO2: 29 mmol/L (ref 22–32)
Calcium: 9.5 mg/dL (ref 8.9–10.3)
Chloride: 102 mmol/L (ref 98–111)
Creatinine, Ser: 1.13 mg/dL — ABNORMAL HIGH (ref 0.44–1.00)
GFR calc non Af Amer: 41 mL/min — ABNORMAL LOW (ref >60)
GFR, EST AFRICAN AMERICAN: 48 mL/min — AB (ref >60)
Glucose, Bld: 164 mg/dL — ABNORMAL HIGH (ref 70–99)
Potassium: 3.5 mmol/L (ref 3.5–5.1)
SODIUM: 142 mmol/L (ref 135–145)
Total Bilirubin: 1.5 mg/dL — ABNORMAL HIGH (ref 0.3–1.2)
Total Protein: 7.4 g/dL (ref 6.5–8.1)

## 2018-05-22 LAB — CBC
HEMATOCRIT: 45.3 % (ref 35.0–47.0)
HEMOGLOBIN: 14.8 g/dL (ref 12.0–16.0)
MCH: 26.4 pg (ref 26.0–34.0)
MCHC: 32.7 g/dL (ref 32.0–36.0)
MCV: 80.6 fL (ref 80.0–100.0)
Platelets: 117 10*3/uL — ABNORMAL LOW (ref 150–440)
RBC: 5.62 MIL/uL — AB (ref 3.80–5.20)
RDW: 14.9 % — ABNORMAL HIGH (ref 11.5–14.5)
WBC: 11.2 10*3/uL — ABNORMAL HIGH (ref 3.6–11.0)

## 2018-05-22 LAB — GLUCOSE, CAPILLARY
GLUCOSE-CAPILLARY: 141 mg/dL — AB (ref 70–99)
GLUCOSE-CAPILLARY: 175 mg/dL — AB (ref 70–99)
GLUCOSE-CAPILLARY: 96 mg/dL (ref 70–99)
Glucose-Capillary: 119 mg/dL — ABNORMAL HIGH (ref 70–99)

## 2018-05-22 LAB — TROPONIN I: Troponin I: 0.08 ng/mL (ref <0.03)

## 2018-05-22 SURGERY — EGD (ESOPHAGOGASTRODUODENOSCOPY)
Anesthesia: General

## 2018-05-22 MED ORDER — PROPOFOL 500 MG/50ML IV EMUL
INTRAVENOUS | Status: DC | PRN
  Administered 2018-05-22: 100 ug/kg/min via INTRAVENOUS

## 2018-05-22 MED ORDER — PROPOFOL 10 MG/ML IV BOLUS
INTRAVENOUS | Status: DC | PRN
  Administered 2018-05-22: 60 mg via INTRAVENOUS

## 2018-05-22 NOTE — Anesthesia Procedure Notes (Signed)
Date/Time: 05/22/2018 11:05 AM Performed by: Doreen Salvage, CRNA Pre-anesthesia Checklist: Patient identified, Emergency Drugs available, Suction available and Patient being monitored Patient Re-evaluated:Patient Re-evaluated prior to induction Oxygen Delivery Method: Nasal cannula Induction Type: IV induction Dental Injury: Teeth and Oropharynx as per pre-operative assessment  Comments: Nasal cannula with etCO2 monitoring

## 2018-05-22 NOTE — Transfer of Care (Signed)
Immediate Anesthesia Transfer of Care Note  Patient: Jessica Blackwell  Procedure(s) Performed: Procedure(s): ESOPHAGOGASTRODUODENOSCOPY (EGD) (N/A)  Patient Location: PACU and Endoscopy Unit  Anesthesia Type:General  Level of Consciousness: sedated  Airway & Oxygen Therapy: Patient Spontanous Breathing and Patient connected to nasal cannula oxygen  Post-op Assessment: Report given to RN and Post -op Vital signs reviewed and stable  Post vital signs: Reviewed and stable  Last Vitals:  Vitals:   05/22/18 1027 05/22/18 1131  BP: (!) 143/69 123/66  Pulse: 90 92  Resp: 18 20  Temp: (!) 36.1 C (!) 36 C  SpO2: 16% 10%    Complications: No apparent anesthesia complications

## 2018-05-22 NOTE — Anesthesia Post-op Follow-up Note (Signed)
Anesthesia QCDR form completed.        

## 2018-05-22 NOTE — OR Nursing (Signed)
Dr Bonna Gains ordered ice chips as tolerated.  Pt spitting up mucous but tolerating the chips

## 2018-05-22 NOTE — Anesthesia Preprocedure Evaluation (Signed)
Anesthesia Evaluation  Patient identified by MRN, date of birth, ID band Patient awake    Reviewed: Allergy & Precautions, H&P , NPO status , Patient's Chart, lab work & pertinent test results, reviewed documented beta blocker date and time   Airway Mallampati: II   Neck ROM: full    Dental  (+) Poor Dentition   Pulmonary neg pulmonary ROS,    Pulmonary exam normal        Cardiovascular Exercise Tolerance: Good hypertension, On Medications +CHF  negative cardio ROS Normal cardiovascular exam Rhythm:regular Rate:Normal     Neuro/Psych negative neurological ROS  negative psych ROS   GI/Hepatic negative GI ROS, Neg liver ROS, GERD  Medicated,  Endo/Other  negative endocrine ROS  Renal/GU Renal diseasenegative Renal ROS  negative genitourinary   Musculoskeletal   Abdominal   Peds  Hematology negative hematology ROS (+) anemia ,   Anesthesia Other Findings Past Medical History: No date: Arthritis No date: CHF (congestive heart failure) ([place]) 07/28/2015: Fall at home No date: GERD (gastroesophageal reflux disease) No date: H/O bladder infections No date: Hypertension No date: Neuropathy     Comment:  lower extrmities Past Surgical History: No date: ABDOMINAL HYSTERECTOMY No date: CATARACT EXTRACTION No date: COLON SURGERY No date: EYE SURGERY No date: FOOT SURGERY; Right No date: ROTATOR CUFF REPAIR; Left No date: TONSILLECTOMY BMI    Body Mass Index:  22.32 kg/m     Reproductive/Obstetrics negative OB ROS                             Anesthesia Physical Anesthesia Plan  ASA: III  Anesthesia Plan: General   Post-op Pain Management:    Induction:   PONV Risk Score and Plan:   Airway Management Planned:   Additional Equipment:   Intra-op Plan:   Post-operative Plan:   Informed Consent: I have reviewed the patients History and Physical, chart, labs and discussed the  procedure including the risks, benefits and alternatives for the proposed anesthesia with the patient or authorized representative who has indicated his/her understanding and acceptance.   Dental Advisory Given  Plan Discussed with: CRNA  Anesthesia Plan Comments:         Anesthesia Quick Evaluation

## 2018-05-22 NOTE — Progress Notes (Signed)
[facility] [place] NAME: Jessica Blackwell    MR#:  [id number]  DATE OF BIRTH:  May 29, 1925  SUBJECTIVE:  Had her endoscopy this morning. Throat feels sore. Abdomen is feeling much better after NG tube placement. She denies any additional nausea/vomiting.  REVIEW OF SYSTEMS:  Review of Systems  Constitutional: Negative for chills and fever.  HENT: Positive for sore throat. Negative for congestion.   Eyes: Negative for blurred vision and double vision.  Respiratory: Negative for cough and shortness of breath.   Cardiovascular: Negative for chest pain, palpitations and leg swelling.  Gastrointestinal: Positive for abdominal pain, nausea and vomiting. Negative for constipation and diarrhea.  Genitourinary: Negative for dysuria and frequency.  Musculoskeletal: Negative for back pain and neck pain.  Neurological: Negative for dizziness and headaches.  Psychiatric/Behavioral: Negative for depression. The patient is not nervous/anxious.     DRUG ALLERGIES:   Allergies  Allergen Reactions  . Codeine Other (See Comments)    Upset stomach  . Ciprofloxacin Itching and Rash  . Sulfamethoxazole-Trimethoprim Rash   VITALS:  Blood pressure (!) 141/68, pulse 90, temperature (!) 96.8 F (36 C), temperature source Tympanic, resp. rate 18, height 5\' 5"  (1.651 m), weight 60.8 kg, SpO2 95 %. PHYSICAL EXAMINATION:  Physical Exam  GENERAL:  84 y.o.-year-old patient lying in the bed with no acute distress.  EYES: Pupils equal, round, reactive to light and accommodation. No scleral icterus. Extraocular muscles intact.  HEENT: Head atraumatic, normocephalic. Oropharynx and nasopharynx clear. NG tube in place. NECK:  Supple, no jugular venous distention. No thyroid enlargement, no tenderness.  LUNGS: Normal breath sounds bilaterally, no wheezing, rales,rhonchi or crepitation. No use of accessory muscles of respiration. [place] in place. CARDIOVASCULAR: S1, S2  normal. 3/6 systolic murmur, no rubs, or gallops.  ABDOMEN: Soft, non-tender, non-distended. Bowel sounds present. No organomegaly or mass.  EXTREMITIES: No pedal edema, cyanosis, or clubbing.  NEUROLOGIC: Cranial nerves II through XII are intact. Muscle strength 5/5 in all extremities. Sensation intact. Gait not checked.  PSYCHIATRIC: The patient is alert and oriented x 3.  SKIN: No rash, lesion, or ulcer.  LABORATORY PANEL:  Female CBC Recent Labs  Lab 05/22/18 0324  WBC 11.2*  HGB 14.8  HCT 45.3  PLT 117*   ------------------------------------------------------------------------------------------------------------------ Chemistries  Recent Labs  Lab 05/22/18 0324  NA 142  K 3.5  CL 102  CO2 29  GLUCOSE 164*  BUN 30*  CREATININE 1.13*  CALCIUM 9.5  AST 12*  ALT 12  ALKPHOS 50  BILITOT 1.5*   RADIOLOGY:  Dg Chest Port 1 View  Result Date: 05/21/2018 CLINICAL DATA:  84 year old female status post nasogastric tube placement EXAM: PORTABLE CHEST 1 VIEW COMPARISON:  Prior chest x-ray obtained yesterday; prior CT scan of the chest 01/15/2013 FINDINGS: A nasogastric tube is now present. The tip lies off the field of view, below the diaphragm and presumably within the stomach. Stable cardiac and mediastinal contours including marked enlargement of the main pulmonary. A probable pleural base mass in the left mid lung remains unchanged at approximately 3 cm. Background chronic bronchitic changes. No pneumothorax. Small bilateral layering effusions, unchanged. No acute osseous abnormality. IMPRESSION: 1. The tip of the nasogastric tube lies below the field of view, below the diaphragm and likely within the stomach. 2. Stable left peripheral mid lung mass dating back to at least 01/15/2013. 3. Markedly enlarged main pulmonary artery as can be seen in the setting of pulmonary arterial hypertension.  Electronically Signed   By: Jacqulynn Cadet M.D.   On: 05/21/2018 17:05   ASSESSMENT AND  PLAN:   Acute hypoxic respiratory failure- due to acute on chronic systolic CHF. Last ECHO with EF 25-30%. On 2L O2 this morning. - hold IV lasix for today due to worsening renal function - ECHO with moderate LVH and moderate to severe stenosis, no comment on EF. - cardiology following - continue metoprolol and spironolactone - irbesartan added this admission, but will hold for today - duonebs prn and incentive spirometry - wean O2 as able  Gastric outlet obstruction- seen on abdominal x-ray on 9/5. Nausea and vomiting has resolved with NG tube (placed 9/5). - CT abdomen/pelvis unremarkable - underwent EGD today, which was negative - GI following- recommend removing NG tube today and starting clear liquid diet - continue zofran and phenergan prn  Acute kidney injury- Cr increased from 0.64 > 1.13 today. Likely due to IV lasix, new ACE inhibitor, and multiple episodes of emesis yesterday. - hold IV lasix for today - hold irbesartan - encouraged po intake  Elevated troponin- troponins 0.04, 0.07, 0.12. Likely demand ischemia in the setting of CHF exacerbation. No active chest pain. - will check another troponin  Neuropathy- stable - continue gabapentin  GERD- stable - continue protonix  Generalized weakness/deconditioning- likely due to age - PT consult  New diagnosis of T2DM- A1c 7.2% this admission - sensitive SSI - needs to f/u with PCP  Chronic left mid lung mass- stable, seen on previous CT - monitor as outpatient  All the records are reviewed and case discussed with Care Management/Social Worker. Management plans discussed with the patient, family and they are in agreement.  CODE STATUS: Full Code  TOTAL TIME TAKING CARE OF THIS PATIENT: 45 minutes.   More than 50% of the time was spent in counseling/coordination of care: YES  POSSIBLE D/C IN 2-3 DAYS, DEPENDING ON CLINICAL CONDITION.   Berna Spare Mayo M.D on 05/22/2018 at 2:55 PM  Between 7am to 6pm - Pager -  [phone]  After 6pm go to [email or web address] - Proofreader  [facility]  Office  [phone]  CC: Primary care physician; Idelle Crouch, MD  Note: This dictation was prepared with Dragon dictation along with smaller phrase technology. Any transcriptional errors that result from this process are unintentional.

## 2018-05-22 NOTE — Evaluation (Signed)
Physical Therapy Evaluation Patient Details Name: Jessica Blackwell MRN: [id number] DOB: [birth year] Today's Date: 05/22/2018   History of Present Illness  Pt is a 84 y.o. female with a known history of congestive heart failure and hypertension neuropathy presents with a fall at home.  She was getting ready to eat breakfast she walked with her walker and tried to sit down and before she knew it she ended up on the ground.  She came to the ER to get checked out.  She has a left arm skin tear.  She was found to have a pulse ox of 86% on room air and was placed on oxygen.  Hospitalist services were contacted for further evaluation.  Assessment includes: Acute hypoxic respiratory failure due to acute on chronic systolic CHF, gastric outlet obstruction, acute kidney injury, elevated troponin likely demand ischemia, weakness, DM, L lung mass, and neuropathy.     Clinical Impression  Upon entering room pt found to be on room air with SpO2 84%.  Nursing contacted, entered the room, and increased pt's SpO2 to 3LO2/min with SpO2 increasing to 94% after around 30 sec. Pt presents with deficits in strength, transfers, mobility, gait, balance, and activity tolerance.  Pt required extra time and effort with bed mobility tasks and min A with transfers with min instability once in standing.  Pt fatigued quickly in standing and was only able to amb around 2-3 feet before requiring to return to sitting.  Pt's SpO2 remained 94-97% with exertion on 3LO2/min with HR WNL.  Pt presents with significant decline in function compared to her stated baseline and would not be safe to return to her prior living situation at this time.  Pt will benefit from PT services in a SNF setting upon discharge to safely address above deficits for decreased caregiver assistance and eventual return to PLOF.  Pt will benefit from PT services in a SNF setting upon discharge to safely address above deficits for decreased caregiver assistance and eventual  return to PLOF.       Follow Up Recommendations SNF;Supervision for mobility/OOB    Equipment Recommendations  None recommended by PT    Recommendations for Other Services       Precautions / Restrictions Precautions Precautions: Fall Restrictions Weight Bearing Restrictions: No      Mobility  Bed Mobility Overal bed mobility: Modified Independent             General bed mobility comments: Extra time and effort required with bed mobility tasks but no physical assistance  Transfers Overall transfer level: Needs assistance Equipment used: Rolling walker (2 wheeled) Transfers: Sit to/from Stand Sit to Stand: Min assist;From elevated surface         General transfer comment: Mod verbal cues for sequencing  Ambulation/Gait Ambulation/Gait assistance: Min guard Gait Distance (Feet): 2 Feet Assistive device: Rolling walker (2 wheeled) Gait Pattern/deviations: Step-through pattern;Decreased step length - right;Decreased step length - left Gait velocity: Decreased   General Gait Details: Pt only able to take several steps towards recliner before fatiguing and requiring to sit; slow cadence with short, effortful steps  Stairs            Wheelchair Mobility    Modified Rankin (Stroke Patients Only)       Balance Overall balance assessment: Mild deficits observed, not formally tested  Pertinent Vitals/Pain Pain Assessment: No/denies pain    Home Living Family/patient expects to be discharged to:: Private residence Living Arrangements: Alone Available Help at Discharge: Family;Available PRN/intermittently Type of Home: House Home Access: Stairs to enter Entrance Stairs-Rails: Left Entrance Stairs-Number of Steps: 3 Home Layout: One level Home Equipment: Walker - 2 wheels;Walker - 4 wheels;Wheelchair - manual      Prior Function Level of Independence: Needs assistance   Gait /  Transfers Assistance Needed: Mod Ind with amb in the home only with a rollator, w/c in the community, 2 falls in the last 6 months, Ind with transfers and bed mobility, SBA ascending/descending stairs at home  ADL's / Homemaking Assistance Needed: Assistance from family with bathing and dressing with pt occasionally dressing on her own, meals on wheels and family assist for meals        Hand Dominance        Extremity/Trunk Assessment   Upper Extremity Assessment Upper Extremity Assessment: Generalized weakness    Lower Extremity Assessment Lower Extremity Assessment: Generalized weakness       Communication   Communication: HOH  Cognition Arousal/Alertness: Awake/alert Behavior During Therapy: WFL for tasks assessed/performed Overall Cognitive Status: Within Functional Limits for tasks assessed                                        General Comments      Exercises Total Joint Exercises Ankle Circles/Pumps: AROM;Both;10 reps Quad Sets: Strengthening;Both;10 reps Gluteal Sets: Strengthening;Both;10 reps Heel Slides: AROM;Both;5 reps Hip ABduction/ADduction: AROM;Both;5 reps Long Arc Quad: AROM;Both;10 reps Knee Flexion: AROM;Both;10 reps Marching in Standing: AROM;Both;5 reps;Seated   Assessment/Plan    PT Assessment Patient needs continued PT services  PT Problem List Decreased strength;Decreased activity tolerance;Decreased balance;Decreased mobility;Cardiopulmonary status limiting activity       PT Treatment Interventions DME instruction;Gait training;Stair training;Functional mobility training;Balance training;Therapeutic exercise;Therapeutic activities;Patient/family education    PT Goals (Current goals can be found in the Care Plan section)  Acute Rehab PT Goals Patient Stated Goal: To return home to prior living situation PT Goal Formulation: With patient Time For Goal Achievement: 06/04/18 Potential to Achieve Goals: Fair     Frequency Min 2X/week   Barriers to discharge Inaccessible home environment;Decreased caregiver support      Co-evaluation               AM-PAC PT "6 Clicks" Daily Activity  Outcome Measure Difficulty turning over in bed (including adjusting bedclothes, sheets and blankets)?: A Little Difficulty moving from lying on back to sitting on the side of the bed? : A Little Difficulty sitting down on and standing up from a chair with arms (e.g., wheelchair, bedside commode, etc,.)?: Unable Help needed moving to and from a bed to chair (including a wheelchair)?: A Little Help needed walking in hospital room?: A Lot Help needed climbing 3-5 steps with a railing? : Total 6 Click Score: 13    End of Session Equipment Utilized During Treatment: Gait belt;Oxygen Activity Tolerance: Patient limited by fatigue Patient left: in chair;with chair alarm set;with call bell/phone within reach;with family/visitor present Nurse Communication: Mobility status;Other (comment)(SpO2 84% on room air upon entering pt's room) PT Visit Diagnosis: Unsteadiness on feet (R26.81);Muscle weakness (generalized) (M62.81);Difficulty in walking, not elsewhere classified (R26.2)    Time: 2876-8115 PT Time Calculation (min) (ACUTE ONLY): 43 min   Charges:   PT Evaluation $PT Eval Low Complexity:  1 Low PT Treatments $Therapeutic Exercise: 8-22 mins        D. Scott Emsley Custer PT, DPT 05/22/18, 5:25 PM

## 2018-05-22 NOTE — Progress Notes (Signed)
PT Cancellation Note  Patient at procedure or test/unavailable.  Evaluation re-attempted.  Patient currently off unit for endoscopy.  Will continue efforts at later time/date as patient available and medically appropriate  ,Eugina Row H. Owens Shark, PT, DPT, NCS 05/22/18, 10:37 AM [phone]

## 2018-05-22 NOTE — Op Note (Signed)
[facility] Patient Name: Jessica Blackwell Procedure Date: 05/22/2018 11:02 AM MRN: [id number] Account #: [id number] Date of Birth: [birth year] Admit Type: Inpatient Age: 84 Room: [facility] ENDO ROOM 2 Gender: Female Note Status: Finalized Procedure:            Upper GI endoscopy Indications:          Nausea with vomiting Providers:            Jozee Hammer B. Bonna Gains MD, MD Referring MD:         Forest Gleason Md, MD (Referring MD) Medicines:            Monitored Anesthesia Care Complications:        No immediate complications. Procedure:            Pre-Anesthesia Assessment:                       - Prior to the procedure, a History and Physical was                        performed, and patient medications, allergies and                        sensitivities were reviewed. The patient's tolerance of                        previous anesthesia was reviewed.                       - The risks and benefits of the procedure and the                        sedation options and risks were discussed with the                        patient. All questions were answered and informed                        consent was obtained.                       - Patient identification and proposed procedure were                        verified prior to the procedure by the physician, the                        nurse, the anesthesiologist, the anesthetist and the                        technician. The procedure was verified in the procedure                        room.                       - ASA Grade Assessment: III - A patient with severe                        systemic disease.  After obtaining informed consent, the endoscope was                        passed under direct vision. Throughout the procedure,                        the patient's blood pressure, pulse, and oxygen                        saturations were monitored continuously. The Endoscope                         was introduced through the mouth, and advanced to the                        second part of duodenum. The upper GI endoscopy was                        accomplished with ease. The patient tolerated the                        procedure well. Findings:      The examined esophagus was normal.      The entire examined stomach was normal.      A small amount of food (residue) was found in the gastric fundus.      The duodenal bulb, second portion of the duodenum and examined duodenum       were normal.      No evidence of gastric outlet obstruction.      NG tube was seen in place in the stomach and was left in place. Impression:           - Normal esophagus.                       - Normal stomach.                       - A small amount of food (residue) in the stomach.                       - Normal duodenal bulb, second portion of the duodenum                        and examined duodenum.                       - No evidence of gastric outlet obstruction.                       - No specimens collected. Recommendation:       - Leave NG tube clamped. If no further Nausea/Vomiting                        start clear liquid diet.                       Can remove NG tube later today if no further symptoms                       Discontinue IV protonix                       -  Continue present medications.                       - Patient has a contact number available for                        emergencies. The signs and symptoms of potential                        delayed complications were discussed with the patient.                        Return to normal activities tomorrow. Written discharge                        instructions were provided to the patient.                       - The findings and recommendations were discussed with                        the patient.                       - The findings and recommendations were discussed with                        the patient's  family. Procedure Code(s):    --- Professional ---                       [phone], Esophagogastroduodenoscopy, flexible, transoral;                        diagnostic, including collection of specimen(s) by                        brushing or washing, when performed (separate procedure) Diagnosis Code(s):    --- Professional ---                       R11.2, Nausea with vomiting, unspecified CPT copyright 2017 [employer] Association. All rights reserved. The codes documented in this report are preliminary and upon coder review may  be revised to meet current compliance requirements.  Vonda Antigua, MD Margretta Sidle B. Bonna Gains MD, MD 05/22/2018 11:32:37 AM This report has been signed electronically. Number of Addenda: 0 Note Initiated On: 05/22/2018 11:02 AM Estimated Blood Loss: Estimated blood loss: none.      [facility]

## 2018-05-22 NOTE — Care Management Important Message (Signed)
Important Message  Patient Details  Name: Jessica Blackwell MRN: [id number] Date of Birth: [birth year]   Medicare Important Message Given:  Yes    Juliann Pulse A Jonathan Corpus 05/22/2018, 12:02 PM

## 2018-05-22 NOTE — Progress Notes (Addendum)
Vonda Antigua, MD [address], [place], [place], [place], [address] [address], [place], [place], [place], [address] Phone: [phone]  Fax: [phone]   Subjective:  NG tube placed yesterday.  Nausea vomiting improved.  Objective: Exam: Vital signs in last 24 hours: Vitals:   05/22/18 0603 05/22/18 0607 05/22/18 0647 05/22/18 0753  BP: (!) 152/81 (!) 156/78 (!) 148/73 108/65  Pulse: 97 96 85 79  Resp:    17  Temp:      TempSrc:      SpO2:   93% 90%  Weight:      Height:       Weight change: -3.583 kg  Intake/Output Summary (Last 24 hours) at 05/22/2018 0949 Last data filed at 05/22/2018 0442 Gross per 24 hour  Intake 20 ml  Output 425 ml  Net -405 ml    General: No acute distress, AAO x3 Abd: Soft, NT/ND, No HSM Skin: Warm, no rashes Neck: Supple, Trachea midline   Lab Results: Lab Results  Component Value Date   WBC 11.2 (H) 05/22/2018   HGB 14.8 05/22/2018   HCT 45.3 05/22/2018   MCV 80.6 05/22/2018   PLT 117 (L) 05/22/2018   Micro Results: No results found for this or any previous visit (from the past 240 hour(s)). Studies/Results: Ct Abdomen Pelvis Wo Contrast  Result Date: 05/20/2018 CLINICAL DATA:  Fall.  Blunt abdominal trauma.  Low back pain. EXAM: CT ABDOMEN AND PELVIS WITHOUT CONTRAST TECHNIQUE: Multidetector CT imaging of the abdomen and pelvis was performed following the standard protocol without IV contrast. COMPARISON:  02/20/2018 CT abdomen/pelvis. FINDINGS: Lower chest: Nonspecific scarring versus atelectasis at the dependent lung bases. Mild cardiomegaly. Coronary atherosclerosis. Hepatobiliary: Normal liver size. No liver mass. Cholelithiasis. No gallbladder wall thickening or pericholecystic fluid. No biliary ductal dilatation. Periampullary duodenal diverticulum. Pancreas: Normal, with no mass or duct dilation. Spleen: Normal size. No mass. Adrenals/Urinary Tract: Normal adrenals. Multiple nonobstructing lower right renal  stones, largest 4 mm. No left renal stones. No hydronephrosis. Minimally complex 5.6 cm left renal cyst with thin internal septal calcification, stable since 02/20/2018 CT. Simple 1.2 cm posterior lower left renal cyst. No contour deforming right renal lesions. Normal caliber ureters, with no ureteral stones. Normal bladder. Stomach/Bowel: Normal non-distended stomach. Small periumbilical ventral abdominal hernia containing small bowel loops, unchanged. No small bowel dilatation, focal caliber transition, pneumatosis or wall thickening. Appendix not discretely visualized. No pericecal inflammatory changes. Stable postsurgical changes in distal colon with intact appearing distal colonic anastomosis. Marked sigmoid diverticulosis, with no large bowel wall thickening or significant acute pericolonic fat stranding. Vascular/Lymphatic: Atherosclerotic nonaneurysmal abdominal aorta. No pathologically enlarged lymph nodes in the abdomen or pelvis. Reproductive: Status post hysterectomy, with no abnormal findings at the vaginal cuff. No adnexal mass. Other: No pneumoperitoneum, ascites or focal fluid collection. Musculoskeletal: No aggressive appearing focal osseous lesions. No acute fracture. No pelvic diastasis. Marked lumbar spondylosis. IMPRESSION: 1. No acute traumatic injury in the abdomen or pelvis. 2. No acute abnormality. No evidence of bowel obstruction or acute bowel inflammation. Stable periumbilical ventral abdominal hernia containing small bowel loops without acute complication. Stable postsurgical changes in the distal colon with marked sigmoid diverticulosis and no evidence of acute diverticulitis. 3. Cholelithiasis. No evidence of acute cholecystitis. No biliary ductal dilatation. 4. Nonobstructing right nephrolithiasis. 5.  Aortic Atherosclerosis (ICD10-I70.0). Electronically Signed   By: Ilona Sorrel M.D.   On: 05/20/2018 12:21   Dg Chest 1 View  Result Date: 05/20/2018 CLINICAL DATA:  Post  fall,  history CHF, hypertension EXAM: CHEST  1 VIEW COMPARISON:  12/22/2016 FINDINGS: Enlargement of cardiac silhouette. Enlarged central pulmonary arteries seen on previous exam. However, marked enlargement of the LEFT hilum versus the prior study, could be related to progressive pulmonary arterial enlargement or hilar mass/adenopathy. Bibasilar atelectasis. Chronic bronchitic changes and accentuation of perihilar markings. No pleural effusion or pneumothorax. Known LEFT mid lung mass, shown to be a pleural-based soft tissue mass on a prior CT, 3.3 cm greatest diameter grossly unchanged. Osseous demineralization with chronic RIGHT rotator cuff tear. IMPRESSION: Enlargement of cardiac silhouette. Known pleural-based LEFT lung mass, grossly stable. Bronchitic changes with bibasilar atelectasis. Significant enlargement of the pulmonary hila, increased especially on LEFT since previous exam, could be due to progressive pulmonary arterial hypertension although hilar mass/adenopathy not excluded; if further workup is clinically indicated, this could be best assessed by CT chest with IV contrast. Electronically Signed   By: Lavonia Dana M.D.   On: 05/20/2018 12:26   Dg Chest Port 1 View  Result Date: 05/21/2018 CLINICAL DATA:  84 year old female status post nasogastric tube placement EXAM: PORTABLE CHEST 1 VIEW COMPARISON:  Prior chest x-ray obtained yesterday; prior CT scan of the chest 01/15/2013 FINDINGS: A nasogastric tube is now present. The tip lies off the field of view, below the diaphragm and presumably within the stomach. Stable cardiac and mediastinal contours including marked enlargement of the main pulmonary. A probable pleural base mass in the left mid lung remains unchanged at approximately 3 cm. Background chronic bronchitic changes. No pneumothorax. Small bilateral layering effusions, unchanged. No acute osseous abnormality. IMPRESSION: 1. The tip of the nasogastric tube lies below the field of view, below  the diaphragm and likely within the stomach. 2. Stable left peripheral mid lung mass dating back to at least 01/15/2013. 3. Markedly enlarged main pulmonary artery as can be seen in the setting of pulmonary arterial hypertension. Electronically Signed   By: Jacqulynn Cadet M.D.   On: 05/21/2018 17:05   Dg Abd Portable 1v  Result Date: 05/21/2018 CLINICAL DATA:  Vomiting since last night EXAM: PORTABLE ABDOMEN - 1 VIEW COMPARISON:  None. FINDINGS: There is severe gaseous distension of the stomach. There is no evidence of pneumoperitoneum, portal venous gas or pneumatosis. There are no pathologic calcifications along the expected course of the ureters. The osseous structures are unremarkable. IMPRESSION: Severe gaseous distension of the stomach which may reflect gastric outlet obstruction. Electronically Signed   By: Kathreen Devoid   On: 05/21/2018 11:17   Medications:  Scheduled Meds: . aspirin EC  81 mg Oral Daily  . enoxaparin (LOVENOX) injection  30 mg Subcutaneous Q24H  . furosemide  20 mg Intravenous BID  . gabapentin  300 mg Oral Daily  . insulin aspart  0-5 Units Subcutaneous QHS  . insulin aspart  0-9 Units Subcutaneous TID WC  . irbesartan  37.5 mg Oral Daily  . metoprolol tartrate  5 mg Intravenous Q6H  . pantoprazole (PROTONIX) IV  40 mg Intravenous Q12H  . sodium chloride flush  3 mL Intravenous Q12H  . spironolactone  12.5 mg Oral Daily   Continuous Infusions: . sodium chloride 20 mL/hr at 05/22/18 0342   PRN Meds:.acetaminophen **OR** acetaminophen, hydrALAZINE, hydrOXYzine, ondansetron **OR** ondansetron (ZOFRAN) IV, prochlorperazine, promethazine   Assessment: Active Problems:   Acute respiratory failure with hypoxia [facility])  84 year old female with nausea and vomiting, and x-ray showing gastric distention, with CT scan in June 2019 reporting duodenal inflammation  Plan: We will plan  for EGD today to evaluate for gastric outlet obstruction Possible peptic ulcer disease  from Aleve use in the past Continue PPI twice daily Continue n.p.o. Await EGD report, and follow-up report for findings and recommendations  I have discussed alternative options, risks & benefits,  which include, but are not limited to, bleeding, infection, perforation,respiratory complication & drug reaction.  The patient agrees with this plan & written consent will be obtained.    Dr. Alice Reichert is on call GI over the weekend. Pt has been signed out to him and he will be following the patient. Please page him with any questions   LOS: 2 days   Vonda Antigua, MD 05/22/2018, 9:49 AM

## 2018-05-23 LAB — CBC
HCT: 42.8 % (ref 35.0–47.0)
Hemoglobin: 14 g/dL (ref 12.0–16.0)
MCH: 26.4 pg (ref 26.0–34.0)
MCHC: 32.7 g/dL (ref 32.0–36.0)
MCV: 80.6 fL (ref 80.0–100.0)
Platelets: 106 10*3/uL — ABNORMAL LOW (ref 150–440)
RBC: 5.31 MIL/uL — ABNORMAL HIGH (ref 3.80–5.20)
RDW: 15.1 % — AB (ref 11.5–14.5)
WBC: 8.3 10*3/uL (ref 3.6–11.0)

## 2018-05-23 LAB — BASIC METABOLIC PANEL
Anion gap: 8 (ref 5–15)
BUN: 35 mg/dL — AB (ref 8–23)
CALCIUM: 9 mg/dL (ref 8.9–10.3)
CO2: 29 mmol/L (ref 22–32)
CREATININE: 0.67 mg/dL (ref 0.44–1.00)
Chloride: 102 mmol/L (ref 98–111)
GFR calc Af Amer: >60 mL/min (ref >60)
GFR calc non Af Amer: >60 mL/min (ref >60)
Glucose, Bld: 155 mg/dL — ABNORMAL HIGH (ref 70–99)
Potassium: 3.6 mmol/L (ref 3.5–5.1)
SODIUM: 139 mmol/L (ref 135–145)

## 2018-05-23 LAB — GLUCOSE, CAPILLARY
GLUCOSE-CAPILLARY: 134 mg/dL — AB (ref 70–99)
GLUCOSE-CAPILLARY: 184 mg/dL — AB (ref 70–99)
GLUCOSE-CAPILLARY: 149 mg/dL — AB (ref 70–99)
Glucose-Capillary: 157 mg/dL — ABNORMAL HIGH (ref 70–99)

## 2018-05-23 LAB — MRSA PCR SCREENING: MRSA BY PCR: POSITIVE — AB

## 2018-05-23 MED ORDER — CHLORHEXIDINE GLUCONATE CLOTH 2 % EX PADS
6.0000 | MEDICATED_PAD | Freq: Every day | CUTANEOUS | Status: DC
  Administered 2018-05-24 – 2018-05-25 (×2): 6 via TOPICAL

## 2018-05-23 MED ORDER — ADENOSINE 6 MG/2ML IV SOLN
6.0000 mg | Freq: Once | INTRAVENOUS | Status: DC
  Filled 2018-05-23 (×2): qty 2

## 2018-05-23 MED ORDER — AMIODARONE IV BOLUS ONLY 150 MG/100ML
150.0000 mg | Freq: Once | INTRAVENOUS | Status: AC
  Administered 2018-05-23: 150 mg via INTRAVENOUS

## 2018-05-23 MED ORDER — AMIODARONE HCL IN DEXTROSE 360-4.14 MG/200ML-% IV SOLN
INTRAVENOUS | Status: AC
  Administered 2018-05-23: 11:00:00
  Filled 2018-05-23: qty 200

## 2018-05-23 MED ORDER — METOPROLOL TARTRATE 5 MG/5ML IV SOLN: 5.0000 mg | INTRAVENOUS | Status: DC | PRN

## 2018-05-23 MED ORDER — AMIODARONE HCL IN DEXTROSE 360-4.14 MG/200ML-% IV SOLN
60.0000 mg/h | INTRAVENOUS | Status: DC
  Administered 2018-05-23: 60 mg/h via INTRAVENOUS

## 2018-05-23 MED ORDER — SODIUM CHLORIDE 0.9 % IV BOLUS
500.0000 mL | Freq: Once | INTRAVENOUS | Status: AC
  Administered 2018-05-23: 500 mL via INTRAVENOUS

## 2018-05-23 MED ORDER — METOPROLOL TARTRATE 50 MG PO TABS
50.0000 mg | ORAL_TABLET | Freq: Two times a day (BID) | ORAL | Status: DC
  Administered 2018-05-23 – 2018-05-24 (×3): 50 mg via ORAL
  Filled 2018-05-23 (×4): qty 1

## 2018-05-23 MED ORDER — FUROSEMIDE 40 MG PO TABS
40.0000 mg | ORAL_TABLET | Freq: Two times a day (BID) | ORAL | Status: DC
  Administered 2018-05-24 – 2018-05-26 (×5): 40 mg via ORAL
  Filled 2018-05-23 (×5): qty 1

## 2018-05-23 MED ORDER — AMIODARONE LOAD VIA INFUSION
150.0000 mg | Freq: Once | INTRAVENOUS | Status: AC
  Administered 2018-05-23: 150 mg via INTRAVENOUS

## 2018-05-23 MED ORDER — AMIODARONE HCL IN DEXTROSE 360-4.14 MG/200ML-% IV SOLN: 30.0000 mg/h | INTRAVENOUS | Status: DC

## 2018-05-23 MED ORDER — MUPIROCIN 2 % EX OINT
1.0000 "application " | TOPICAL_OINTMENT | Freq: Two times a day (BID) | CUTANEOUS | Status: DC
  Administered 2018-05-23 – 2018-05-26 (×7): 1 via NASAL
  Filled 2018-05-23: qty 22

## 2018-05-23 MED ORDER — AMIODARONE HCL 200 MG PO TABS
200.0000 mg | ORAL_TABLET | Freq: Two times a day (BID) | ORAL | Status: DC
  Administered 2018-05-23 – 2018-05-24 (×2): 200 mg via ORAL
  Filled 2018-05-23 (×3): qty 1

## 2018-05-23 NOTE — Progress Notes (Signed)
Dr. Saralyn Pilar said she is okay to go home heart wise; however, he does not see how she will take care of herself at home.  PT recommended SNF for the same reason, she has 3 steps to get into her home.  RN / MD are thinking case management can work with the family to plan for patient's care after the hospital.  RN will put in consult for social work.  Phillis Knack, RN

## 2018-05-23 NOTE — Progress Notes (Signed)
Gastroenterology Coverage note.  Chart reviewed, procedure results noted.  Called by RN to advance patient diet from soft to solid as per patient and family wishes.  I ordered a carb modified diet.  Endoscopy showed no evidence of gastric outlet obstruction, ulceration or tumor. Vital signs are currently stable. I advised continued diet as tolerated. Call us back if needed.

## 2018-05-23 NOTE — Anesthesia Postprocedure Evaluation (Signed)
Anesthesia Post Note  Patient: Jessica Blackwell  Procedure(s) Performed: ESOPHAGOGASTRODUODENOSCOPY (EGD) (N/A )  Patient location during evaluation: PACU Anesthesia Type: General Level of consciousness: awake and alert Pain management: pain level controlled Vital Signs Assessment: post-procedure vital signs reviewed and stable Respiratory status: spontaneous breathing, nonlabored ventilation, respiratory function stable and patient connected to nasal cannula oxygen Cardiovascular status: blood pressure returned to baseline and stable Postop Assessment: no apparent nausea or vomiting Anesthetic complications: no     Last Vitals:  Vitals:   05/23/18 1105 05/23/18 1110  BP: (!) 78/57 111/62  Pulse: (!) 143 96  Resp: 18 (!) 24  Temp:    SpO2: 95% 95%    Last Pain:  Vitals:   05/23/18 1028  TempSrc: Oral  PainSc:                  Elianie Hubers S

## 2018-05-23 NOTE — Progress Notes (Signed)
[place] [facility] [place] NAME: Jessica Blackwell    MR#:  [id number]  DATE OF BIRTH:  [birth year]  SUBJECTIVE:  Patient went into SVT and did not respond to adenosine, got transferred to ICU and amiodarone drip was started by Dr. Saralyn Pilar  had her endoscopy EGD 05/22/2018 .  Family members at bedside today  REVIEW OF SYSTEMS:  Review of Systems  Constitutional: Negative for chills and fever.  HENT: Positive for sore throat. Negative for congestion.   Eyes: Negative for blurred vision and double vision.  Respiratory: Negative for cough and shortness of breath.   Cardiovascular: Positive for palpitations. Negative for chest pain and leg swelling.  Gastrointestinal: Positive for abdominal pain, nausea and vomiting. Negative for constipation and diarrhea.  Genitourinary: Negative for dysuria and frequency.  Musculoskeletal: Negative for back pain and neck pain.  Neurological: Negative for dizziness and headaches.  Psychiatric/Behavioral: Negative for depression. The patient is not nervous/anxious.     DRUG ALLERGIES:   Allergies  Allergen Reactions  . Codeine Other (See Comments)    Upset stomach  . Ciprofloxacin Itching and Rash  . Sulfamethoxazole-Trimethoprim Rash   VITALS:  Blood pressure (!) 111/55, pulse 76, temperature 98.2 F (36.8 C), temperature source Oral, resp. rate 17, height 5\' 1"  (1.549 m), weight 63.4 kg, SpO2 98 %. PHYSICAL EXAMINATION:  Physical Exam  GENERAL:  84 y.o.-year-old patient lying in the bed with no acute distress.  EYES: Pupils equal, round, reactive to light and accommodation. No scleral icterus. Extraocular muscles intact.  HEENT: Head atraumatic, normocephalic. Oropharynx and nasopharynx clear. NG tube in place. NECK:  Supple, no jugular venous distention. No thyroid enlargement, no tenderness.  LUNGS: Normal breath sounds bilaterally, no wheezing, rales,rhonchi or crepitation. No use of accessory muscles of  respiration. [place] in place. CARDIOVASCULAR: S1, S2 normal. 3/6 systolic murmur, no rubs, or gallops.  ABDOMEN: Soft, non-tender, non-distended. Bowel sounds present. No organomegaly or mass.  EXTREMITIES: No pedal edema, cyanosis, or clubbing.  NEUROLOGIC: Cranial nerves II through XII are intact. Muscle strength 5/5 in all extremities. Sensation intact. Gait not checked.  PSYCHIATRIC: The patient is alert and oriented x 3.  SKIN: No rash, lesion, or ulcer.  LABORATORY PANEL:  Female CBC Recent Labs  Lab 05/23/18 0558  WBC 8.3  HGB 14.0  HCT 42.8  PLT 106*   ------------------------------------------------------------------------------------------------------------------ Chemistries  Recent Labs  Lab 05/22/18 0324 05/23/18 0558  NA 142 139  K 3.5 3.6  CL 102 102  CO2 29 29  GLUCOSE 164* 155*  BUN 30* 35*  CREATININE 1.13* 0.67  CALCIUM 9.5 9.0  AST 12*  --   ALT 12  --   ALKPHOS 50  --   BILITOT 1.5*  --    RADIOLOGY:  No results found. ASSESSMENT AND PLAN:   SVT Did not respond to adenosine Transferred to ICU and patient is started on amiodarone drip [facility] is following  Acute hypoxic respiratory failure- due to acute on chronic systolic CHF. Last ECHO with EF 25-30%. On 2L O2  Renal function is back to normal resume Lasix in a.m. if patient is stable clinically - ECHO with moderate LVH and moderate to severe stenosis, no comment on EF. - cardiology [place] following - continue metoprolol and spironolactone - irbesartan added this admission, but will hold for today - duonebs prn and incentive spirometry - wean O2 as able  Gastric outlet obstruction- seen on abdominal x-ray on 9/5. Nausea and  vomiting has resolved with NG tube (placed 9/5). - CT abdomen/pelvis unremarkable -EGD 05/22/2018 was normal - GI Dr. Alice Reichert following-diet advanced to cardiac, patient is tolerating fine - continue zofran and phenergan prn  Acute kidney injury- Cr increased from 0.64 >  1.13--0.67 today.  .- hold IV lasix for today - hold irbesartan - encouraged po intake  Elevated troponin- troponins 0.04, 0.07, 0.12, 0.08. Likely demand ischemia in the setting of CHF exacerbation. No active chest pain. -No cardiac interventions needed at this time  Neuropathy- stable - continue gabapentin  GERD- stable - continue protonix  Generalized weakness/deconditioning- likely due to age - PT consult  New diagnosis of T2DM- A1c 7.2% this admission - sensitive SSI - needs to f/u with PCP  Chronic left mid lung mass- stable, seen on previous CT - monitor as outpatient  All the records are reviewed and case discussed with Care Management/Social Worker. Management plans discussed with the patient, family and they are in agreement.  CODE STATUS: Full Code  TOTAL TIME TAKING CARE OF THIS PATIENT: 35 minutes.   More than 50% of the time was spent in counseling/coordination of care: YES  POSSIBLE D/C IN 2-3 DAYS, DEPENDING ON CLINICAL CONDITION.   Nicholes Mango M.D on 05/23/2018 at 3:16 PM  Between 7am to 6pm - Pager - [phone]  After 6pm go to [email or web address] - Proofreader  [facility]  Office  [phone]  CC: Primary care physician; Idelle Crouch, MD  Note: This dictation was prepared with Dragon dictation along with smaller phrase technology. Any transcriptional errors that result from this process are unintentional.

## 2018-05-23 NOTE — Plan of Care (Signed)

## 2018-05-23 NOTE — Progress Notes (Signed)
Patient's heart rate rose to 180s-190s and stayed.  Dr. Saralyn Pilar was on the unit and saw it on the monitor and checked on the patient.  RN saw elevated heart rate on the monitor and went to check the patient.  Dr. Saralyn Pilar ordered Adenosine 6mg  and another 12mg  to be ready if it did not work.    A new IV was inserted because the old IV was no longer patent.  RN team tested the IV with a flush, then pushed the first dose and 2 flushes after.  Then RN team pushed the second dose and 2 flushes after.  Heart rate did not come down.  Dr. Saralyn Pilar ordered an amiodarone bolus and a transfer to ICU.  RN called report to the ICU nurse as 2 other RNs walked her to the ICU.  Phillis Knack, RN

## 2018-05-23 NOTE — Progress Notes (Signed)
[facility] Cardiology  SUBJECTIVE: Patient laying in bed, denies chest pain or shortness of breath   Vitals:   05/22/18 1548 05/22/18 1952 05/23/18 0452 05/23/18 0738  BP: (!) 128/59 (!) 116/57 (!) 149/77 131/66  Pulse: 100 81 91 79  Resp: 18 18 18 16   Temp: 98.4 F (36.9 C) 97.8 F (36.6 C) 98.3 F (36.8 C) 98.5 F (36.9 C)  TempSrc: Oral Oral Oral Oral  SpO2: 98% 96% 96% 97%  Weight:   61.5 kg   Height:         Intake/Output Summary (Last 24 hours) at 05/23/2018 0931 Last data filed at 05/23/2018 0452 Gross per 24 hour  Intake 303 ml  Output 250 ml  Net 53 ml      PHYSICAL EXAM  General: Well developed, well nourished, in no acute distress HEENT:  Normocephalic and atramatic Neck:  No JVD.  Lungs: Clear bilaterally to auscultation and percussion. Heart: HRRR . Normal S1 and S2 without gallops or murmurs.  Abdomen: Bowel sounds are positive, abdomen soft and non-tender  Msk:  Back normal, normal gait. Normal strength and tone for age. Extremities: No clubbing, cyanosis or edema.   Neuro: Alert and oriented X 3. Psych:  Good affect, responds appropriately   LABS: Basic Metabolic Panel: Recent Labs    05/22/18 0324 05/23/18 0558  NA 142 139  K 3.5 3.6  CL 102 102  CO2 29 29  GLUCOSE 164* 155*  BUN 30* 35*  CREATININE 1.13* 0.67  CALCIUM 9.5 9.0   Liver Function Tests: Recent Labs    05/22/18 0324  AST 12*  ALT 12  ALKPHOS 50  BILITOT 1.5*  PROT 7.4  ALBUMIN 4.5   No results for input(s): LIPASE, AMYLASE in the last 72 hours. CBC: Recent Labs    05/20/18 1130  05/22/18 0324 05/23/18 0558  WBC 6.2   < > 11.2* 8.3  NEUTROABS 4.5  --   --   --   HGB 14.0   < > 14.8 14.0  HCT 43.6   < > 45.3 42.8  MCV 80.5   < > 80.6 80.6  PLT 114*   < > 117* 106*   < > = values in this interval not displayed.   Cardiac Enzymes: Recent Labs    05/21/18 1027 05/21/18 1610 05/22/18 1515  TROPONINI 0.07* 0.12* 0.08*   BNP: Invalid input(s):  POCBNP D-Dimer: No results for input(s): DDIMER in the last 72 hours. Hemoglobin A1C: Recent Labs    05/21/18 1026  HGBA1C 7.2*   Fasting Lipid Panel: No results for input(s): CHOL, HDL, LDLCALC, TRIG, CHOLHDL, LDLDIRECT in the last 72 hours. Thyroid Function Tests: No results for input(s): TSH, T4TOTAL, T3FREE, THYROIDAB in the last 72 hours.  Invalid input(s): FREET3 Anemia Panel: No results for input(s): VITAMINB12, FOLATE, FERRITIN, TIBC, IRON, RETICCTPCT in the last 72 hours.  Dg Chest Port 1 View  Result Date: 05/21/2018 CLINICAL DATA:  84 year old female status post nasogastric tube placement EXAM: PORTABLE CHEST 1 VIEW COMPARISON:  Prior chest x-ray obtained yesterday; prior CT scan of the chest 01/15/2013 FINDINGS: A nasogastric tube is now present. The tip lies off the field of view, below the diaphragm and presumably within the stomach. Stable cardiac and mediastinal contours including marked enlargement of the main pulmonary. A probable pleural base mass in the left mid lung remains unchanged at approximately 3 cm. Background chronic bronchitic changes. No pneumothorax. Small bilateral layering effusions, unchanged. No acute osseous abnormality. IMPRESSION: 1. The  tip of the nasogastric tube lies below the field of view, below the diaphragm and likely within the stomach. 2. Stable left peripheral mid lung mass dating back to at least 01/15/2013. 3. Markedly enlarged main pulmonary artery as can be seen in the setting of pulmonary arterial hypertension. Electronically Signed   By: Jacqulynn Cadet M.D.   On: 05/21/2018 17:05   Dg Abd Portable 1v  Result Date: 05/21/2018 CLINICAL DATA:  Vomiting since last night EXAM: PORTABLE ABDOMEN - 1 VIEW COMPARISON:  None. FINDINGS: There is severe gaseous distension of the stomach. There is no evidence of pneumoperitoneum, portal venous gas or pneumatosis. There are no pathologic calcifications along the expected course of the ureters. The  osseous structures are unremarkable. IMPRESSION: Severe gaseous distension of the stomach which may reflect gastric outlet obstruction. Electronically Signed   By: Kathreen Devoid   On: 05/21/2018 11:17     Echo Moderate reduced left ventricular function, moderate aortic stenosis  TELEMETRY: Sinus rhythm:  ASSESSMENT AND PLAN:  Active Problems:   Acute respiratory failure with hypoxia (HCC)   Non-intractable vomiting with nausea    1.  Acute on chronic systolic congestive heart failure, improved after diuresis 2.  Moderate to severe aortic stenosis, not a candidate for TAVR or AVR 3.  Borderline elevated troponin, likely demand supply ischemia 4.  Gastric outlet obstruction, improved  Recommendations  1.  Agree with current therapy 2.  Continue diuresis 3.  Carefully monitor renal status 4.  Case management consult  Sign off for now, please call if any questions   Isaias Cowman, MD, PhD, [facility] 05/23/2018 9:31 AM

## 2018-05-23 NOTE — Clinical Social Work Note (Signed)
CSW received consult that "cardiologist said that patient can go but needs more help than home alone." The CSW will assess when able as PT is recommending SNF placement. Should the patient decline SNF, the CSW will alert the [facility] of possible home health needs.  Santiago Bumpers, MSW, [facility] Presser [phone]

## 2018-05-24 LAB — GLUCOSE, CAPILLARY
GLUCOSE-CAPILLARY: 148 mg/dL — AB (ref 70–99)
Glucose-Capillary: 132 mg/dL — ABNORMAL HIGH (ref 70–99)
Glucose-Capillary: 163 mg/dL — ABNORMAL HIGH (ref 70–99)
Glucose-Capillary: 167 mg/dL — ABNORMAL HIGH (ref 70–99)

## 2018-05-24 MED ORDER — PANTOPRAZOLE SODIUM 40 MG PO TBEC
40.0000 mg | DELAYED_RELEASE_TABLET | Freq: Two times a day (BID) | ORAL | Status: DC
  Administered 2018-05-24 – 2018-05-26 (×4): 40 mg via ORAL
  Filled 2018-05-24 (×4): qty 1

## 2018-05-24 MED ORDER — AMIODARONE HCL 200 MG PO TABS
200.0000 mg | ORAL_TABLET | Freq: Every day | ORAL | Status: DC
  Administered 2018-05-25 – 2018-05-26 (×2): 200 mg via ORAL
  Filled 2018-05-24 (×2): qty 1

## 2018-05-24 NOTE — Progress Notes (Signed)
[facility] Cardiology  SUBJECTIVE: Patient laying in bed, reports doing well, denies chest pain or shortness of breath   Vitals:   05/23/18 1700 05/23/18 2001 05/24/18 0406 05/24/18 0810  BP: 115/86 (!) 119/53 128/79 (!) 143/72  Pulse: 78 73 63 67  Resp: (!) 24 18 18    Temp:  98.5 F (36.9 C) (!) 97.5 F (36.4 C) 98.1 F (36.7 C)  TempSrc:  Oral Oral Oral  SpO2: 94% 96% 98% 99%  Weight:   63.1 kg   Height:         Intake/Output Summary (Last 24 hours) at 05/24/2018 1048 Last data filed at 05/24/2018 0400 Gross per 24 hour  Intake 120 ml  Output 600 ml  Net -480 ml      PHYSICAL EXAM  General: Well developed, well nourished, in no acute distress HEENT:  Normocephalic and atramatic Neck:  No JVD.  Lungs: Clear bilaterally to auscultation and percussion. Heart: HRRR . Normal S1 and S2 without gallops or murmurs.  Abdomen: Bowel sounds are positive, abdomen soft and non-tender  Msk:  Back normal, normal gait. Normal strength and tone for age. Extremities: No clubbing, cyanosis or edema.   Neuro: Alert and oriented X 3. Psych:  Good affect, responds appropriately   LABS: Basic Metabolic Panel: Recent Labs    05/22/18 0324 05/23/18 0558  NA 142 139  K 3.5 3.6  CL 102 102  CO2 29 29  GLUCOSE 164* 155*  BUN 30* 35*  CREATININE 1.13* 0.67  CALCIUM 9.5 9.0   Liver Function Tests: Recent Labs    05/22/18 0324  AST 12*  ALT 12  ALKPHOS 50  BILITOT 1.5*  PROT 7.4  ALBUMIN 4.5   No results for input(s): LIPASE, AMYLASE in the last 72 hours. CBC: Recent Labs    05/22/18 0324 05/23/18 0558  WBC 11.2* 8.3  HGB 14.8 14.0  HCT 45.3 42.8  MCV 80.6 80.6  PLT 117* 106*   Cardiac Enzymes: Recent Labs    05/21/18 1610 05/22/18 1515  TROPONINI 0.12* 0.08*   BNP: Invalid input(s): POCBNP D-Dimer: No results for input(s): DDIMER in the last 72 hours. Hemoglobin A1C: No results for input(s): HGBA1C in the last 72 hours. Fasting Lipid Panel: No results for  input(s): CHOL, HDL, LDLCALC, TRIG, CHOLHDL, LDLDIRECT in the last 72 hours. Thyroid Function Tests: No results for input(s): TSH, T4TOTAL, T3FREE, THYROIDAB in the last 72 hours.  Invalid input(s): FREET3 Anemia Panel: No results for input(s): VITAMINB12, FOLATE, FERRITIN, TIBC, IRON, RETICCTPCT in the last 72 hours.  No results found.   Echo moderately reduced left ventricular function, with moderate aortic stenosis  TELEMETRY: Sinus rhythm at 60 bpm:  ASSESSMENT AND PLAN:  Active Problems:   Acute respiratory failure with hypoxia (HCC)   Non-intractable vomiting with nausea    1.  Acute on chronic systolic congestive heart failure, improved after diuresis 2.  Moderate to severe aortic stenosis, not a candidate for TAVR or AVR 3.  Borderline elevated troponin, likely due to demand supply ischemia 4.  Paroxysmal SVT, currently in sinus rhythm  Recommendations  1.  Agree with current therapy 2.  Decrease amiodarone to 200 mg daily 3.  Continue metoprolol tartrate 50 mg twice daily 4.  Increase activity 5.  Disposition to be determined   Isaias Cowman, MD, PhD, [facility] 05/24/2018 10:48 AM

## 2018-05-24 NOTE — Progress Notes (Signed)
PHARMACIST - PHYSICIAN COMMUNICATION   CONCERNING: IV to Oral Route Change Policy  RECOMMENDATION: This patient is receiving PROTONIX by the intravenous route.  Based on criteria approved by the Pharmacy and Therapeutics Committee, the intravenous medication(s) is/are being converted to the equivalent oral dose form(s).   DESCRIPTION: These criteria include:  The patient is eating (either orally or via tube) and/or has been taking other orally administered medications for a least 24 hours  The patient has no evidence of active gastrointestinal bleeding or impaired GI absorption (gastrectomy, short bowel, patient on TNA or NPO).  If you have questions about this conversion, please contact the Pharmacy Department  []   [phone] )  [facility] [x]   [phone] )  [facility] []   [phone] )  [facility] []   [phone] )  [facility] []   [phone] )  [place], [facility] 05/24/2018 3:45 PM

## 2018-05-24 NOTE — Progress Notes (Signed)
[name] at [place] NAME: Jessica Blackwell    MR#:  [id number]  DATE OF BIRTH:  September 25, 1924  SUBJECTIVE:  Patient is feeling much better today denies any palpitations and resting comfortably Patient went into SVT and did not respond to adenosine, got transferred to ICU and amiodarone drip was started by Dr. Saralyn Pilar on 05/23/18, transferred back to floor at the same day as heart rate is better had her endoscopy EGD 05/22/2018 .  No family members at bedside today  REVIEW OF SYSTEMS:  Review of Systems  Constitutional: Negative for chills and fever.  HENT: Positive for sore throat. Negative for congestion.   Eyes: Negative for blurred vision and double vision.  Respiratory: Negative for cough and shortness of breath.   Cardiovascular: Negative for chest pain, palpitations and leg swelling.  Gastrointestinal: Negative for abdominal pain, constipation, diarrhea, nausea and vomiting.  Genitourinary: Negative for dysuria and frequency.  Musculoskeletal: Negative for back pain and neck pain.  Neurological: Negative for dizziness and headaches.  Psychiatric/Behavioral: Negative for depression. The patient is not nervous/anxious.     DRUG ALLERGIES:   Allergies  Allergen Reactions  . Codeine Other (See Comments)    Upset stomach  . Ciprofloxacin Itching and Rash  . Sulfamethoxazole-Trimethoprim Rash   VITALS:  Blood pressure (!) 143/72, pulse 67, temperature 98.1 F (36.7 C), temperature source Oral, resp. rate 18, height 5\' 1"  (1.549 m), weight 63.1 kg, SpO2 99 %. PHYSICAL EXAMINATION:  Physical Exam  GENERAL:  84 y.o.-year-old patient lying in the bed with no acute distress.  EYES: Pupils equal, round, reactive to light and accommodation. No scleral icterus. Extraocular muscles intact.  HEENT: Head atraumatic, normocephalic. Oropharynx and nasopharynx clear. NG tube in place. NECK:  Supple, no jugular venous distention. No thyroid  enlargement, no tenderness.  LUNGS: Normal breath sounds bilaterally, no wheezing, rales,rhonchi or crepitation. No use of accessory muscles of respiration. [place] in place. CARDIOVASCULAR: S1, S2 normal. 3/6 systolic murmur, no rubs, or gallops.  ABDOMEN: Soft, non-tender, non-distended. Bowel sounds present. No organomegaly or mass.  EXTREMITIES: No pedal edema, cyanosis, or clubbing.  NEUROLOGIC: Cranial nerves II through XII are intact. Muscle strength 5/5 in all extremities. Sensation intact. Gait not checked.  PSYCHIATRIC: The patient is alert and oriented x 3.  SKIN: No rash, lesion, or ulcer.  LABORATORY PANEL:  Female CBC Recent Labs  Lab 05/23/18 0558  WBC 8.3  HGB 14.0  HCT 42.8  PLT 106*   ------------------------------------------------------------------------------------------------------------------ Chemistries  Recent Labs  Lab 05/22/18 0324 05/23/18 0558  NA 142 139  K 3.5 3.6  CL 102 102  CO2 29 29  GLUCOSE 164* 155*  BUN 30* 35*  CREATININE 1.13* 0.67  CALCIUM 9.5 9.0  AST 12*  --   ALT 12  --   ALKPHOS 50  --   BILITOT 1.5*  --    RADIOLOGY:  No results found. ASSESSMENT AND PLAN:     Paroxysmal SVT Currently in sinus rhythm Metoprolol 50 mill grams twice daily and decreased amiodarone to 200 mg daily [facility] is following  Acute hypoxic respiratory failure- due to acute on chronic systolic CHF. Last ECHO with EF 25-30%. On 2L O2  Renal function is back to normal,  resumed Lasix  - ECHO with moderate LVH and moderate to severe stenosis, no comment on EF. - cardiology [place] following - continue metoprolol and spironolactone - irbesartan added this admission, but will hold for today -  duonebs prn and incentive spirometry - wean O2 as able  Abnormal urinalysis patient is a symptomatic  urine culture ordered not considering antibiotics at this time  Gastric outlet obstruction- seen on abdominal x-ray on 9/5. Nausea and vomiting has resolved  with NG tube (placed 9/5). - CT abdomen/pelvis unremarkable -EGD 05/22/2018 was normal - GI Dr. Alice Reichert following-diet advanced to cardiac, patient is tolerating fine - continue zofran and phenergan prn  Acute kidney injury- Cr increased from 0.64 > 1.13--0.67   .-Resolved. -Resume Lasix - hold irbesartan - encouraged po intake  Elevated troponin- troponins 0.04, 0.07, 0.12, 0.08. Likely demand ischemia in the setting of CHF exacerbation. No active chest pain. -No cardiac interventions needed at this time  Neuropathy- stable - continue gabapentin  GERD- stable - continue protonix  Generalized weakness/deconditioning- likely due to age - PT consult-recommending skilled nursing facility  New diagnosis of T2DM- A1c 7.2% this admission - sensitive SSI - needs to f/u with PCP  Chronic left mid lung mass- stable, seen on previous CT - monitor as outpatient    All the records are reviewed and case discussed with Care Management/Social Worker. Management plans discussed with the patient, family and they are in agreement.  CODE STATUS: Full Code  TOTAL TIME TAKING CARE OF THIS PATIENT: 35 minutes.   More than 50% of the time was spent in counseling/coordination of care: YES  POSSIBLE D/C IN 2-3 DAYS, DEPENDING ON CLINICAL CONDITION.   Nicholes Mango M.D on 05/24/2018 at 1:17 PM  Between 7am to 6pm - Pager - [phone]  After 6pm go to [email or web address] - Proofreader  [facility] [place] [facility]  Office  [phone]  CC: Primary care physician; Idelle Crouch, MD  Note: This dictation was prepared with Dragon dictation along with smaller phrase technology. Any transcriptional errors that result from this process are unintentional.

## 2018-05-24 NOTE — Plan of Care (Signed)

## 2018-05-25 ENCOUNTER — Encounter: Payer: Self-pay | Admitting: Gastroenterology

## 2018-05-25 LAB — CBC
HEMATOCRIT: 39.9 % (ref 35.0–47.0)
HEMOGLOBIN: 13 g/dL (ref 12.0–16.0)
MCH: 26.5 pg (ref 26.0–34.0)
MCHC: 32.7 g/dL (ref 32.0–36.0)
MCV: 81 fL (ref 80.0–100.0)
Platelets: 90 10*3/uL — ABNORMAL LOW (ref 150–440)
RBC: 4.92 MIL/uL (ref 3.80–5.20)
RDW: 14.8 % — ABNORMAL HIGH (ref 11.5–14.5)
WBC: 7.5 10*3/uL (ref 3.6–11.0)

## 2018-05-25 LAB — BASIC METABOLIC PANEL
Anion gap: 9 (ref 5–15)
BUN: 22 mg/dL (ref 8–23)
CO2: 29 mmol/L (ref 22–32)
CREATININE: 0.79 mg/dL (ref 0.44–1.00)
Calcium: 8.4 mg/dL — ABNORMAL LOW (ref 8.9–10.3)
Chloride: 101 mmol/L (ref 98–111)
GFR calc Af Amer: >60 mL/min (ref >60)
GFR calc non Af Amer: >60 mL/min (ref >60)
Glucose, Bld: 178 mg/dL — ABNORMAL HIGH (ref 70–99)
Potassium: 3.8 mmol/L (ref 3.5–5.1)
Sodium: 139 mmol/L (ref 135–145)

## 2018-05-25 LAB — GLUCOSE, CAPILLARY
GLUCOSE-CAPILLARY: 161 mg/dL — AB (ref 70–99)
Glucose-Capillary: 146 mg/dL — ABNORMAL HIGH (ref 70–99)
Glucose-Capillary: 105 mg/dL — ABNORMAL HIGH (ref 70–99)
Glucose-Capillary: 150 mg/dL — ABNORMAL HIGH (ref 70–99)

## 2018-05-25 MED ORDER — ENOXAPARIN SODIUM 40 MG/0.4ML ~~LOC~~ SOLN
40.0000 mg | SUBCUTANEOUS | Status: DC
  Administered 2018-05-25: 40 mg via SUBCUTANEOUS
  Filled 2018-05-25: qty 0.4

## 2018-05-25 MED ORDER — METOPROLOL TARTRATE 25 MG PO TABS
25.0000 mg | ORAL_TABLET | Freq: Two times a day (BID) | ORAL | Status: DC
  Administered 2018-05-25 – 2018-05-26 (×2): 25 mg via ORAL
  Filled 2018-05-25 (×2): qty 1

## 2018-05-25 NOTE — Progress Notes (Signed)
Jessica Blackwell at [place] NAME: Jessica Blackwell    MR#:  [id number]  DATE OF BIRTH:  11-Apr-1925  SUBJECTIVE:  Patient is feeling much better today denies any palpitations and resting comfortably Patient went into SVT and did not respond to adenosine, got transferred to ICU and amiodarone drip was started by Dr. Saralyn Pilar on 05/23/18, transferred back to floor at the same day as heart rate is better had her endoscopy EGD 05/22/2018 .   Granddaughter at bedside prefers taking her grandmom home rather than skilled nursing facility  REVIEW OF SYSTEMS:  Review of Systems  Constitutional: Negative for chills and fever.  HENT: Negative for congestion and sore throat.   Eyes: Negative for blurred vision and double vision.  Respiratory: Negative for cough and shortness of breath.   Cardiovascular: Negative for chest pain, palpitations and leg swelling.  Gastrointestinal: Negative for abdominal pain, constipation, diarrhea, nausea and vomiting.  Genitourinary: Negative for dysuria and frequency.  Musculoskeletal: Negative for back pain and neck pain.  Neurological: Negative for dizziness and headaches.  Psychiatric/Behavioral: Negative for depression. The patient is not nervous/anxious.     DRUG ALLERGIES:   Allergies  Allergen Reactions  . Codeine Other (See Comments)    Upset stomach  . Ciprofloxacin Itching and Rash  . Sulfamethoxazole-Trimethoprim Rash   VITALS:  Blood pressure 139/64, pulse 69, temperature 98 F (36.7 C), temperature source Oral, resp. rate 18, height 5\' 1"  (1.549 m), weight 64.6 kg, SpO2 93 %. PHYSICAL EXAMINATION:  Physical Exam  GENERAL:  84 y.o.-year-old patient lying in the bed with no acute distress.  EYES: Pupils equal, round, reactive to light and accommodation. No scleral icterus. Extraocular muscles intact.  HEENT: Head atraumatic, normocephalic. Oropharynx and nasopharynx clear. NG tube in place. NECK:  Supple, no  jugular venous distention. No thyroid enlargement, no tenderness.  LUNGS: Normal breath sounds bilaterally, no wheezing, rales,rhonchi or crepitation. No use of accessory muscles of respiration. [place] in place. CARDIOVASCULAR: S1, S2 normal. 3/6 systolic murmur, no rubs, or gallops.  ABDOMEN: Soft, non-tender, non-distended. Bowel sounds present. No organomegaly or mass.  EXTREMITIES: No pedal edema, cyanosis, or clubbing.  NEUROLOGIC: Cranial nerves II through XII are intact. Muscle strength 5/5 in all extremities. Sensation intact. Gait not checked.  PSYCHIATRIC: The patient is alert and oriented x 3.  SKIN: No rash, lesion, or ulcer.  LABORATORY PANEL:  Female CBC Recent Labs  Lab 05/25/18 0424  WBC 7.5  HGB 13.0  HCT 39.9  PLT 90*   ------------------------------------------------------------------------------------------------------------------ Chemistries  Recent Labs  Lab 05/22/18 0324  05/25/18 0424  NA 142   < > 139  K 3.5   < > 3.8  CL 102   < > 101  CO2 29   < > 29  GLUCOSE 164*   < > 178*  BUN 30*   < > 22  CREATININE 1.13*   < > 0.79  CALCIUM 9.5   < > 8.4*  AST 12*  --   --   ALT 12  --   --   ALKPHOS 50  --   --   BILITOT 1.5*  --   --    < > = values in this interval not displayed.   RADIOLOGY:  No results found. ASSESSMENT AND PLAN:     Paroxysmal SVT Currently in sinus rhythm Metoprolol dose decreased to 25 mg twice daily secondary to bradycardia and continue decreased amiodarone to 200 mg daily [facility]  Cardiology is following  Acute hypoxic respiratory failure- due to acute on chronic systolic CHF. Last ECHO with EF 25-30%. On 2L O2  Wean off oxygen as tolerated, overnight pulse ox Renal function is back to normal,  resumed Lasix  - ECHO with moderate LVH and moderate to severe stenosis, no comment on EF. - cardiology [place] following - continue metoprolol and spironolactone -Discontinued irbesartan given the history of moderate to severe aortic  stenosis - duonebs prn and incentive spirometry - wean O2 as able Outpatient follow-up with cardiology Dr. Saralyn Pilar in a week  Abnormal urinalysis patient is a symptomatic  urine culture ordered not considering antibiotics at this time  Gastric outlet obstruction- seen on abdominal x-ray on 9/5. Nausea and vomiting has resolved with NG tube (placed 9/5). - CT abdomen/pelvis unremarkable -EGD 05/22/2018 was normal - GI Dr. Alice Reichert following-diet advanced to cardiac, patient is tolerating fine - continue zofran and phenergan prn  Acute kidney injury- Cr increased from 0.64 > 1.13--0.67   .-Resolved. -Resume Lasix - hold irbesartan - encouraged po intake  Elevated troponin- troponins 0.04, 0.07, 0.12, 0.08. Likely demand ischemia in the setting of CHF exacerbation. No active chest pain. -No cardiac interventions needed at this time  Neuropathy- stable - continue gabapentin  GERD- stable - continue protonix  Generalized weakness/deconditioning- likely due to age - PT consult-recommending skilled nursing facility Patient prefers going home granddaughter also agreeable with home health but will discuss with her mom and aunt  New diagnosis of T2DM- A1c 7.2% this admission - sensitive SSI - needs to f/u with PCP  Chronic left mid lung mass- stable, seen on previous CT - monitor as outpatient  Disposition home with home health  All the records are reviewed and case discussed with Care Management/Social Worker. Management plans discussed with the patient, family and they are in agreement.  CODE STATUS: Full Code  TOTAL TIME TAKING CARE OF THIS PATIENT: 35 minutes.   More than 50% of the time was spent in counseling/coordination of care: YES  POSSIBLE D/C IN 1 DAYS, DEPENDING ON CLINICAL CONDITION.   Nicholes Mango M.D on 05/25/2018 at 4:29 PM  Between 7am to 6pm - Pager - [phone]  After 6pm go to [email or web address] - Proofreader  [facility] [place]  [facility]  Office  [phone]  CC: Primary care physician; Idelle Crouch, MD  Note: This dictation was prepared with Dragon dictation along with smaller phrase technology. Any transcriptional errors that result from this process are unintentional.

## 2018-05-25 NOTE — Progress Notes (Addendum)
[facility] Cardiology  SUBJECTIVE: The patient reports feeling well, denying chest pain, shortness of breath, palpitations, or abdominal pain.   Vitals:   05/24/18 2028 05/25/18 0317 05/25/18 0756 05/25/18 1254  BP: (!) 137/59 (!) 142/63 137/66   Pulse: 64 68 63   Resp:  18    Temp: 98.4 F (36.9 C) 97.9 F (36.6 C) 97.8 F (36.6 C)   TempSrc: Oral  Oral   SpO2: 96% 95% 96% 94%  Weight:  64.6 kg    Height:         Intake/Output Summary (Last 24 hours) at 05/25/2018 1316 Last data filed at 05/25/2018 1108 Gross per 24 hour  Intake 243 ml  Output 1050 ml  Net -807 ml      PHYSICAL EXAM  General: Elderly female sitting on side of bed, in no acute distress HEENT:  Normocephalic and atramatic Neck:  No JVD.  Lungs: Clear bilaterally to auscultation and percussion. Heart: HRRR . 3/6 systolic murmur  Abdomen: Bowel sounds present, umbilical hernia Msk:  Back normal, gait not assessed.  Extremities: No edema or clubbing Neuro: Alert and oriented X 3. Psych:  Good affect, responds appropriately   LABS: Basic Metabolic Panel: Recent Labs    05/23/18 0558 05/25/18 0424  NA 139 139  K 3.6 3.8  CL 102 101  CO2 29 29  GLUCOSE 155* 178*  BUN 35* 22  CREATININE 0.67 0.79  CALCIUM 9.0 8.4*   Liver Function Tests: No results for input(s): AST, ALT, ALKPHOS, BILITOT, PROT, ALBUMIN in the last 72 hours. No results for input(s): LIPASE, AMYLASE in the last 72 hours. CBC: Recent Labs    05/23/18 0558 05/25/18 0424  WBC 8.3 7.5  HGB 14.0 13.0  HCT 42.8 39.9  MCV 80.6 81.0  PLT 106* 90*   Cardiac Enzymes: Recent Labs    05/22/18 1515  TROPONINI 0.08*   BNP: Invalid input(s): POCBNP D-Dimer: No results for input(s): DDIMER in the last 72 hours. Hemoglobin A1C: No results for input(s): HGBA1C in the last 72 hours. Fasting Lipid Panel: No results for input(s): CHOL, HDL, LDLCALC, TRIG, CHOLHDL, LDLDIRECT in the last 72 hours. Thyroid Function Tests: No results for  input(s): TSH, T4TOTAL, T3FREE, THYROIDAB in the last 72 hours.  Invalid input(s): FREET3 Anemia Panel: No results for input(s): VITAMINB12, FOLATE, FERRITIN, TIBC, IRON, RETICCTPCT in the last 72 hours.  No results found.   Echo EF 25-30% per echo 2018; moderate to severe AS  TELEMETRY: Sinus rhythm, 80 bpm  ASSESSMENT AND PLAN:  Active Problems:   Acute respiratory failure with hypoxia (HCC)   Non-intractable vomiting with nausea    1. Acute on chronic systolic CHF, overall improved with diuresis. 2. Moderate to severe aortic stenosis 3. Borderline elevated troponin, likely secondary to demand supply ischemia, without chest pain, not likely ACS 4. Paroxysmal SVT, refractory to adenosine, started on amiodarone, converted to sinus rhythm  Recommendations: 1. Agree with overall therapy 2. Defer starting irbesartan with history of moderate to severe aortic stenosis 3. Decrease metoprolol tartrate to 25 mg BID due to bradycardia 4. Continue reduced dose of amiodarone 200 mg once daily 5. No further cardiac diagnostics recommended at this time. 6. Recommend ambulating today 7. Recommend follow-up with Dr. Saralyn Pilar as outpatient in 1 week  Clabe Seal, PA-C 05/25/2018 1:16 PM

## 2018-05-25 NOTE — Progress Notes (Signed)
PT Cancellation Note  Patient Details Name: ALLETTA MATTOS MRN: [id number] DOB: [birth year]   Cancelled Treatment:    Reason Eval/Treat Not Completed: Other (comment). Per chart review, pt transferred to CCU on 9/7 secondary to increased HR to 180s-190s and since has transferred back to 2A. Due to change in status, will need new PT orders to resume care. Please re-order if PT is indicated. Will complete current order at this time.   Donne Baley 05/25/2018, 10:01 AM  Greggory Stallion, PT, DPT [phone]

## 2018-05-25 NOTE — Anesthesia Postprocedure Evaluation (Signed)
Anesthesia Post Note  Patient: Jessica Blackwell  Procedure(s) Performed: ESOPHAGOGASTRODUODENOSCOPY (EGD) (N/A )  Patient location during evaluation: PACU Anesthesia Type: General Level of consciousness: awake and alert Pain management: pain level controlled Vital Signs Assessment: post-procedure vital signs reviewed and stable Respiratory status: spontaneous breathing, nonlabored ventilation, respiratory function stable and patient connected to nasal cannula oxygen Cardiovascular status: blood pressure returned to baseline and stable Postop Assessment: no apparent nausea or vomiting Anesthetic complications: no     Last Vitals:  Vitals:   05/25/18 0317 05/25/18 0756  BP: (!) 142/63 137/66  Pulse: 68 63  Resp: 18   Temp: 36.6 C 36.6 C  SpO2: 95% 96%    Last Pain:  Vitals:   05/25/18 0831  TempSrc:   PainSc: 0-No pain                 Molli Barrows

## 2018-05-25 NOTE — Progress Notes (Signed)
lovenox dose increased to 40mg  daily due to a crcl >37ml/min and TBW >45kg  Jessica Blackwell D Elyon Zoll, Pharm.D, BCPS Clinical Pharmacist

## 2018-05-25 NOTE — Progress Notes (Signed)
Pt ambulated around bed to chair with walker on room air O2 sats remaining 88-92% pt had no complaints of dizziness however she was anxious and displayed difficulty maneuvering in small area of room.

## 2018-05-26 LAB — BASIC METABOLIC PANEL
Anion gap: 9 (ref 5–15)
BUN: 20 mg/dL (ref 8–23)
CALCIUM: 8.6 mg/dL — AB (ref 8.9–10.3)
CHLORIDE: 99 mmol/L (ref 98–111)
CO2: 31 mmol/L (ref 22–32)
CREATININE: 0.73 mg/dL (ref 0.44–1.00)
GFR calc Af Amer: >60 mL/min (ref >60)
Glucose, Bld: 167 mg/dL — ABNORMAL HIGH (ref 70–99)
Potassium: 3.6 mmol/L (ref 3.5–5.1)
SODIUM: 139 mmol/L (ref 135–145)

## 2018-05-26 LAB — CBC
HCT: 40.8 % (ref 35.0–47.0)
HEMOGLOBIN: 13.3 g/dL (ref 12.0–16.0)
MCH: 26.3 pg (ref 26.0–34.0)
MCHC: 32.5 g/dL (ref 32.0–36.0)
MCV: 80.9 fL (ref 80.0–100.0)
Platelets: 101 10*3/uL — ABNORMAL LOW (ref 150–440)
RBC: 5.05 MIL/uL (ref 3.80–5.20)
RDW: 14.7 % — AB (ref 11.5–14.5)
WBC: 7.9 10*3/uL (ref 3.6–11.0)

## 2018-05-26 LAB — GLUCOSE, CAPILLARY
GLUCOSE-CAPILLARY: 169 mg/dL — AB (ref 70–99)
Glucose-Capillary: 175 mg/dL — ABNORMAL HIGH (ref 70–99)

## 2018-05-26 MED ORDER — SACCHAROMYCES BOULARDII 250 MG PO CAPS
250.0000 mg | ORAL_CAPSULE | Freq: Two times a day (BID) | ORAL | Status: DC
  Filled 2018-05-26 (×2): qty 1

## 2018-05-26 MED ORDER — CEFDINIR 300 MG PO CAPS
300.0000 mg | ORAL_CAPSULE | Freq: Two times a day (BID) | ORAL | Status: DC
  Filled 2018-05-26 (×2): qty 1

## 2018-05-26 MED ORDER — AMIODARONE HCL 200 MG PO TABS: 200.0000 mg | ORAL_TABLET | Freq: Every day | ORAL | Status: DC

## 2018-05-26 MED ORDER — MUPIROCIN 2 % EX OINT: 1.0000 "application " | TOPICAL_OINTMENT | Freq: Two times a day (BID) | CUTANEOUS | 0 refills | Status: AC

## 2018-05-26 MED ORDER — HYDROXYZINE HCL 25 MG PO TABS: 25.0000 mg | ORAL_TABLET | Freq: Three times a day (TID) | ORAL | 0 refills | Status: DC | PRN

## 2018-05-26 MED ORDER — ACETAMINOPHEN 325 MG PO TABS: 650.0000 mg | ORAL_TABLET | Freq: Four times a day (QID) | ORAL | Status: AC | PRN

## 2018-05-26 MED ORDER — SACCHAROMYCES BOULARDII 250 MG PO CAPS: 250.0000 mg | ORAL_CAPSULE | Freq: Two times a day (BID) | ORAL | 0 refills | Status: DC

## 2018-05-26 MED ORDER — INSULIN ASPART 100 UNIT/ML ~~LOC~~ SOLN: 0.0000 [IU] | Freq: Three times a day (TID) | SUBCUTANEOUS | 11 refills | Status: DC

## 2018-05-26 MED ORDER — CEFDINIR 300 MG PO CAPS: 300.0000 mg | ORAL_CAPSULE | Freq: Two times a day (BID) | ORAL | 0 refills | Status: DC

## 2018-05-26 MED ORDER — ONDANSETRON HCL 4 MG PO TABS: 4.0000 mg | ORAL_TABLET | Freq: Four times a day (QID) | ORAL | 0 refills | Status: DC | PRN

## 2018-05-26 MED ORDER — INSULIN ASPART 100 UNIT/ML ~~LOC~~ SOLN: 0.0000 [IU] | Freq: Every day | SUBCUTANEOUS | 11 refills | Status: DC

## 2018-05-26 MED ORDER — METOPROLOL TARTRATE 25 MG PO TABS: 25.0000 mg | ORAL_TABLET | Freq: Two times a day (BID) | ORAL | Status: DC

## 2018-05-26 NOTE — Progress Notes (Signed)
New referral for outpatient Palliative to follow at [facility] received from [facility]. Patient information faxed to referral. Patient to discharge today. Flo Shanks RN, BSN, [facility] [place], hospital liaison [phone]

## 2018-05-26 NOTE — Discharge Instructions (Addendum)
Follow-up with primary care physician at the facility in 2 to 3 days.  Primary care physician to follow-up on the pending urine culture and sensitivity and modify the antibiotics as needed Follow-up with [facility] cardiology Dr. Saralyn Pilar in 1 week      Fall Prevention in the Home Falls can cause injuries. They can happen to people of all ages. There are many things you can do to make your home safe and to help prevent falls. What can I do on the outside of my home?  Regularly fix the edges of walkways and driveways and fix any cracks.  Remove anything that might make you trip as you walk through a door, such as a raised step or threshold.  Trim any bushes or trees on the path to your home.  Use bright outdoor lighting.  Clear any walking paths of anything that might make someone trip, such as rocks or tools.  Regularly check to see if handrails are loose or broken. Make sure that both sides of any steps have handrails.  Any raised decks and porches should have guardrails on the edges.  Have any leaves, snow, or ice cleared regularly.  Use sand or salt on walking paths during winter.  Clean up any spills in your garage right away. This includes oil or grease spills. What can I do in the bathroom?  Use night lights.  Install grab bars by the toilet and in the tub and shower. Do not use towel bars as grab bars.  Use non-skid mats or decals in the tub or shower.  If you need to sit down in the shower, use a plastic, non-slip stool.  Keep the floor dry. Clean up any water that spills on the floor as soon as it happens.  Remove soap buildup in the tub or shower regularly.  Attach bath mats securely with double-sided non-slip rug tape.  Do not have throw rugs and other things on the floor that can make you trip. What can I do in the bedroom?  Use night lights.  Make sure that you have a light by your bed that is easy to reach.  Do not use any sheets or blankets that are too  big for your bed. They should not hang down onto the floor.  Have a firm chair that has side arms. You can use this for support while you get dressed.  Do not have throw rugs and other things on the floor that can make you trip. What can I do in the kitchen?  Clean up any spills right away.  Avoid walking on wet floors.  Keep items that you use a lot in easy-to-reach places.  If you need to reach something above you, use a strong step stool that has a grab bar.  Keep electrical cords out of the way.  Do not use floor polish or wax that makes floors slippery. If you must use wax, use non-skid floor wax.  Do not have throw rugs and other things on the floor that can make you trip. What can I do with my stairs?  Do not leave any items on the stairs.  Make sure that there are handrails on both sides of the stairs and use them. Fix handrails that are broken or loose. Make sure that handrails are as long as the stairways.  Check any carpeting to make sure that it is firmly attached to the stairs. Fix any carpet that is loose or worn.  Avoid having throw rugs  at the top or bottom of the stairs. If you do have throw rugs, attach them to the floor with carpet tape.  Make sure that you have a light switch at the top of the stairs and the bottom of the stairs. If you do not have them, ask someone to add them for you. What else can I do to help prevent falls?  Wear shoes that: ? Do not have high heels. ? Have rubber bottoms. ? Are comfortable and fit you well. ? Are closed at the toe. Do not wear sandals.  If you use a stepladder: ? Make sure that it is fully opened. Do not climb a closed stepladder. ? Make sure that both sides of the stepladder are locked into place. ? Ask someone to hold it for you, if possible.  Clearly mark and make sure that you can see: ? Any grab bars or handrails. ? First and last steps. ? Where the edge of each step is.  Use tools that help you move  around (mobility aids) if they are needed. These include: ? Canes. ? Walkers. ? Scooters. ? Crutches.  Turn on the lights when you go into a dark area. Replace any light bulbs as soon as they burn out.  Set up your furniture so you have a clear path. Avoid moving your furniture around.  If any of your floors are uneven, fix them.  If there are any pets around you, be aware of where they are.  Review your medicines with your doctor. Some medicines can make you feel dizzy. This can increase your chance of falling. Ask your doctor what other things that you can do to help prevent falls. This information is not intended to replace advice given to you by your health care provider. Make sure you discuss any questions you have with your health care provider. Document Released: 06/29/2009 Document Revised: 02/08/2016 Document Reviewed: 10/07/2014 Elsevier Interactive Patient Education  [employer].

## 2018-05-26 NOTE — NC FL2 (Signed)
[place] LEVEL OF CARE SCREENING TOOL     IDENTIFICATION  Patient Name: Jessica Blackwell Birthdate: 1924-10-11 Sex: female Admission Date (Current Location): 05/20/2018  [place] and [place] Number:  Engineering geologist and Address:  [facility], [address] [place], [place] [address]      Provider Number: [id number]  Attending Physician Name and Address:  Nicholes Mango, MD  Relative Name and Phone Number:  Dicky Doe   [phone] or Winifred Olive Daughter [phone]  or Holcomb,Jennifer Granddaughter   [phone] or Roxine Caddy    [phone]     Current Level of Care: Hospital Recommended Level of Care: [place] Prior Approval Number:    Date Approved/Denied:   PASRR Number: [id number]  Discharge Plan: SNF    Current Diagnoses: Patient Active Problem List   Diagnosis Date Noted  . Non-intractable vomiting with nausea   . LOC (loss of consciousness) ([place]) 12/22/2016  . AKI (acute kidney injury) ([place]) 08/01/2015  . V-tach ([place]) 08/01/2015  . NSVT (nonsustained ventricular tachycardia) ([place])   . Persistent atrial fibrillation ([employer])   . Acute systolic congestive heart failure ([place]) 07/31/2015  . Acute respiratory failure with hypoxia ([place]) 07/28/2015  . Fall at home 07/28/2015  . GERD (gastroesophageal reflux disease) 07/28/2015  . HTN (hypertension) 07/28/2015  . Anemia 07/28/2015    Orientation RESPIRATION BLADDER Height & Weight     Self, Time, Situation, Place  Normal Continent Weight: 134 lb 9.6 oz (61.1 kg) Height:  5\' 1"  (154.9 cm)  BEHAVIORAL SYMPTOMS/MOOD NEUROLOGICAL BOWEL NUTRITION STATUS      Continent Diet(Carb modified)  AMBULATORY STATUS COMMUNICATION OF NEEDS Skin   Limited Assist Verbally Normal                       Personal Care Assistance Level of Assistance  Bathing, Feeding, Dressing Bathing Assistance: Limited assistance Feeding assistance:  Independent Dressing Assistance: Limited assistance     Functional Limitations Info  Speech, Hearing, Sight Sight Info: Adequate Hearing Info: Adequate Speech Info: Adequate    SPECIAL CARE FACTORS FREQUENCY  PT (By licensed PT)     PT Frequency: 5x a week              Contractures Contractures Info: Not present    Additional Factors Info  Code Status, Allergies Code Status Info: Full code Allergies Info: CODEINE, CIPROFLOXACIN, SULFAMETHOXAZOLE-TRIMETHOPRIM            Current Medications (05/26/2018):  This is the current hospital active medication list Current Facility-Administered Medications  Medication Dose Route Frequency Provider Last Rate Last Dose  . acetaminophen (TYLENOL) tablet 650 mg  650 mg Oral Q6H PRN Loletha Grayer, MD   650 mg at 05/23/18 1108   Or  . acetaminophen (TYLENOL) suppository 650 mg  650 mg Rectal Q6H PRN Loletha Grayer, MD      . adenosine (ADENOCARD) 6 MG/2ML injection 6 mg  6 mg Intravenous Once Paraschos, Alexander, MD      . amiodarone (PACERONE) tablet 200 mg  200 mg Oral Daily Paraschos, Alexander, MD   200 mg at 05/26/18 0946  . aspirin EC tablet 81 mg  81 mg Oral Daily Loletha Grayer, MD   81 mg at 05/26/18 0945  . Chlorhexidine Gluconate Cloth 2 % PADS 6 each  6 each Topical Q0600 Cassandria Santee, MD   6 each at 05/25/18 [phone]  . enoxaparin (LOVENOX) injection 40 mg  40 mg Subcutaneous Q24H Maccia, Melissa  D, RPH   40 mg at 05/25/18 2153  . furosemide (LASIX) tablet 40 mg  40 mg Oral BID Nicholes Mango, MD   40 mg at 05/26/18 0853  . gabapentin (NEURONTIN) capsule 300 mg  300 mg Oral Daily Loletha Grayer, MD   300 mg at 05/26/18 0945  . hydrOXYzine (ATARAX/VISTARIL) tablet 25 mg  25 mg Oral TID PRN Lance Coon, MD   25 mg at 05/20/18 2226  . insulin aspart (novoLOG) injection 0-5 Units  0-5 Units Subcutaneous QHS Mayo, Pete Pelt, MD      . insulin aspart (novoLOG) injection 0-9 Units  0-9 Units Subcutaneous TID WC Mayo, Pete Pelt, MD   2 Units at 05/26/18 0946  . metoprolol tartrate (LOPRESSOR) injection 5 mg  5 mg Intravenous Q4H PRN Gouru, Aruna, MD      . metoprolol tartrate (LOPRESSOR) tablet 25 mg  25 mg Oral BID Clabe Seal, PA-C   25 mg at 05/26/18 0945  . mupirocin ointment (BACTROBAN) 2 % 1 application  1 application Nasal BID Cassandria Santee, MD   1 application at 26/37/85 0944  . ondansetron (ZOFRAN) tablet 4 mg  4 mg Oral Q6H PRN Loletha Grayer, MD       Or  . ondansetron [facility]) injection 4 mg  4 mg Intravenous Q6H PRN Loletha Grayer, MD   4 mg at 05/21/18 0435  . pantoprazole (PROTONIX) EC tablet 40 mg  40 mg Oral BID AC Gouru, Aruna, MD   40 mg at 05/26/18 0853  . prochlorperazine (COMPAZINE) injection 5 mg  5 mg Intravenous Q4H PRN Lance Coon, MD   5 mg at 05/21/18 0125  . promethazine (PHENERGAN) injection 12.5 mg  12.5 mg Intravenous Q6H PRN Mayo, Pete Pelt, MD   12.5 mg at 05/21/18 1030  . sodium chloride flush (NS) 0.9 % injection 3 mL  3 mL Intravenous Q12H Mayo, Pete Pelt, MD   3 mL at 05/26/18 0944  . spironolactone (ALDACTONE) tablet 12.5 mg  12.5 mg Oral Daily Loletha Grayer, MD   12.5 mg at 05/26/18 0945     Discharge Medications: Please see discharge summary for a list of discharge medications.  Relevant Imaging Results:  Relevant Lab Results:   Additional Information SSN [id number]  Ross Ludwig, [place]

## 2018-05-26 NOTE — Progress Notes (Signed)
PT Cancellation Note  Patient Details Name: Jessica Blackwell MRN: [id number] DOB: 08/23/1925   Cancelled Treatment:    Reason Eval/Treat Not Completed: Other (comment). Received call to re-evaluation for patient. Upon entering room, pt reports she just got back in the bed from a bath and is very tired, wishes to rest at this time. Refusing PT at this time. Discussed with daughter in room along with MD, CM, and CSW. Pt is to discharge back to SNF this date. If discharge cancels, will re-attempt tomorrow.   Calyb Mcquarrie 05/26/2018, 2:36 PM  Greggory Stallion, PT, DPT [phone]

## 2018-05-26 NOTE — Discharge Summary (Addendum)
[facility] NAME: Jessica Blackwell    MR#:  [id number]  DATE OF BIRTH:  31-Mar-1925  DATE OF ADMISSION:  05/20/2018 ADMITTING PHYSICIAN: Loletha Grayer, MD  DATE OF DISCHARGE:  05/26/18  PRIMARY CARE PHYSICIAN: Idelle Crouch, MD    ADMISSION DIAGNOSIS:  Hypoxia [R09.02] Fall, initial encounter [phone].[id number]]  DISCHARGE DIAGNOSIS:  Active Problems:   Acute respiratory failure with hypoxia (HCC)   Non-intractable vomiting with nausea psvt  SECONDARY DIAGNOSIS:   Past Medical History:  Diagnosis Date  . Arthritis   . CHF (congestive heart failure) ([place])   . Fall at home 07/28/2015  . GERD (gastroesophageal reflux disease)   . H/O bladder infections   . Hypertension   . Neuropathy    lower extrmities    HOSPITAL COURSE:   HPI  Jessica Blackwell  is a 84 y.o. female with a known history of congestive heart failure and hypertension neuropathy presents with a fall at home.  She was getting ready to eat breakfast she walked with her walker and tried to sit down and before she knew it she ended up on the ground.  She came to the ER to get checked out.  She has a left arm skin tear.  She was found to have a pulse ox of 86% on room air and was placed on oxygen.  Hospitalist services were contacted for further evaluation.  She has been feeling weak for quite a while.  She cut her gabapentin down to once a day.   Paroxysmal SVT Currently in sinus rhythm Metoprolol dose decreased to 25 mg twice daily secondary to bradycardia and continue decreased amiodarone to 200 mg daily [facility] f/u   Acute hypoxic respiratory failure- due to acute on chronic systolic CHF. Last ECHO with EF 25-30%.weaned off O2  Wean off oxygen as tolerated, overnight pulse ox Renal function is back to normal,  resumed Lasix  - ECHO with moderate LVH and moderate to severe stenosis, no comment on EF. - cardiology Pence op f/u following - continue  metoprolol and spironolactone -Discontinued irbesartan given the history of moderate to severe aortic stenosis - duonebs prn and incentive spirometry - wean O2 as able Outpatient follow-up with cardiology Dr. Saralyn Pilar in a week  Abnormal urinalysis patient is a symptomatic  urine culture ordered not considering antibiotics at this time  Gastric outlet obstruction- seen on abdominal x-ray on 9/5. Nausea and vomiting has resolved with NG tube (placed 9/5). - CT abdomen/pelvis unremarkable -EGD 05/22/2018 was normal - GI Dr. Alice Reichert following-diet advanced to cardiac, patient is tolerating fine, op gi f/u in 2 weeks - continue zofran and phenergan prn  Acute kidney injury- Cr increased from 0.64 > 1.13--0.67 --0.73  .-Resolved. -Resume Lasix - hold irbesartan - encouraged po intake  Elevated troponin- troponins 0.04, 0.07, 0.12, 0.08. Likely demand ischemia in the setting of CHF exacerbation. No active chest pain. -No cardiac interventions needed at this time  Neuropathy- stable - continue gabapentin  GERD- stable - continue protonix  Generalized weakness/deconditioning- likely due to age - PT consult-recommending skilled nursing facility Patient prefers going home granddaughter also agreeable with home health but daughter prefers taking her to rehab at [facility] diagnosis of T2DM- A1c 7.2% this admission - sensitive SSI - needs to f/u with PCP  Chronic left mid lung mass- stable, seen on previous CT - monitor as outpatient  Disposition- snf -[facility]   Plan of care discussed  with the patient, daughter Jessica Blackwell  at bedside.  They both verbalized understanding of the plan.  Disposition [facility] skilled nursing facility today  DISCHARGE CONDITIONS:   Stable   CONSULTS OBTAINED:  Treatment Team:  Isaias Cowman, MD   PROCEDURES   DRUG ALLERGIES:   Allergies  Allergen Reactions  . Codeine Other (See Comments)    Upset stomach  .  Ciprofloxacin Itching and Rash  . Sulfamethoxazole-Trimethoprim Rash    DISCHARGE MEDICATIONS:   Allergies as of 05/26/2018      Reactions   Codeine Other (See Comments)   Upset stomach   Ciprofloxacin Itching, Rash   Sulfamethoxazole-trimethoprim Rash      Medication List    STOP taking these medications   metoprolol succinate 50 MG 24 hr tablet Commonly known as:  TOPROL-XL     TAKE these medications   acetaminophen 325 MG tablet Commonly known as:  TYLENOL Take 2 tablets (650 mg total) by mouth every 6 (six) hours as needed for mild pain (or Fever >/= 101).   amiodarone 200 MG tablet Commonly known as:  PACERONE Take 1 tablet (200 mg total) by mouth daily. Start taking on:  05/27/2018   aspirin EC 81 MG tablet Take 81 mg by mouth daily.   cefdinir 300 MG capsule Commonly known as:  OMNICEF Take 1 capsule (300 mg total) by mouth every 12 (twelve) hours.   furosemide 20 MG tablet Commonly known as:  LASIX Take 2 tablets (40 mg total) by mouth 2 (two) times daily.   gabapentin 300 MG capsule Commonly known as:  NEURONTIN Take 300 mg by mouth 3 (three) times daily.   hydrOXYzine 25 MG tablet Commonly known as:  ATARAX/VISTARIL Take 1 tablet (25 mg total) by mouth 3 (three) times daily as needed for anxiety.   insulin aspart 100 UNIT/ML injection Commonly known as:  novoLOG Inject 0-9 Units into the skin 3 (three) times daily with meals. CBG < 70: implement hypoglycemia protocol CBG 70 - 120: 0 units CBG 121 - 150: 1 unit CBG 151 - 200: 2 units CBG 201 - 250: 3 units CBG 251 - 300: 5 units CBG 301 - 350: 7 units CBG 351 - 400: 9 units CBG > 400: call MD and obtain STAT lab verification   insulin aspart 100 UNIT/ML injection Commonly known as:  novoLOG Inject 0-5 Units into the skin at bedtime. CBG < 70: implement hypoglycemia protocol CBG 70 - 120: 0 units CBG 121 - 150: 0 units CBG 151 - 200: 0 units CBG 201 - 250: 2 units CBG 251 - 300: 3 units CBG  301 - 350: 4 units CBG 351 - 400: 5 units CBG > 400: call MD and obtain STAT lab verification   metoprolol tartrate 25 MG tablet Commonly known as:  LOPRESSOR Take 1 tablet (25 mg total) by mouth 2 (two) times daily.   mupirocin ointment 2 % Commonly known as:  BACTROBAN Place 1 application into the nose 2 (two) times daily for 3 days.   ondansetron 4 MG tablet Commonly known as:  ZOFRAN Take 1 tablet (4 mg total) by mouth every 6 (six) hours as needed for nausea.   pantoprazole 40 MG tablet Commonly known as:  PROTONIX Take 40 mg by mouth daily.   saccharomyces boulardii 250 MG capsule Commonly known as:  FLORASTOR Take 1 capsule (250 mg total) by mouth 2 (two) times daily.   spironolactone 25 MG tablet Commonly known as:  ALDACTONE Take 12.5  mg by mouth daily.        DISCHARGE INSTRUCTIONS:  Follow-up with primary care physician at the facility in 2 to 3 days.  Primary care physician to follow-up on the pending urine culture and sensitivity and modify the antibiotics as needed Follow-[facility] with [facility] cardiology Dr. Saralyn Pilar in 1 week Follow-up with gastroenterology Dr. Alice Reichert in 2 weeks Patient needs outpatient palliative care follow-up in 5 to 7 days   DIET:  Cardiac diet and Diabetic diet  DISCHARGE CONDITION:  Stable  ACTIVITY:  Activity as tolerated  OXYGEN:  Home Oxygen: No.   Oxygen Delivery: room air  DISCHARGE LOCATION:  nursing home   If you experience worsening of your admission symptoms, develop shortness of breath, life threatening emergency, suicidal or homicidal thoughts you must seek medical attention immediately by calling 911 or calling your MD immediately  if symptoms less severe.  You Must read complete instructions/literature along with all the possible adverse reactions/side effects for all the Medicines you take and that have been prescribed to you. Take any new Medicines after you have completely understood and accpet all the possible  adverse reactions/side effects.   Please note  You were cared for by a hospitalist during your hospital stay. If you have any questions about your discharge medications or the care you received while you were in the hospital after you are discharged, you can call the unit and asked to speak with the hospitalist on call if the hospitalist that took care of you is not available. Once you are discharged, your primary care physician will handle any further medical issues. Please note that NO REFILLS for any discharge medications will be authorized once you are discharged, as it is imperative that you return to your primary care physician (or establish a relationship with a primary care physician if you do not have one) for your aftercare needs so that they can reassess your need for medications and monitor your lab values.     Today  Chief Complaint  Patient presents with  . Fall   Patient is feeling much better.  Denies any complaints.  Okay to discharge patient from cardiology and GI standpoint  ROS:  CONSTITUTIONAL: Denies fevers, chills. Denies any fatigue, weakness.  EYES: Denies blurry vision, double vision, eye pain. EARS, NOSE, THROAT: Denies tinnitus, ear pain, hearing loss. RESPIRATORY: Denies cough, wheeze, shortness of breath.  CARDIOVASCULAR: Denies chest pain, palpitations, edema.  GASTROINTESTINAL: Denies nausea, vomiting, diarrhea, abdominal pain. Denies bright red blood per rectum. GENITOURINARY: Denies dysuria, hematuria. ENDOCRINE: Denies nocturia or thyroid problems. HEMATOLOGIC AND LYMPHATIC: Denies easy bruising or bleeding. SKIN: Denies rash or lesion. MUSCULOSKELETAL: Denies pain in neck, back, shoulder, knees, hips or arthritic symptoms.  NEUROLOGIC: Denies paralysis, paresthesias.  PSYCHIATRIC: Denies anxiety or depressive symptoms.   VITAL SIGNS:  Blood pressure 140/64, pulse 64, temperature (!) 97.4 F (36.3 C), temperature source Oral, resp. rate 18, height  5\' 1"  (1.549 m), weight 61.1 kg, SpO2 94 %.  I/O:    Intake/Output Summary (Last 24 hours) at 05/26/2018 1415 Last data filed at 05/25/2018 2100 Gross per 24 hour  Intake 0 ml  Output 550 ml  Net -550 ml    PHYSICAL EXAMINATION:  GENERAL:  84 y.o.-year-old patient lying in the bed with no acute distress.  EYES: Pupils equal, round, reactive to light and accommodation. No scleral icterus. Extraocular muscles intact.  HEENT: Head atraumatic, normocephalic. Oropharynx and nasopharynx clear.  NECK:  Supple, no jugular venous distention. No thyroid  enlargement, no tenderness.  LUNGS: Normal breath sounds bilaterally, no wheezing, rales,rhonchi or crepitation. No use of accessory muscles of respiration.  CARDIOVASCULAR: S1, S2 normal. No murmurs, rubs, or gallops.  ABDOMEN: Soft, non-tender, non-distended. Bowel sounds present. No organomegaly or mass.  EXTREMITIES: No pedal edema, cyanosis, or clubbing.  NEUROLOGIC: Cranial nerves II through XII are intact. Muscle strength 5/5 in all extremities. Sensation intact. Gait not checked.  PSYCHIATRIC: The patient is alert and oriented x 3.  SKIN: No obvious rash, lesion, or ulcer.   DATA REVIEW:   CBC Recent Labs  Lab 05/26/18 0314  WBC 7.9  HGB 13.3  HCT 40.8  PLT 101*    Chemistries  Recent Labs  Lab 05/22/18 0324  05/26/18 0314  NA 142   < > 139  K 3.5   < > 3.6  CL 102   < > 99  CO2 29   < > 31  GLUCOSE 164*   < > 167*  BUN 30*   < > 20  CREATININE 1.13*   < > 0.73  CALCIUM 9.5   < > 8.6*  AST 12*  --   --   ALT 12  --   --   ALKPHOS 50  --   --   BILITOT 1.5*  --   --    < > = values in this interval not displayed.    Cardiac Enzymes Recent Labs  Lab 05/22/18 1515  TROPONINI 0.08*    Microbiology Results  Results for orders placed or performed during the hospital encounter of 05/20/18  MRSA PCR Screening     Status: Abnormal   Collection Time: 05/23/18 10:32 AM  Result Value Ref Range Status   MRSA by PCR  POSITIVE (A) NEGATIVE Final    Comment:        The GeneXpert MRSA Assay (FDA approved for NASAL specimens only), is one component of a comprehensive MRSA colonization surveillance program. It is not intended to diagnose MRSA infection nor to guide or monitor treatment for MRSA infections. RESULT CALLED TO, READ BACK BY AND VERIFIED WITH: [facility] 05/23/18 @ 1212  [place] Performed at [facility], [address] [place], [place] [address]   Urine Culture     Status: Abnormal (Preliminary result)   Collection Time: 05/24/18  6:00 PM  Result Value Ref Range Status   Specimen Description   Final    URINE, CLEAN CATCH Performed at [facility], [address] [place], [place] [address]    Special Requests   Final    Normal Performed at [facility], [address] [place], [place] [address]    Culture (A)  Final    >=100,000 COLONIES/mL KLEBSIELLA PNEUMONIAE SUSCEPTIBILITIES TO FOLLOW Performed at [facility], [place] [address], [place], [place] [address]    Report Status PENDING  Incomplete    RADIOLOGY:  No results found.  EKG:   Orders placed or performed during the hospital encounter of 05/20/18  . EKG 12-Lead  . EKG 12-Lead  . EKG 12-Lead  . EKG 12-Lead  . EKG 12-Lead  . EKG 12-Lead  . EKG 12-Lead  . EKG 12-Lead  . EKG 12-Lead  . EKG 12-Lead      Management plans discussed with the patient, family and they are in agreement.  CODE STATUS:     Code Status Orders  (From admission, onward)         Start     Ordered  05/21/18 2004  Full code  Continuous     05/21/18 2003        Code Status History    Date Active Date Inactive Code Status Order ID Comments User Context   05/20/2018 1418 05/21/2018 2003 DNR [id number]  Loletha Grayer, MD ED   12/22/2016 2052 12/23/2016 1512 Full Code [id number]  Quintella Baton, MD Inpatient   07/28/2015 0638 08/02/2015 1844 Full Code [id number]  Norval Morton, MD Inpatient    Advance  Directive Documentation     Most Recent Value  Type of Advance Directive  Healthcare Power of Attorney  Pre-existing out of facility DNR order (yellow form or pink MOST form)  -  "MOST" Form in Place?  -      TOTAL TIME TAKING CARE OF THIS PATIENT: 43  minutes.   Note: This dictation was prepared with Dragon dictation along with smaller phrase technology. Any transcriptional errors that result from this process are unintentional.   @[facility] @  on 05/26/2018 at 2:15 PM  Between 7am to 6pm - Pager - [phone]  After 6pm go to [email or web address] - password [email or web address] [facility]  Office  [phone]  CC: Primary care physician; Idelle Crouch, MD

## 2018-05-26 NOTE — Progress Notes (Signed)
Called facility for nurse-to-nurse report at this time. All questions answered. [facility] for non-emergent patient transport to the facility at this time. Will continue to monitor until departure. Wenda Low [facility]

## 2018-05-26 NOTE — Clinical Social Work Note (Addendum)
CSW was informed that patient would like to go to SNF for short term rehab, patient has been faxed out awaiting bed offers for patient.  CSW presented bed offers to patient and she chose [facility].  CSW spoke to [facility], and they can accept patient once she is medically ready for discharge and orders have been received.  Formal assessment to follow, CSW to continue to facilitate discharge planning.  2:55pm  Patient to be d/c'ed today to [facility].  Patient and family agreeable to plans will transport via ems RN to call report [phone].  Daughter Otho Perl was at bedside and is aware that patient is discharging to [facility] today.    Jones Broom. Syaire Saber, MSW, [place]  05/26/2018 10:40 AM

## 2018-05-26 NOTE — Care Management (Signed)
Patient informed CM that she wishes to go to skilled nursing facility as recommended and is able to make her own decisions. Patient says "I can make my own decisions and I know I need to be stronger before I go home."  Discussed outpatient palliative with attending. Notified Craige Cotta

## 2018-05-26 NOTE — Care Management Important Message (Signed)
Important Message  Patient Details  Name: Jessica Blackwell MRN: [id number] Date of Birth: 29-Apr-1925   Medicare Important Message Given:  Yes Notice was given approximately 10AM  /05/2018. CM forgot to document   Katrina Stack, RN 05/26/2018, 6:17 PM

## 2018-05-27 LAB — URINE CULTURE: Special Requests: NORMAL

## 2018-06-12 ENCOUNTER — Ambulatory Visit: Payer: Self-pay | Admitting: Urology

## 2018-07-02 ENCOUNTER — Ambulatory Visit: Payer: Self-pay | Admitting: Urology

## 2019-03-26 ENCOUNTER — Inpatient Hospital Stay
Admission: EM | Admit: 2019-03-26 | Discharge: 2019-03-28 | DRG: 291 | Disposition: A | Discharge status: Home / Self Care | Payer: Medicare Other | Attending: Internal Medicine | Admitting: Internal Medicine

## 2019-03-26 ENCOUNTER — Other Ambulatory Visit: Payer: Self-pay

## 2019-03-26 ENCOUNTER — Emergency Department: Payer: Medicare Other

## 2019-03-26 DIAGNOSIS — Z794 Long term (current) use of insulin: Secondary | ICD-10-CM | POA: Diagnosis not present

## 2019-03-26 DIAGNOSIS — I472 Ventricular tachycardia: Secondary | ICD-10-CM | POA: Diagnosis present

## 2019-03-26 DIAGNOSIS — Z66 Do not resuscitate: Secondary | ICD-10-CM | POA: Diagnosis present

## 2019-03-26 DIAGNOSIS — J9601 Acute respiratory failure with hypoxia: Secondary | ICD-10-CM | POA: Diagnosis present

## 2019-03-26 DIAGNOSIS — I5031 Acute diastolic (congestive) heart failure: Secondary | ICD-10-CM

## 2019-03-26 DIAGNOSIS — I350 Nonrheumatic aortic (valve) stenosis: Secondary | ICD-10-CM | POA: Diagnosis present

## 2019-03-26 DIAGNOSIS — Z8249 Family history of ischemic heart disease and other diseases of the circulatory system: Secondary | ICD-10-CM

## 2019-03-26 DIAGNOSIS — Z1159 Encounter for screening for other viral diseases: Secondary | ICD-10-CM

## 2019-03-26 DIAGNOSIS — Z79899 Other long term (current) drug therapy: Secondary | ICD-10-CM

## 2019-03-26 DIAGNOSIS — G629 Polyneuropathy, unspecified: Secondary | ICD-10-CM | POA: Diagnosis present

## 2019-03-26 DIAGNOSIS — Z90710 Acquired absence of both cervix and uterus: Secondary | ICD-10-CM | POA: Diagnosis not present

## 2019-03-26 DIAGNOSIS — I480 Paroxysmal atrial fibrillation: Secondary | ICD-10-CM | POA: Diagnosis present

## 2019-03-26 DIAGNOSIS — I110 Hypertensive heart disease with heart failure: Principal | ICD-10-CM | POA: Diagnosis present

## 2019-03-26 DIAGNOSIS — J189 Pneumonia, unspecified organism: Secondary | ICD-10-CM | POA: Diagnosis present

## 2019-03-26 DIAGNOSIS — Z7982 Long term (current) use of aspirin: Secondary | ICD-10-CM | POA: Diagnosis not present

## 2019-03-26 DIAGNOSIS — I5043 Acute on chronic combined systolic (congestive) and diastolic (congestive) heart failure: Secondary | ICD-10-CM | POA: Diagnosis present

## 2019-03-26 DIAGNOSIS — I5023 Acute on chronic systolic (congestive) heart failure: Secondary | ICD-10-CM

## 2019-03-26 DIAGNOSIS — K219 Gastro-esophageal reflux disease without esophagitis: Secondary | ICD-10-CM | POA: Diagnosis present

## 2019-03-26 DIAGNOSIS — I509 Heart failure, unspecified: Secondary | ICD-10-CM

## 2019-03-26 HISTORY — DX: Nonrheumatic aortic (valve) stenosis: I35.0

## 2019-03-26 LAB — TROPONIN I (HIGH SENSITIVITY)
Troponin I (High Sensitivity): 11 ng/L (ref <18)
Troponin I (High Sensitivity): 11 ng/L (ref <18)

## 2019-03-26 LAB — CREATININE, SERUM
Creatinine, Ser: 0.89 mg/dL (ref 0.44–1.00)
GFR calc Af Amer: >60 mL/min (ref >60)
GFR calc non Af Amer: 56 mL/min — ABNORMAL LOW (ref >60)

## 2019-03-26 LAB — SARS CORONAVIRUS 2 BY RT PCR (HOSPITAL ORDER, PERFORMED IN ~~LOC~~ HOSPITAL LAB): SARS Coronavirus 2: NEGATIVE

## 2019-03-26 LAB — CBC WITH DIFFERENTIAL/PLATELET
Abs Immature Granulocytes: 0.01 10*3/uL (ref 0.00–0.07)
Basophils Absolute: 0.1 10*3/uL (ref 0.0–0.1)
Basophils Relative: 2 %
Eosinophils Absolute: 0 10*3/uL (ref 0.0–0.5)
Eosinophils Relative: 1 %
HCT: 48.1 % — ABNORMAL HIGH (ref 36.0–46.0)
Hemoglobin: 14.8 g/dL (ref 12.0–15.0)
Immature Granulocytes: 0 %
Lymphocytes Relative: 30 %
Lymphs Abs: 1.2 10*3/uL (ref 0.7–4.0)
MCH: 26.4 pg (ref 26.0–34.0)
MCHC: 30.8 g/dL (ref 30.0–36.0)
MCV: 85.9 fL (ref 80.0–100.0)
Monocytes Absolute: 0.4 10*3/uL (ref 0.1–1.0)
Monocytes Relative: 9 %
Neutro Abs: 2.3 10*3/uL (ref 1.7–7.7)
Neutrophils Relative %: 58 %
Platelets: 66 10*3/uL — ABNORMAL LOW (ref 150–400)
RBC: 5.6 MIL/uL — ABNORMAL HIGH (ref 3.87–5.11)
RDW: 16 % — ABNORMAL HIGH (ref 11.5–15.5)
WBC: 4 10*3/uL (ref 4.0–10.5)
nRBC: 0 % (ref 0.0–0.2)

## 2019-03-26 LAB — PROCALCITONIN: Procalcitonin: <0.1 ng/mL

## 2019-03-26 LAB — BASIC METABOLIC PANEL
Anion gap: 11 (ref 5–15)
BUN: 14 mg/dL (ref 8–23)
CO2: 29 mmol/L (ref 22–32)
Calcium: 9.3 mg/dL (ref 8.9–10.3)
Chloride: 100 mmol/L (ref 98–111)
Creatinine, Ser: 0.86 mg/dL (ref 0.44–1.00)
GFR calc Af Amer: >60 mL/min (ref >60)
GFR calc non Af Amer: 59 mL/min — ABNORMAL LOW (ref >60)
Glucose, Bld: 109 mg/dL — ABNORMAL HIGH (ref 70–99)
Potassium: 3.8 mmol/L (ref 3.5–5.1)
Sodium: 140 mmol/L (ref 135–145)

## 2019-03-26 LAB — GLUCOSE, CAPILLARY: Glucose-Capillary: 121 mg/dL — ABNORMAL HIGH (ref 70–99)

## 2019-03-26 MED ORDER — SODIUM CHLORIDE 0.9 % IV SOLN
2.0000 g | Freq: Once | INTRAVENOUS | Status: AC
  Administered 2019-03-26: 2 g via INTRAVENOUS
  Filled 2019-03-26: qty 20

## 2019-03-26 MED ORDER — AMIODARONE HCL 200 MG PO TABS
200.0000 mg | ORAL_TABLET | Freq: Every day | ORAL | Status: DC
  Administered 2019-03-27 – 2019-03-28 (×2): 200 mg via ORAL
  Filled 2019-03-26 (×2): qty 1

## 2019-03-26 MED ORDER — SODIUM CHLORIDE 0.9 % IV SOLN
2.0000 g | INTRAVENOUS | Status: DC
  Administered 2019-03-27: 2 g via INTRAVENOUS
  Filled 2019-03-26: qty 20
  Filled 2019-03-26: qty 2

## 2019-03-26 MED ORDER — HYDROXYZINE HCL 25 MG PO TABS
25.0000 mg | ORAL_TABLET | Freq: Three times a day (TID) | ORAL | Status: DC | PRN
  Administered 2019-03-26: 25 mg via ORAL
  Filled 2019-03-26: qty 1

## 2019-03-26 MED ORDER — IPRATROPIUM-ALBUTEROL 0.5-2.5 (3) MG/3ML IN SOLN
3.0000 mL | Freq: Four times a day (QID) | RESPIRATORY_TRACT | Status: DC
  Filled 2019-03-26 (×2): qty 3

## 2019-03-26 MED ORDER — ONDANSETRON HCL 4 MG/2ML IJ SOLN: 4.0000 mg | Freq: Four times a day (QID) | INTRAMUSCULAR | Status: DC | PRN

## 2019-03-26 MED ORDER — SODIUM CHLORIDE 0.9 % IV SOLN
500.0000 mg | Freq: Once | INTRAVENOUS | Status: AC
  Administered 2019-03-26: 500 mg via INTRAVENOUS
  Filled 2019-03-26: qty 500

## 2019-03-26 MED ORDER — ENOXAPARIN SODIUM 40 MG/0.4ML ~~LOC~~ SOLN
40.0000 mg | SUBCUTANEOUS | Status: DC
  Administered 2019-03-26 – 2019-03-27 (×2): 40 mg via SUBCUTANEOUS
  Filled 2019-03-26 (×2): qty 0.4

## 2019-03-26 MED ORDER — FUROSEMIDE 10 MG/ML IJ SOLN
40.0000 mg | Freq: Two times a day (BID) | INTRAMUSCULAR | Status: DC
  Administered 2019-03-27 – 2019-03-28 (×3): 40 mg via INTRAVENOUS
  Filled 2019-03-26 (×3): qty 4

## 2019-03-26 MED ORDER — CALCIUM CARBONATE ANTACID 500 MG PO CHEW
1.0000 | CHEWABLE_TABLET | Freq: Once | ORAL | Status: DC
  Filled 2019-03-26: qty 1

## 2019-03-26 MED ORDER — SPIRONOLACTONE 25 MG PO TABS: 12.5000 mg | ORAL_TABLET | Freq: Every day | ORAL | Status: DC

## 2019-03-26 MED ORDER — SODIUM CHLORIDE 0.9% FLUSH
3.0000 mL | Freq: Two times a day (BID) | INTRAVENOUS | Status: DC
  Administered 2019-03-26 – 2019-03-28 (×4): 3 mL via INTRAVENOUS

## 2019-03-26 MED ORDER — GABAPENTIN 300 MG PO CAPS
300.0000 mg | ORAL_CAPSULE | Freq: Three times a day (TID) | ORAL | Status: DC
  Administered 2019-03-26 – 2019-03-28 (×5): 300 mg via ORAL
  Filled 2019-03-26 (×5): qty 1

## 2019-03-26 MED ORDER — SACCHAROMYCES BOULARDII 250 MG PO CAPS
250.0000 mg | ORAL_CAPSULE | Freq: Two times a day (BID) | ORAL | Status: DC
  Administered 2019-03-26 – 2019-03-28 (×4): 250 mg via ORAL
  Filled 2019-03-26 (×5): qty 1

## 2019-03-26 MED ORDER — INSULIN ASPART 100 UNIT/ML ~~LOC~~ SOLN: 0.0000 [IU] | Freq: Every day | SUBCUTANEOUS | Status: DC

## 2019-03-26 MED ORDER — SPIRONOLACTONE 25 MG PO TABS
12.5000 mg | ORAL_TABLET | Freq: Every day | ORAL | Status: DC
  Administered 2019-03-27 – 2019-03-28 (×2): 12.5 mg via ORAL
  Filled 2019-03-26: qty 0.5
  Filled 2019-03-26: qty 1
  Filled 2019-03-26: qty 0.5
  Filled 2019-03-26: qty 1

## 2019-03-26 MED ORDER — INSULIN ASPART 100 UNIT/ML ~~LOC~~ SOLN: 0.0000 [IU] | Freq: Three times a day (TID) | SUBCUTANEOUS | Status: DC

## 2019-03-26 MED ORDER — SODIUM CHLORIDE 0.9% FLUSH: 3.0000 mL | INTRAVENOUS | Status: DC | PRN

## 2019-03-26 MED ORDER — PANTOPRAZOLE SODIUM 40 MG PO TBEC
40.0000 mg | DELAYED_RELEASE_TABLET | Freq: Every day | ORAL | Status: DC
  Administered 2019-03-27 – 2019-03-28 (×2): 40 mg via ORAL
  Filled 2019-03-26 (×2): qty 1

## 2019-03-26 MED ORDER — ASPIRIN EC 81 MG PO TBEC
81.0000 mg | DELAYED_RELEASE_TABLET | Freq: Every day | ORAL | Status: DC
  Administered 2019-03-27 – 2019-03-28 (×2): 81 mg via ORAL
  Filled 2019-03-26 (×2): qty 1

## 2019-03-26 MED ORDER — SODIUM CHLORIDE 0.9 % IV SOLN
250.0000 mL | INTRAVENOUS | Status: DC | PRN
  Administered 2019-03-27: 18:00:00 250 mL via INTRAVENOUS

## 2019-03-26 MED ORDER — ONDANSETRON HCL 4 MG PO TABS
4.0000 mg | ORAL_TABLET | Freq: Four times a day (QID) | ORAL | Status: DC | PRN
  Administered 2019-03-26: 4 mg via ORAL
  Filled 2019-03-26: qty 1

## 2019-03-26 MED ORDER — METOPROLOL TARTRATE 25 MG PO TABS
25.0000 mg | ORAL_TABLET | Freq: Two times a day (BID) | ORAL | Status: DC
  Administered 2019-03-26 – 2019-03-28 (×4): 25 mg via ORAL
  Filled 2019-03-26 (×4): qty 1

## 2019-03-26 MED ORDER — ACETAMINOPHEN 325 MG PO TABS
650.0000 mg | ORAL_TABLET | ORAL | Status: DC | PRN
  Administered 2019-03-28: 650 mg via ORAL
  Filled 2019-03-26: qty 2

## 2019-03-26 MED ORDER — FUROSEMIDE 10 MG/ML IJ SOLN
40.0000 mg | Freq: Once | INTRAMUSCULAR | Status: AC
  Administered 2019-03-26: 18:00:00 40 mg via INTRAVENOUS
  Filled 2019-03-26: qty 4

## 2019-03-26 MED ORDER — DOXYCYCLINE HYCLATE 100 MG PO TABS
100.0000 mg | ORAL_TABLET | Freq: Two times a day (BID) | ORAL | Status: DC
  Administered 2019-03-27 – 2019-03-28 (×3): 100 mg via ORAL
  Filled 2019-03-26 (×3): qty 1

## 2019-03-26 MED ORDER — METOPROLOL TARTRATE 25 MG PO TABS: 25.0000 mg | ORAL_TABLET | Freq: Two times a day (BID) | ORAL | Status: DC

## 2019-03-26 NOTE — ED Notes (Signed)
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 She also reports that the patient should not be given tramadol as it increases her fall risk.   Daughter requests she be contacted if there's any additional information needed as she lives the closest to the hospital.   ** The above is intended solely for informational and/or communicative purposes. It should in no way be considered an endorsement of any specific treatment, therapy or action. **

## 2019-03-26 NOTE — Progress Notes (Signed)
Patient ID: VALLIE FAYETTE, female   DOB: [birth year], 85 y.o.   MRN: [id number]  ACP note  Patient present Spoke with daughter on the phone later.  Patient coming in with shortness of breath feeling weak and edema of the lower extremities.  Patient really short of breath when trying to do things.  Patient has a history of systolic congestive heart failure with severe aortic stenosis.  These 2 in combination he has a poor overall prognosis.  Patient wishes to be a DO NOT RESUSCITATE  Plan.  Gentle diuresis with IV Lasix.  Avoid afterload reduction.  Continue Spironolactone and metoprolol.  Patient also started on antibiotics just in case pneumonia.  Procalcitonin added on.  Repeat chest x-ray tomorrow.  Physical therapy evaluation.  Family requested cardiology consultation.  Time spent on ACP discussion 17 minutes Dr Loletha Grayer

## 2019-03-26 NOTE — H&P (Signed)
[facility] NAME: Jessica Blackwell    MR#:  [id number]  DATE OF BIRTH:  [birth year]  DATE OF ADMISSION:  03/26/2019  PRIMARY CARE PHYSICIAN: Idelle Crouch, MD   REQUESTING/REFERRING PHYSICIAN: Dr. Brenton Grills  CHIEF COMPLAINT:   Chief Complaint  Patient presents with  . Shortness of Breath    HISTORY OF PRESENT ILLNESS:  Jessica Blackwell  is a 85 y.o. female with a known history of CHF with aortic stenosis presents with swelling of the feet and legs and shortness of breath.  She has been feeling very weak.  She is short of breath especially when she is moving around.  She cannot get around like she used to.  She lives alone.  Some nausea.  She does get diarrhea often.  In the ER she had a chest x-ray that showed mild interstitial prominence could be pulmonary vascular congestion versus atypical infection.  She has new patchy opacities at the bases.  Hospitalist services contacted for further evaluation.  PAST MEDICAL HISTORY:   Past Medical History:  Diagnosis Date  . Aortic stenosis   . Arthritis   . CHF (congestive heart failure) ([place])   . Fall at home 07/28/2015  . GERD (gastroesophageal reflux disease)   . H/O bladder infections   . Hypertension   . Neuropathy    lower extrmities    PAST SURGICAL HISTORY:   Past Surgical History:  Procedure Laterality Date  . ABDOMINAL HYSTERECTOMY    . CATARACT EXTRACTION    . COLON SURGERY    . ESOPHAGOGASTRODUODENOSCOPY N/A 05/22/2018   Procedure: ESOPHAGOGASTRODUODENOSCOPY (EGD);  Surgeon: Virgel Manifold, MD;  Location: [facility] ENDOSCOPY;  Service: Endoscopy;  Laterality: N/A;  . EYE SURGERY    . FOOT SURGERY Right   . ROTATOR CUFF REPAIR Left   . TONSILLECTOMY      SOCIAL HISTORY:   Social History   Tobacco Use  . Smoking status: Never Smoker  . Smokeless tobacco: Never Used  Substance Use Topics  . Alcohol use: No    FAMILY HISTORY:   Family History   Problem Relation Age of Onset  . Hypertension Father   . CAD Mother     DRUG ALLERGIES:   Allergies  Allergen Reactions  . Codeine Other (See Comments)    Upset stomach  . Ciprofloxacin Itching and Rash  . Sulfamethoxazole-Trimethoprim Rash    REVIEW OF SYSTEMS:  CONSTITUTIONAL: No fever, chills or sweats.  Positive for weakness and fatigue.  Unable to tell me if she gained weight or lost weight. EYES: No blurred or double vision.  EARS, NOSE, AND THROAT: No tinnitus or ear pain. No sore throat.  Positive for runny nose RESPIRATORY: Positive for cough, and shortness of breath.no wheezing or hemoptysis.  CARDIOVASCULAR: No chest pain, orthopnea, edema.  GASTROINTESTINAL: No nausea, vomiting, or abdominal pain. No blood in bowel movements.  Positive for diarrhea GENITOURINARY: No dysuria, hematuria.  ENDOCRINE: No polyuria, nocturia,  HEMATOLOGY: No anemia, easy bruising or bleeding SKIN: No rash or lesion. MUSCULOSKELETAL: Positive for leg pain and neuropathy NEUROLOGIC: No tingling, numbness, weakness.  PSYCHIATRY: No anxiety or depression.   MEDICATIONS AT HOME:   Prior to Admission medications   Medication Sig Start Date End Date Taking? Authorizing Provider  acetaminophen (TYLENOL) 325 MG tablet Take 2 tablets (650 mg total) by mouth every 6 (six) hours as needed for mild pain (or Fever >/= 101). 05/26/18   Nicholes Mango, MD  amiodarone (PACERONE) 200 MG tablet Take 1 tablet (200 mg total) by mouth daily. 05/27/18   Nicholes Mango, MD  aspirin EC 81 MG tablet Take 81 mg by mouth daily.    [provider]  cefdinir (OMNICEF) 300 MG capsule Take 1 capsule (300 mg total) by mouth every 12 (twelve) hours. 05/26/18   Nicholes Mango, MD  furosemide (LASIX) 20 MG tablet Take 2 tablets (40 mg total) by mouth 2 (two) times daily. 12/23/16   Bettey Costa, MD  gabapentin (NEURONTIN) 300 MG capsule Take 300 mg by mouth 3 (three) times daily.    [provider]  hydrOXYzine  (ATARAX/VISTARIL) 25 MG tablet Take 1 tablet (25 mg total) by mouth 3 (three) times daily as needed for anxiety. 05/26/18   Gouru, Illene Silver, MD  insulin aspart (NOVOLOG) 100 UNIT/ML injection Inject 0-9 Units into the skin 3 (three) times daily with meals. CBG < 70: implement hypoglycemia protocol CBG 70 - 120: 0 units CBG 121 - 150: 1 unit CBG 151 - 200: 2 units CBG 201 - 250: 3 units CBG 251 - 300: 5 units CBG 301 - 350: 7 units CBG 351 - 400: 9 units CBG > 400: call MD and obtain STAT lab verification 05/26/18   Gouru, Illene Silver, MD  insulin aspart (NOVOLOG) 100 UNIT/ML injection Inject 0-5 Units into the skin at bedtime. CBG < 70: implement hypoglycemia protocol CBG 70 - 120: 0 units CBG 121 - 150: 0 units CBG 151 - 200: 0 units CBG 201 - 250: 2 units CBG 251 - 300: 3 units CBG 301 - 350: 4 units CBG 351 - 400: 5 units CBG > 400: call MD and obtain STAT lab verification 05/26/18   Nicholes Mango, MD  metoprolol tartrate (LOPRESSOR) 25 MG tablet Take 1 tablet (25 mg total) by mouth 2 (two) times daily. 05/26/18   Gouru, Illene Silver, MD  ondansetron (ZOFRAN) 4 MG tablet Take 1 tablet (4 mg total) by mouth every 6 (six) hours as needed for nausea. 05/26/18   Gouru, Illene Silver, MD  pantoprazole (PROTONIX) 40 MG tablet Take 40 mg by mouth daily.    [provider]  saccharomyces boulardii (FLORASTOR) 250 MG capsule Take 1 capsule (250 mg total) by mouth 2 (two) times daily. 05/26/18   Nicholes Mango, MD  spironolactone (ALDACTONE) 25 MG tablet Take 12.5 mg by mouth daily.    [provider]      VITAL SIGNS:  Blood pressure (!) 180/100, pulse 63, temperature 98.4 F (36.9 C), temperature source Oral, resp. rate 20, height 5\' 1"  (1.549 m), weight 66.7 kg, SpO2 99 %.  PHYSICAL EXAMINATION:  GENERAL:  85 y.o.-year-old patient lying in the bed with no acute distress.  EYES: Pupils equal, round, reactive to light and accommodation. No scleral icterus. Extraocular muscles intact.  HEENT: Head  atraumatic, normocephalic. Oropharynx and nasopharynx clear.  NECK:  Supple, no jugular venous distention. No thyroid enlargement, no tenderness.  LUNGS: Decreased breath sounds bilateral bases. No use of accessory muscles of respiration.  Positive rhonchi bilateral bases CARDIOVASCULAR: S1, S2 normal.  4 out of 6 systolic murmur.  No rubs, or gallops.  ABDOMEN: Soft, nontender, nondistended. Bowel sounds present. No organomegaly or mass.  EXTREMITIES: 2+ pedal edema.  Bilateral feet cold to touch NEUROLOGIC: Cranial nerves II through XII are intact. Muscle strength 5/5 in all extremities. Sensation intact. Gait not checked.  PSYCHIATRIC: The patient is alert and oriented x 3.  SKIN: No rash, lesion, or ulcer.  LABORATORY PANEL:   CBC Recent Labs  Lab 03/26/19 1706  WBC 4.0  HGB 14.8  HCT 48.1*  PLT 66*   ------------------------------------------------------------------------------------------------------------------  Chemistries  Recent Labs  Lab 03/26/19 1706  NA 140  K 3.8  CL 100  CO2 29  GLUCOSE 109*  BUN 14  CREATININE 0.86  CALCIUM 9.3   ------------------------------------------------------------------------------------------------------------------  Cardiac Enzymes 2 high-sensitivity troponins negative at 11  RADIOLOGY:  Dg Chest Portable 1 View  Result Date: 03/26/2019 CLINICAL DATA:  Shortness of breath.  Crackles. EXAM: PORTABLE CHEST 1 VIEW COMPARISON:  CT scan May 2014. Multiple chest x-rays since July 28, 2015. FINDINGS: The patient's known left pleural based mass seen on the CT scanner 2014 is similar in the interval measuring 3.3 cm today, unchanged since September 2018. Stable cardiomegaly. The cardiomediastinal silhouette is stable. The contour abnormality in the left side of the mediastinum is consistent with an enlarged pulmonary artery seen on previous CT imaging, similar in the interval. The findings suggest pulmonary arterial hypertension. No  pneumothorax. Increased interstitial markings in the lungs. Patchy opacities in the bases. IMPRESSION: 1. New patchy opacities in the bases may represent atelectasis or developing infiltrate such as pneumonia or aspiration. Recommend clinical correlation and follow-up to resolution. 2. Mild increased interstitial prominence suggest pulmonary venous congestion versus atypical infection. 3. Stable left pleural base mass. 4. Stable enlarged pulmonary artery consistent with pulmonary arterial hypertension. Electronically Signed   By: Dorise Bullion III M.D   On: 03/26/2019 17:47    EKG:   Sinus rhythm 60 bpm, LVH, intraventricular conduction delay, poor R wave progression  IMPRESSION AND PLAN:   1.  Acute hypoxic respiratory failure.  Oxygen supplementation 2.  Acute systolic congestive heart failure with aortic stenosis.  Hold off on any afterload reduction.  IV Lasix 40 mg twice daily.  Continue beta-blocker.  Family requested cardiology consultation.  Patient sees Dr. Lorinda Creed as outpatient.  Notified through epic [facility] cardiology. 3.  Possibility of pneumonia.  Send off procalcitonin.  Continue Rocephin and doxycycline for now. 4.  Paroxysmal atrial fibrillation on amiodarone 5.  Neuropathy on gabapentin 6.  Accelerated hypertension.  Give spironolactone now and metoprolol after.  All the records are reviewed and case discussed with ED provider. Management plans discussed with the patient, family and they are in agreement.  CODE STATUS: DNR  TOTAL TIME TAKING CARE OF THIS PATIENT: 50 minutes.    Loletha Grayer M.D on 03/26/2019 at 7:50 PM  Between 7am to 6pm - Pager - [phone]  After 6pm call admission pager [phone]  [facility]  [phone]  CC: Primary care physician; Idelle Crouch, MD

## 2019-03-26 NOTE — ED Notes (Signed)
ED TO INPATIENT HANDOFF REPORT  ED Nurse Name and Phone #: Joelene Millin [phone]  S Name/Age/Gender Jessica Blackwell 85 y.o. female Room/Bed: ED05A/ED05A  Code Status   Code Status: DNR  Home/SNF/Other Home Patient oriented to: self, place, time and situation Is this baseline? Yes   Triage Complete: Triage complete  Chief Complaint SOB  Triage Note Pt arrived via EMS for report of SHOB that has increased over the past week - pt refused O2 despite O2 sat 88% - on arrival to ED O2 sat 83% - convinced pt to wear O2 (increased to 4L to maintain O2 sat at 95%) - pt family states that she lives at 88% Pt also c/o bilat lower ext edema and PCP advised her today to increase lasix from 20mg  to 40mg    Allergies Allergies  Allergen Reactions  . Codeine Other (See Comments)    Upset stomach  . Ciprofloxacin Itching and Rash  . Sulfamethoxazole-Trimethoprim Rash    Level of Care/Admitting Diagnosis ED Disposition    ED Disposition Condition [facility] Area: [place] [[id number]]  Level of Care: Telemetry [5]  Covid Evaluation: Confirmed COVID Negative  Diagnosis: CHF (congestive heart failure) [facility]) [[id number]]  Admitting Physician: Loletha Grayer [[id number]]  Attending Physician: Loletha Grayer [phone]  Estimated length of stay: past midnight tomorrow  Certification:: I certify this patient will need inpatient services for at least 2 midnights  PT Class (Do Not Modify): Inpatient [101]  PT Acc Code (Do Not Modify): Private [1]       B Medical/Surgery History Past Medical History:  Diagnosis Date  . Aortic stenosis   . Arthritis   . CHF (congestive heart failure) ([place])   . Fall at home 07/28/2015  . GERD (gastroesophageal reflux disease)   . H/O bladder infections   . Hypertension   . Neuropathy    lower extrmities   Past Surgical History:  Procedure Laterality Date  . ABDOMINAL HYSTERECTOMY    . CATARACT EXTRACTION    . COLON  SURGERY    . ESOPHAGOGASTRODUODENOSCOPY N/A 05/22/2018   Procedure: ESOPHAGOGASTRODUODENOSCOPY (EGD);  Surgeon: Virgel Manifold, MD;  Location: [facility] ENDOSCOPY;  Service: Endoscopy;  Laterality: N/A;  . EYE SURGERY    . FOOT SURGERY Right   . ROTATOR CUFF REPAIR Left   . TONSILLECTOMY       A IV Location/Drains/Wounds Patient Lines/Drains/Airways Status   Active Line/Drains/Airways    Name:   Placement date:   Placement time:   Site:   Days:   Peripheral IV 03/26/19 Right Forearm   03/26/19    1653    Forearm   less than 1   External Urinary Catheter   05/20/18    1429    -   310   External Urinary Catheter   03/26/19    1730    -   less than 1          Intake/Output Last 24 hours No intake or output data in the 24 hours ending 03/26/19 2035  Labs/Imaging Results for orders placed or performed during the hospital encounter of 03/26/19 (from the past 48 hour(s))  Basic metabolic panel     Status: Abnormal   Collection Time: 03/26/19  5:06 PM  Result Value Ref Range   Sodium 140 135 - 145 mmol/L   Potassium 3.8 3.5 - 5.1 mmol/L   Chloride 100 98 - 111 mmol/L   CO2 29 22 - 32 mmol/L   Glucose,  Bld 109 (H) 70 - 99 mg/dL   BUN 14 8 - 23 mg/dL   Creatinine, Ser 0.86 0.44 - 1.00 mg/dL   Calcium 9.3 8.9 - 10.3 mg/dL   GFR calc non Af Amer 59 (L) >60 mL/min   GFR calc Af Amer >60 >60 mL/min   Anion gap 11 5 - 15    Comment: Performed at [facility], [address] [place], [place] [address]  CBC with Differential     Status: Abnormal   Collection Time: 03/26/19  5:06 PM  Result Value Ref Range   WBC 4.0 4.0 - 10.5 K/uL   RBC 5.60 (H) 3.87 - 5.11 MIL/uL   Hemoglobin 14.8 12.0 - 15.0 g/dL   HCT 48.1 (H) 36.0 - 46.0 %   MCV 85.9 80.0 - 100.0 fL   MCH 26.4 26.0 - 34.0 pg   MCHC 30.8 30.0 - 36.0 g/dL   RDW 16.0 (H) 11.5 - 15.5 %   Platelets 66 (L) 150 - 400 K/uL    Comment: Immature Platelet Fraction may be clinically indicated, consider ordering this  additional test [id number]    nRBC 0.0 0.0 - 0.2 %   Neutrophils Relative % 58 %   Neutro Abs 2.3 1.7 - 7.7 K/uL   Lymphocytes Relative 30 %   Lymphs Abs 1.2 0.7 - 4.0 K/uL   Monocytes Relative 9 %   Monocytes Absolute 0.4 0.1 - 1.0 K/uL   Eosinophils Relative 1 %   Eosinophils Absolute 0.0 0.0 - 0.5 K/uL   Basophils Relative 2 %   Basophils Absolute 0.1 0.0 - 0.1 K/uL   Immature Granulocytes 0 %   Abs Immature Granulocytes 0.01 0.00 - 0.07 K/uL    Comment: Performed at [facility], [place], [place] [address]  Troponin I (High Sensitivity)     Status: None   Collection Time: 03/26/19  5:06 PM  Result Value Ref Range   Troponin I (High Sensitivity) 11 <18 ng/L    Comment: (NOTE) Elevated high sensitivity troponin I (hsTnI) values and significant  changes across serial measurements may suggest ACS but many other  chronic and acute conditions are known to elevate hsTnI results.  Refer to the "Links" section for chest pain algorithms and additional  guidance. Performed at [facility], [address] [place], [place] [address]   SARS Coronavirus 2 (CEPHEID- Performed in [facility] hospital lab), Hosp Order     Status: None   Collection Time: 03/26/19  5:06 PM   Specimen: Nasopharyngeal Swab  Result Value Ref Range   SARS Coronavirus 2 NEGATIVE NEGATIVE    Comment: (NOTE) If result is NEGATIVE SARS-CoV-2 target nucleic acids are NOT DETECTED. The SARS-CoV-2 RNA is generally detectable in upper and lower  respiratory specimens during the acute phase of infection. The lowest  concentration of SARS-CoV-2 viral copies this assay can detect is 250  copies / mL. A negative result does not preclude SARS-CoV-2 infection  and should not be used as the sole basis for treatment or other  patient management decisions.  A negative result may occur with  improper specimen collection / handling, submission of specimen other  than nasopharyngeal swab,  presence of viral mutation(s) within the  areas targeted by this assay, and inadequate number of viral copies  (<250 copies / mL). A negative result must be combined with clinical  observations, patient history, and epidemiological information. If result is POSITIVE SARS-CoV-2 target nucleic acids are DETECTED. The  SARS-CoV-2 RNA is generally detectable in upper and lower  respiratory specimens dur ing the acute phase of infection.  Positive  results are indicative of active infection with SARS-CoV-2.  Clinical  correlation with patient history and other diagnostic information is  necessary to determine patient infection status.  Positive results do  not rule out bacterial infection or co-infection with other viruses. If result is PRESUMPTIVE POSTIVE SARS-CoV-2 nucleic acids MAY BE PRESENT.   A presumptive positive result was obtained on the submitted specimen  and confirmed on repeat testing.  While 2019 novel coronavirus  (SARS-CoV-2) nucleic acids may be present in the submitted sample  additional confirmatory testing may be necessary for epidemiological  and / or clinical management purposes  to differentiate between  SARS-CoV-2 and other Sarbecovirus currently known to infect humans.  If clinically indicated additional testing with an alternate test  methodology [phone]) is advised. The SARS-CoV-2 RNA is generally  detectable in upper and lower respiratory sp ecimens during the acute  phase of infection. The expected result is Negative. Fact Sheet for Patients:  StrictlyIdeas.no Fact Sheet for Healthcare Providers: BankingDealers.co.za This test is not yet approved or cleared by the [place] FDA and has been authorized for detection and/or diagnosis of SARS-CoV-2 by FDA under an Emergency Use Authorization (EUA).  This EUA will remain in effect (meaning this test can be used) for the duration of the COVID-19 declaration under  Section 564(b)(1) of the Act, 21 U.S.C. section 360bbb-3(b)(1), unless the authorization is terminated or revoked sooner. Performed at [facility], [address] [place], [place] [address]   Procalcitonin     Status: None   Collection Time: 03/26/19  5:06 PM  Result Value Ref Range   Procalcitonin <0.10 ng/mL    Comment:        Interpretation: PCT (Procalcitonin) <= 0.5 ng/mL: Systemic infection (sepsis) is not likely. Local bacterial infection is possible. (NOTE)       Sepsis PCT Algorithm           Lower Respiratory Tract                                      Infection PCT Algorithm    ----------------------------     ----------------------------         PCT < 0.25 ng/mL                PCT < 0.10 ng/mL         Strongly encourage             Strongly discourage   discontinuation of antibiotics    initiation of antibiotics    ----------------------------     -----------------------------       PCT 0.25 - 0.50 ng/mL            PCT 0.10 - 0.25 ng/mL               OR       >80% decrease in PCT            Discourage initiation of                                            antibiotics      Encourage discontinuation  of antibiotics    ----------------------------     -----------------------------         PCT >= 0.50 ng/mL              PCT 0.26 - 0.50 ng/mL               AND        <80% decrease in PCT             Encourage initiation of                                             antibiotics       Encourage continuation           of antibiotics    ----------------------------     -----------------------------        PCT >= 0.50 ng/mL                  PCT > 0.50 ng/mL               AND         increase in PCT                  Strongly encourage                                      initiation of antibiotics    Strongly encourage escalation           of antibiotics                                     -----------------------------                                            PCT <= 0.25 ng/mL                                                 OR                                        > 80% decrease in PCT                                     Discontinue / Do not initiate                                             antibiotics Performed at [facility], [address] [place], [place] [address]   Troponin I (High Sensitivity)     Status: None   Collection Time: 03/26/19  7:06 PM  Result Value Ref Range   Troponin I (High Sensitivity) 11 <18 ng/L  Comment: (NOTE) Elevated high sensitivity troponin I (hsTnI) values and significant  changes across serial measurements may suggest ACS but many other  chronic and acute conditions are known to elevate hsTnI results.  Refer to the "Links" section for chest pain algorithms and additional  guidance. Performed at [facility], [address], [place], [place] [address]    Dg Chest Portable 1 View  Result Date: 03/26/2019 CLINICAL DATA:  Shortness of breath.  Crackles. EXAM: PORTABLE CHEST 1 VIEW COMPARISON:  CT scan May 2014. Multiple chest x-rays since July 28, 2015. FINDINGS: The patient's known left pleural based mass seen on the CT scanner 2014 is similar in the interval measuring 3.3 cm today, unchanged since September 2018. Stable cardiomegaly. The cardiomediastinal silhouette is stable. The contour abnormality in the left side of the mediastinum is consistent with an enlarged pulmonary artery seen on previous CT imaging, similar in the interval. The findings suggest pulmonary arterial hypertension. No pneumothorax. Increased interstitial markings in the lungs. Patchy opacities in the bases. IMPRESSION: 1. New patchy opacities in the bases may represent atelectasis or developing infiltrate such as pneumonia or aspiration. Recommend clinical correlation and follow-up to resolution. 2. Mild increased interstitial prominence suggest pulmonary venous congestion versus atypical infection.  3. Stable left pleural base mass. 4. Stable enlarged pulmonary artery consistent with pulmonary arterial hypertension. Electronically Signed   By: Dorise Bullion III M.D   On: 03/26/2019 17:47    Pending Labs Unresulted Labs (From admission, onward)    Start     Ordered   04/02/19 0500  Creatinine, serum  (enoxaparin (LOVENOX)    CrCl >/= 30 ml/min)  Weekly,   STAT    Comments: while on enoxaparin therapy    03/26/19 1948   03/27/19 3267  Basic metabolic panel  Daily,   STAT     03/26/19 1948   03/27/19 0500  Procalcitonin  Daily,   STAT     03/26/19 1949   03/26/19 1950  Procalcitonin - Baseline  Add-on,   AD     03/26/19 1949   03/26/19 1946  Hemoglobin A1c  Add-on,   AD    Comments: To assess prior glycemic control    03/26/19 1945   03/26/19 1946  Creatinine, serum  (enoxaparin (LOVENOX)    CrCl >/= 30 ml/min)  Once,   STAT    Comments: Baseline for enoxaparin therapy IF NOT ALREADY DRAWN.    03/26/19 1948          Vitals/Pain Today's Vitals   03/26/19 1930 03/26/19 1945 03/26/19 2015 03/26/19 2030  BP:      Pulse: 98  69 69  Resp: 18 (!) 23    Temp:      TempSrc:      SpO2: (!) 78%  95% 97%  Weight:      Height:      PainSc:        Isolation Precautions No active isolations  Medications Medications  cefTRIAXone (ROCEPHIN) 2 g in sodium chloride 0.9 % 100 mL IVPB (2 g Intravenous New Bag/Given 03/26/19 2032)  azithromycin (ZITHROMAX) 500 mg in sodium chloride 0.9 % 250 mL IVPB (has no administration in time range)  aspirin EC tablet 81 mg (has no administration in time range)  amiodarone (PACERONE) tablet 200 mg (has no administration in time range)  hydrOXYzine (ATARAX/VISTARIL) tablet 25 mg (has no administration in time range)  ondansetron (ZOFRAN) tablet 4 mg (has no administration in time range)  pantoprazole (PROTONIX) EC  tablet 40 mg (has no administration in time range)  saccharomyces boulardii (FLORASTOR) capsule 250 mg (has no administration in  time range)  gabapentin (NEURONTIN) capsule 300 mg (has no administration in time range)  furosemide (LASIX) injection 40 mg (has no administration in time range)  insulin aspart (novoLOG) injection 0-5 Units (has no administration in time range)  insulin aspart (novoLOG) injection 0-9 Units (has no administration in time range)  sodium chloride flush (NS) 0.9 % injection 3 mL (has no administration in time range)  sodium chloride flush (NS) 0.9 % injection 3 mL (has no administration in time range)  0.9 %  sodium chloride infusion (has no administration in time range)  acetaminophen (TYLENOL) tablet 650 mg (has no administration in time range)  ondansetron (ZOFRAN) injection 4 mg (has no administration in time range)  enoxaparin (LOVENOX) injection 40 mg (has no administration in time range)  doxycycline (VIBRA-TABS) tablet 100 mg (has no administration in time range)  cefTRIAXone (ROCEPHIN) 2 g in sodium chloride 0.9 % 100 mL IVPB (has no administration in time range)  ipratropium-albuterol (DUONEB) 0.5-2.5 (3) MG/3ML nebulizer solution 3 mL (has no administration in time range)  spironolactone (ALDACTONE) tablet 12.5 mg (has no administration in time range)  metoprolol tartrate (LOPRESSOR) tablet 25 mg (has no administration in time range)  furosemide (LASIX) injection 40 mg (40 mg Intravenous Given 03/26/19 1730)    Mobility walks with device Low fall risk   Focused Assessments Cardiac Assessment Handoff:    Lab Results  Component Value Date   CKTOTAL 309 (H) 06/03/2012   CKMB 6.7 (H) 06/03/2012   TROPONINI 0.08 (HH) 05/22/2018   No results found for: DDIMER Does the Patient currently have chest pain? No     R Recommendations: See Admitting Provider Note  Report given to:   Additional Notes:

## 2019-03-26 NOTE — ED Triage Notes (Signed)
Pt arrived via EMS for report of SHOB that has increased over the past week - pt refused O2 despite O2 sat 88% - on arrival to ED O2 sat 83% - convinced pt to wear O2 (increased to 4L to maintain O2 sat at 95%) - pt family states that she lives at 88% Pt also c/o bilat lower ext edema and PCP advised her today to increase lasix from 20mg  to 40mg 

## 2019-03-26 NOTE — ED Provider Notes (Signed)
[facility] Emergency Department Provider Note  ____________________________________________  Time seen: Approximately 6:57 PM  I have reviewed the triage vital signs and the nursing notes.   HISTORY  Chief Complaint Shortness of Breath    HPI Jessica Blackwell is a 85 y.o. female with a history of CHF, hypertension, GERD who is brought to the ED for worsening shortness of breath over the past week, constant.  Worse with walking, worse lying down supine.  Better with resting sitting upright.  Normally her oxygenation is about 88% on room air and today was found to be 83% on room air by EMS.  She normally is on Lasix 20 mg daily, today she tried taking a double dose due to her increasing leg swelling.  She denies chest pain.  No other associated symptoms.      Past Medical History:  Diagnosis Date  . Arthritis   . CHF (congestive heart failure) ([place])   . Fall at home 07/28/2015  . GERD (gastroesophageal reflux disease)   . H/O bladder infections   . Hypertension   . Neuropathy    lower extrmities     Patient Active Problem List   Diagnosis Date Noted  . Non-intractable vomiting with nausea   . LOC (loss of consciousness) ([place]) 12/22/2016  . AKI (acute kidney injury) ([place]) 08/01/2015  . V-tach ([place]) 08/01/2015  . NSVT (nonsustained ventricular tachycardia) ([place])   . Persistent atrial fibrillation   . Acute systolic congestive heart failure ([place]) 07/31/2015  . Acute respiratory failure with hypoxia ([place]) 07/28/2015  . Fall at home 07/28/2015  . GERD (gastroesophageal reflux disease) 07/28/2015  . HTN (hypertension) 07/28/2015  . Anemia 07/28/2015     Past Surgical History:  Procedure Laterality Date  . ABDOMINAL HYSTERECTOMY    . CATARACT EXTRACTION    . COLON SURGERY    . ESOPHAGOGASTRODUODENOSCOPY N/A 05/22/2018   Procedure: ESOPHAGOGASTRODUODENOSCOPY (EGD);  Surgeon: Virgel Manifold, MD;  Location: [facility] ENDOSCOPY;  Service: Endoscopy;   Laterality: N/A;  . EYE SURGERY    . FOOT SURGERY Right   . ROTATOR CUFF REPAIR Left   . TONSILLECTOMY       Prior to Admission medications   Medication Sig Start Date End Date Taking? Authorizing Provider  acetaminophen (TYLENOL) 325 MG tablet Take 2 tablets (650 mg total) by mouth every 6 (six) hours as needed for mild pain (or Fever >/= 101). 05/26/18   Nicholes Mango, MD  amiodarone (PACERONE) 200 MG tablet Take 1 tablet (200 mg total) by mouth daily. 05/27/18   Nicholes Mango, MD  aspirin EC 81 MG tablet Take 81 mg by mouth daily.    [provider]  cefdinir (OMNICEF) 300 MG capsule Take 1 capsule (300 mg total) by mouth every 12 (twelve) hours. 05/26/18   Nicholes Mango, MD  furosemide (LASIX) 20 MG tablet Take 2 tablets (40 mg total) by mouth 2 (two) times daily. 12/23/16   Bettey Costa, MD  gabapentin (NEURONTIN) 300 MG capsule Take 300 mg by mouth 3 (three) times daily.    [provider]  hydrOXYzine (ATARAX/VISTARIL) 25 MG tablet Take 1 tablet (25 mg total) by mouth 3 (three) times daily as needed for anxiety. 05/26/18   Gouru, Illene Silver, MD  insulin aspart (NOVOLOG) 100 UNIT/ML injection Inject 0-9 Units into the skin 3 (three) times daily with meals. CBG < 70: implement hypoglycemia protocol CBG 70 - 120: 0 units CBG 121 - 150: 1 unit CBG 151 - 200: 2 units  CBG 201 - 250: 3 units CBG 251 - 300: 5 units CBG 301 - 350: 7 units CBG 351 - 400: 9 units CBG > 400: call MD and obtain STAT lab verification 05/26/18   Gouru, Illene Silver, MD  insulin aspart (NOVOLOG) 100 UNIT/ML injection Inject 0-5 Units into the skin at bedtime. CBG < 70: implement hypoglycemia protocol CBG 70 - 120: 0 units CBG 121 - 150: 0 units CBG 151 - 200: 0 units CBG 201 - 250: 2 units CBG 251 - 300: 3 units CBG 301 - 350: 4 units CBG 351 - 400: 5 units CBG > 400: call MD and obtain STAT lab verification 05/26/18   Nicholes Mango, MD  metoprolol tartrate (LOPRESSOR) 25 MG tablet Take 1 tablet (25 mg total) by  mouth 2 (two) times daily. 05/26/18   Gouru, Illene Silver, MD  ondansetron (ZOFRAN) 4 MG tablet Take 1 tablet (4 mg total) by mouth every 6 (six) hours as needed for nausea. 05/26/18   Gouru, Illene Silver, MD  pantoprazole (PROTONIX) 40 MG tablet Take 40 mg by mouth daily.    [provider]  saccharomyces boulardii (FLORASTOR) 250 MG capsule Take 1 capsule (250 mg total) by mouth 2 (two) times daily. 05/26/18   Nicholes Mango, MD  spironolactone (ALDACTONE) 25 MG tablet Take 12.5 mg by mouth daily.    [provider]     Allergies Codeine, Ciprofloxacin, and Sulfamethoxazole-trimethoprim   Family History  Problem Relation Age of Onset  . Hypertension Father   . CAD Mother     Social History Social History   Tobacco Use  . Smoking status: Never Smoker  . Smokeless tobacco: Never Used  Substance Use Topics  . Alcohol use: No  . Drug use: No    Review of Systems  Constitutional:   No fever or chills.  ENT:   No sore throat. No rhinorrhea. Cardiovascular:   No chest pain or syncope. Respiratory:   As it of shortness of breath without cough. Gastrointestinal:   Negative for abdominal pain, vomiting and diarrhea.  Musculoskeletal: Positive bilateral lower leg edema All other systems reviewed and are negative except as documented above in ROS and HPI.  ____________________________________________   PHYSICAL EXAM:  VITAL SIGNS: ED Triage Vitals  Enc Vitals Group     BP 03/26/19 1648 (!) 159/97     Pulse Rate 03/26/19 1648 61     Resp 03/26/19 1648 20     Temp 03/26/19 1648 98.4 F (36.9 C)     Temp Source 03/26/19 1648 Oral     SpO2 03/26/19 1647 (!) 83 %     Weight 03/26/19 1649 147 lb (66.7 kg)     Height 03/26/19 1649 5\' 1"  (1.549 m)     Head Circumference --      Peak Flow --      Pain Score 03/26/19 1648 0     Pain Loc --      Pain Edu? --      Excl. in [place]? --     Vital signs reviewed, nursing assessments reviewed.   Constitutional:   Alert and  oriented. Non-toxic appearance. Eyes:   Conjunctivae are normal. EOMI. PERRL. ENT      Head:   Normocephalic and atraumatic.      Nose:   No congestion/rhinnorhea.       Mouth/Throat:   MMM, no pharyngeal erythema. No peritonsillar mass.       Neck:   No meningismus. Full ROM.  Elevated jugular  venous pressure Hematological/Lymphatic/Immunilogical:   No cervical lymphadenopathy. Cardiovascular:   RRR. Symmetric bilateral radial and DP pulses.  No murmurs. Cap refill less than 2 seconds. Respiratory:   Normal respiratory effort without tachypnea/retractions.  Bibasilar crackles Gastrointestinal:   Soft and nontender. Non distended. There is no CVA tenderness.  No rebound, rigidity, or guarding. Musculoskeletal:   Normal range of motion in all extremities. No joint effusions.  No lower extremity tenderness.  2+ pitting edema bilateral lower extremities. Neurologic:   Normal speech and language.  Motor grossly intact. No acute focal neurologic deficits are appreciated.  Skin:    Skin is warm, dry and intact. No rash noted.  No petechiae, purpura, or bullae.  ____________________________________________    LABS (pertinent positives/negatives) (all labs ordered are listed, but only abnormal results are displayed) Labs Reviewed  BASIC METABOLIC PANEL - Abnormal; Notable for the following components:      Result Value   Glucose, Bld 109 (*)    GFR calc non Af Amer 59 (*)    All other components within normal limits  CBC WITH DIFFERENTIAL/PLATELET - Abnormal; Notable for the following components:   RBC 5.60 (*)    HCT 48.1 (*)    RDW 16.0 (*)    Platelets 66 (*)    All other components within normal limits  SARS CORONAVIRUS 2 (HOSPITAL ORDER, [place] [facility])  PROCALCITONIN  TROPONIN I (HIGH SENSITIVITY)  TROPONIN I (HIGH SENSITIVITY)   ____________________________________________   EKG  Interpreted by me Sinus rhythm rate of 60, left axis, left bundle  branch block.  No acute ischemic changes.  ____________________________________________    [id number]  Dg Chest Portable 1 View  Result Date: 03/26/2019 CLINICAL DATA:  Shortness of breath.  Crackles. EXAM: PORTABLE CHEST 1 VIEW COMPARISON:  CT scan May 2014. Multiple chest x-rays since July 28, 2015. FINDINGS: The patient's known left pleural based mass seen on the CT scanner 2014 is similar in the interval measuring 3.3 cm today, unchanged since September 2018. Stable cardiomegaly. The cardiomediastinal silhouette is stable. The contour abnormality in the left side of the mediastinum is consistent with an enlarged pulmonary artery seen on previous CT imaging, similar in the interval. The findings suggest pulmonary arterial hypertension. No pneumothorax. Increased interstitial markings in the lungs. Patchy opacities in the bases. IMPRESSION: 1. New patchy opacities in the bases may represent atelectasis or developing infiltrate such as pneumonia or aspiration. Recommend clinical correlation and follow-up to resolution. 2. Mild increased interstitial prominence suggest pulmonary venous congestion versus atypical infection. 3. Stable left pleural base mass. 4. Stable enlarged pulmonary artery consistent with pulmonary arterial hypertension. Electronically Signed   By: Dorise Bullion III M.D   On: 03/26/2019 17:47    ____________________________________________   PROCEDURES Procedures  ____________________________________________  DIFFERENTIAL DIAGNOSIS   CHF exacerbation, pulmonary edema, pneumonia, pleural effusion, non-STEMI.  STEMI, unstable angina, pneumothorax, carditis/pericardial effusion, dissection  CLINICAL IMPRESSION / ASSESSMENT AND PLAN / ED COURSE  Medications ordered in the ED: Medications  cefTRIAXone (ROCEPHIN) 2 g in sodium chloride 0.9 % 100 mL IVPB (has no administration in time range)  azithromycin (ZITHROMAX) 500 mg in sodium chloride 0.9 % 250 mL IVPB (has no  administration in time range)  furosemide (LASIX) injection 40 mg (40 mg Intravenous Given 03/26/19 1730)    Pertinent labs & imaging results that were available during my care of the patient were reviewed by me and considered in my medical decision making (see chart for details).  ETSUKO DIEROLF was evaluated in Emergency Department on 03/26/2019 for the symptoms described in the history of present illness. She was evaluated in the context of the global COVID-19 pandemic, which necessitated consideration that the patient might be at risk for infection with the SARS-CoV-2 virus that causes COVID-19. Institutional protocols and algorithms that pertain to the evaluation of patients at risk for COVID-19 are in a state of rapid change based on information released by regulatory bodies including the CDC and federal and state organizations. These policies and algorithms were followed during the patient's care in the ED.   Patient presents with acute hypoxic respiratory failure with a room air oxygen saturation of 83%.  Also describes dyspnea on exertion and orthopnea, worsening over the past several days.  Exam is consistent with congestive heart failure exacerbation.  Check a chest x-ray and labs, COVID screening.  IV Lasix for initial diuresis.  Oxygenation is normalized on 3 to 4 L nasal cannula currently.  Clinical Course as of Mar 26 1903  Fri Mar 26, 2019  1815 Labs unremarkable, troponin resulted as normal.  Chest x-ray viewed by me which is consistent with pulmonary edema.  Radiology report raises suspicion of infectious infiltrate, so I have ordered ceftriaxone and azithromycin.  The patient is not septic.  I will discuss with the hospitalist for further evaluation and management of diuresis and possible community-acquired pneumonia resulting in acute hypoxic respiratory failure.   [PS]    Clinical Course User Index [PS] Carrie Mew, MD    ----------------------------------------- 7:02 PM  on 03/26/2019 -----------------------------------------  COVID negative   ____________________________________________   FINAL CLINICAL IMPRESSION(S) / ED DIAGNOSES    Final diagnoses:  Acute on chronic systolic congestive heart failure ([place])  Acute respiratory failure with hypoxia [facility])     ED Discharge Orders    None      Portions of this note were generated with dragon dictation software. Dictation errors may occur despite best attempts at proofreading.   Carrie Mew, MD 03/26/19 1904

## 2019-03-27 ENCOUNTER — Inpatient Hospital Stay: Admit: 2019-03-27 | Payer: Medicare Other

## 2019-03-27 LAB — BASIC METABOLIC PANEL
Anion gap: 13 (ref 5–15)
BUN: 16 mg/dL (ref 8–23)
CO2: 29 mmol/L (ref 22–32)
Calcium: 8.7 mg/dL — ABNORMAL LOW (ref 8.9–10.3)
Chloride: 101 mmol/L (ref 98–111)
Creatinine, Ser: 0.82 mg/dL (ref 0.44–1.00)
GFR calc Af Amer: >60 mL/min (ref >60)
GFR calc non Af Amer: >60 mL/min (ref >60)
Glucose, Bld: 105 mg/dL — ABNORMAL HIGH (ref 70–99)
Potassium: 3.4 mmol/L — ABNORMAL LOW (ref 3.5–5.1)
Sodium: 143 mmol/L (ref 135–145)

## 2019-03-27 LAB — PROCALCITONIN
Procalcitonin: <0.1 ng/mL
Procalcitonin: <0.1 ng/mL

## 2019-03-27 LAB — HEMOGLOBIN A1C
Hgb A1c MFr Bld: 7.2 % — ABNORMAL HIGH (ref 4.8–5.6)
Mean Plasma Glucose: 159.94 mg/dL

## 2019-03-27 LAB — GLUCOSE, CAPILLARY
Glucose-Capillary: 96 mg/dL (ref 70–99)
Glucose-Capillary: 116 mg/dL — ABNORMAL HIGH (ref 70–99)

## 2019-03-27 MED ORDER — IPRATROPIUM-ALBUTEROL 0.5-2.5 (3) MG/3ML IN SOLN: 3.0000 mL | Freq: Four times a day (QID) | RESPIRATORY_TRACT | Status: DC | PRN

## 2019-03-27 MED ORDER — POTASSIUM CHLORIDE CRYS ER 20 MEQ PO TBCR
40.0000 meq | EXTENDED_RELEASE_TABLET | Freq: Once | ORAL | Status: AC
  Administered 2019-03-27: 40 meq via ORAL
  Filled 2019-03-27: qty 2

## 2019-03-27 NOTE — Plan of Care (Signed)
  Problem: Education: Goal: Ability to demonstrate management of disease process will improve Outcome: Progressing Goal: Ability to verbalize understanding of medication therapies will improve Outcome: Progressing   

## 2019-03-27 NOTE — Progress Notes (Signed)
Patient stating she is not a diabetic so in unsure why we are checking her sugars, she also keeps requesting salt and does not understand why she is on a heart healthy carb modified diet, she is requesting to be put on regular diet as this is what she eats at home. Discussed this with Dr.Gouru. Verbal orders to discontinue sugar checks and sliding scale insulin as patient is not a diabetic and chang to regular diet. Will inform patient of changes and continue to monitor patient.

## 2019-03-27 NOTE — Evaluation (Signed)
Physical Therapy Evaluation Patient Details Name: Jessica Blackwell MRN: [id number] DOB: 1925/03/11 Today's Date: 03/27/2019   History of Present Illness  Jessica Blackwell is a 45yoF who [facility] SOB, LEE, and weakness, admitted with CHF exacerbation.  Clinical Impression  Pt admitted with above diagnosis. Pt currently with functional limitations due to the deficits listed below (see "PT Problem List"). Upon entry, pt in bed, awake and agreeable to participate conditionally. The pt is alert and oriented x4, pleasant, conversational, and generally a good historian. Pt denies any acute weakness after evaluation, but pt uses heavy effort to perform simple mobility tasks and still requires min-modA for bed mobility or rising from EOB. Unable to attempt AMB 2/2 pain in feet, which are erythematous upon inspection. Functional mobility assessment demonstrates increased effort/time requirements, poor tolerance, and need for physical assistance, whereas the patient performed these at a higher level of independence PTA. Pt has good mentation and overall awareness of her limitations and safety, but would need 24/7 supervision to assist upon DC to home for physical needs. Pt will benefit from skilled PT intervention to increase independence and safety with basic mobility in preparation for discharge to the venue listed below.       Follow Up Recommendations Supervision/Assistance - 24 hour;Home health PT    Equipment Recommendations  None recommended by PT    Recommendations for Other Services       Precautions / Restrictions Precautions Precautions: Fall Restrictions Weight Bearing Restrictions: No      Mobility  Bed Mobility Overal bed mobility: Needs Assistance Bed Mobility: Supine to Sit;Sit to Supine     Supine to sit: Min assist Sit to supine: Min assist   General bed mobility comments: appears weak, requires heavy effort, asks for help from author out of convenience I  suspect.  Transfers Overall transfer level: Needs assistance Equipment used: Rolling walker (2 wheeled) Transfers: Sit to/from Stand Sit to Stand: Mod assist         General transfer comment: Pt doesn't perform well outside of her well established motor patterns, typically using a t "tug of war" STS strategy, pulling self into standing with RW and locked kneesout in front, which does not work well from hospital bed, in barefeet/grip socks, and with RW. Ultimately pt requires assistance, but likely could not perform safety without assistance. Pt denies any acute weakness.  Ambulation/Gait Ambulation/Gait assistance: (deferred d/t foot pain and incontinence in standing.)              Stairs            Wheelchair Mobility    Modified Rankin (Stroke Patients Only)       Balance                                             Pertinent Vitals/Pain Pain Assessment: No/denies pain    Home Living Family/patient expects to be discharged to:: Private residence Living Arrangements: Alone Available Help at Discharge: Family;Available PRN/intermittently(Caregiver 10-5 helps with house work.) Type of Home: House Home Access: Stairs to enter Entrance Stairs-Rails: Left Entrance Stairs-Number of Steps: 3 Home Layout: One level Home Equipment: [place] - 2 wheels;Walker - 4 wheels;Wheelchair - manual;Shower seat      Prior Function Level of Independence: Needs assistance   Gait / Transfers Assistance Needed: RW in the house, does not typically perform  AMB outside of home,.  ADL's / Homemaking Assistance Needed: caregiver assists with bathing, dressing; independent with toiletting        Hand Dominance   Dominant Hand: Right    Extremity/Trunk Assessment   Upper Extremity Assessment Upper Extremity Assessment: Generalized weakness;Overall [facility] for tasks assessed    Lower Extremity Assessment Lower Extremity Assessment: Generalized weakness;Overall  WFL for tasks assessed       Communication   Communication: HOH  Cognition Arousal/Alertness: Awake/alert Behavior During Therapy: WFL for tasks assessed/performed Overall Cognitive Status: Within Functional Limits for tasks assessed                                        General Comments      Exercises     Assessment/Plan    PT Assessment Patient needs continued PT services  PT Problem List Decreased strength;Decreased range of motion;Decreased activity tolerance;Decreased balance;Decreased mobility;Decreased coordination       PT Treatment Interventions DME instruction;Gait training;Functional mobility training;Therapeutic activities;Therapeutic exercise;Stair training;Balance training;Patient/family education    PT Goals (Current goals can be found in the Care Plan section)  Acute Rehab PT Goals Patient Stated Goal: Go home, regain strength PT Goal Formulation: With patient Time For Goal Achievement: 04/10/19 Potential to Achieve Goals: Good    Frequency Min 2X/week   Barriers to discharge Inaccessible home environment;Decreased caregiver support      Co-evaluation               AM-PAC PT "6 Clicks" Mobility  Outcome Measure Help needed turning from your back to your side while in a flat bed without using bedrails?: A Little Help needed moving from lying on your back to sitting on the side of a flat bed without using bedrails?: A Little Help needed moving to and from a bed to a chair (including a wheelchair)?: A Little Help needed standing up from a chair using your arms (e.g., wheelchair or bedside chair)?: A Lot Help needed to walk in hospital room?: A Little Help needed climbing 3-5 steps with a railing? : A Little 6 Click Score: 17    End of Session Equipment Utilized During Treatment: Oxygen Activity Tolerance: Patient tolerated treatment well;No increased pain;Patient limited by pain Patient left: in bed;with call bell/phone within  reach;with bed alarm set;Other (comment)(echo pending, waiting out in hall) Nurse Communication: Mobility status PT Visit Diagnosis: Unsteadiness on feet (R26.81);Other abnormalities of gait and mobility (R26.89);Muscle weakness (generalized) (M62.81);Difficulty in walking, not elsewhere classified (R26.2)    Time: 2683-4196 PT Time Calculation (min) (ACUTE ONLY): 20 min   Charges:   PT Evaluation $PT Eval Moderate Complexity: 1 Mod          12:43 PM, 03/27/19 Jessica Blackwell, PT, DPT Physical Therapist - [facility]  [phone] ([place])    Jessica Blackwell 03/27/2019, 12:40 PM

## 2019-03-27 NOTE — Progress Notes (Signed)
[facility] NAME: Jessica Blackwell    MR#:  [id number]  DATE OF BIRTH:  03/16/1925  SUBJECTIVE:  CHIEF COMPLAINT:  pts sob is better , no ch pain   REVIEW OF SYSTEMS:  CONSTITUTIONAL: No fever, fatigue or weakness.  EYES: No blurred or double vision.  EARS, NOSE, AND THROAT: No tinnitus or ear pain.  RESPIRATORY: improving  cough, shortness of breath, no  wheezing or hemoptysis.  CARDIOVASCULAR: No chest pain, orthopnea, edema.  GASTROINTESTINAL: No nausea, vomiting, diarrhea or abdominal pain.  GENITOURINARY: No dysuria, hematuria.  ENDOCRINE: No polyuria, nocturia,  HEMATOLOGY: No anemia, easy bruising or bleeding SKIN: No rash or lesion. MUSCULOSKELETAL: No joint pain or arthritis.   NEUROLOGIC: No tingling, numbness, weakness.  PSYCHIATRY: No anxiety or depression.   DRUG ALLERGIES:   Allergies  Allergen Reactions  . Ciprofloxacin Itching and Rash  . Codeine Other (See Comments)    Upset stomach  . Sulfamethoxazole-Trimethoprim Rash    VITALS:  Blood pressure (!) 131/58, pulse 61, temperature 98.2 F (36.8 C), temperature source Oral, resp. rate 18, height 5\' 1"  (1.549 m), weight 66.7 kg, SpO2 100 %.  PHYSICAL EXAMINATION:  GENERAL:  85 y.o.-year-old patient lying in the bed with no acute distress.  EYES: Pupils equal, round, reactive to light and accommodation. No scleral icterus. Extraocular muscles intact.  HEENT: Head atraumatic, normocephalic. Oropharynx and nasopharynx clear.  NECK:  Supple, no jugular venous distention. No thyroid enlargement, no tenderness.  LUNGS: mod  breath sounds bilaterally, no wheezing, rales,rhonchi or crepitation. No use of accessory muscles of respiration.  CARDIOVASCULAR: S1, S2 normal. No murmurs, rubs, or gallops.  ABDOMEN: Soft, nontender, nondistended. Bowel sounds present. Marland Kitchen  EXTREMITIES: No pedal edema, cyanosis, or clubbing.  NEUROLOGIC: Cranial nerves II through XII  are intact. Sensation intact. Gait not checked.  PSYCHIATRIC: The patient is alert and oriented x 3.  SKIN: No obvious rash, lesion, or ulcer.    LABORATORY PANEL:   CBC Recent Labs  Lab 03/26/19 1706  WBC 4.0  HGB 14.8  HCT 48.1*  PLT 66*   ------------------------------------------------------------------------------------------------------------------  Chemistries  Recent Labs  Lab 03/27/19 0515  NA 143  K 3.4*  CL 101  CO2 29  GLUCOSE 105*  BUN 16  CREATININE 0.82  CALCIUM 8.7*   ------------------------------------------------------------------------------------------------------------------  Cardiac Enzymes No results for input(s): TROPONINI in the last 168 hours. ------------------------------------------------------------------------------------------------------------------  RADIOLOGY:  Dg Chest Portable 1 View  Result Date: 03/26/2019 CLINICAL DATA:  Shortness of breath.  Crackles. EXAM: PORTABLE CHEST 1 VIEW COMPARISON:  CT scan May 2014. Multiple chest x-rays since July 28, 2015. FINDINGS: The patient's known left pleural based mass seen on the CT scanner 2014 is similar in the interval measuring 3.3 cm today, unchanged since September 2018. Stable cardiomegaly. The cardiomediastinal silhouette is stable. The contour abnormality in the left side of the mediastinum is consistent with an enlarged pulmonary artery seen on previous CT imaging, similar in the interval. The findings suggest pulmonary arterial hypertension. No pneumothorax. Increased interstitial markings in the lungs. Patchy opacities in the bases. IMPRESSION: 1. New patchy opacities in the bases may represent atelectasis or developing infiltrate such as pneumonia or aspiration. Recommend clinical correlation and follow-up to resolution. 2. Mild increased interstitial prominence suggest pulmonary venous congestion versus atypical infection. 3. Stable left pleural base mass. 4. Stable enlarged  pulmonary artery consistent with pulmonary arterial hypertension. Electronically Signed   By: Dorise Bullion III M.D  On: 03/26/2019 17:47    EKG:   Orders placed or performed during the hospital encounter of 03/26/19  . ED EKG  . ED EKG  . EKG 12-Lead  . EKG 12-Lead    ASSESSMENT AND PLAN:    1.  Acute hypoxic respiratory failure.  Oxygen supplementation and wean off as tolerated  2.  Acute systolic congestive heart failure with aortic stenosis.  Hold off on any afterload reduction.  IV Lasix 40 mg twice daily.  Continue beta-blocker.  Family requested cardiology consultation.  Patient sees Dr. Lorinda Creed as outpatient.  Seen by cardiology Dr. Clayborn Bigness recommending p.o. Lasix and outpatient follow-up  3.  Possibility of pneumonia.  Procalcitonin less than 0.10  Continue Rocephin and doxycycline for now. 4.  Paroxysmal atrial fibrillation on amiodarone 5.    Nonsustained V. tach we will continue close monitoring and discus with cardiology 6.  Accelerated hypertension.  Improved   Generalized weakness PT is recommending home health PT and 24-hour supervision    All the records are reviewed and case discussed with Care Management/Social Workerr. Management plans discussed with the patient, family and they are in agreement.  CODE STATUS: dnr   TOTAL TIME TAKING CARE OF THIS PATIENT: 37  minutes.   POSSIBLE D/C IN 1-2  DAYS, DEPENDING ON CLINICAL CONDITION.  Note: This dictation was prepared with Dragon dictation along with smaller phrase technology. Any transcriptional errors that result from this process are unintentional.   Nicholes Mango M.D on 03/27/2019 at 9:39 PM  Between 7am to 6pm - Pager - [phone] After 6pm go to [email or web address] - password [email or web address] [facility]  Office  [phone]  CC: Primary care physician; Idelle Crouch, MD

## 2019-03-27 NOTE — Progress Notes (Signed)
CCMD called to inform patient had 6 beats of non-sustained vtach around 1335. MD informed. Patient asymptomatic.Will continue to monitor.

## 2019-03-27 NOTE — Evaluation (Signed)
Occupational Therapy Evaluation Patient Details Name: Jessica Blackwell MRN: [id number] DOB: 11/18/1924 Today's Date: 03/27/2019    History of Present Illness Jessica Blackwell is a 21yoF who comes to [facility] on 7/10c SOB, LEE, and weakness, admitted with CHF exacerbation.   Clinical Impression   Pt seen for OT evaluation this date. Pt lives alone in a 1 level home with 3 steps to enter. Pt endorses requiring some assistance at baseline for bathing and dressing ADL tasks. She also receives assistance from a caregiver for IADL tasks including shopping and cleaning as she becomes easily fatigued or out of breath with minimal exertion. Pt states she does not use home oxygen. Pt currently requires minimal to mod assist for heavy ADL tasks including dressing and bathing due to poor activity tolerance and decreased oxygen saturation. Pt educated in energy conservation conservation strategies including pursed lip breathing, activity pacing, home/routines modifications, work simplification, AE/DME, prioritizing of meaningful occupations, and falls prevention. Handout provided. Pt verbalized understanding but would benefit from additional skilled OT services to maximize recall and carryover of learned techniques and facilitate implementation of learned techniques into daily routines. Upon discharge, recommend [place] services.       Follow Up Recommendations  Home health OT;Supervision - Intermittent    Equipment Recommendations  3 in 1 bedside commode    Recommendations for Other Services       Precautions / Restrictions Precautions Precautions: Fall Restrictions Weight Bearing Restrictions: No      Mobility Bed Mobility Overal bed mobility: Needs Assistance Bed Mobility: Supine to Sit;Sit to Supine     Supine to sit: Min assist Sit to supine: Min assist   General bed mobility comments: appears weak, requires heavy effort, asks for help from author out of convenience I suspect.  Transfers Overall  transfer level: Needs assistance Equipment used: Rolling walker (2 wheeled) Transfers: Sit to/from Stand Sit to Stand: Mod assist         General transfer comment: Deferred. Pt declined OOB activity on this date. See Physical Therapy note for further detail. Pt uses adapted motor pattern to stand at home. Would likely benefit from donning of shoes for hospital mobility attempts.    Balance Overall balance assessment: Needs assistance                                         ADL either performed or assessed with clinical judgement   ADL Overall ADL's : Needs assistance/impaired                                       General ADL Comments: Pt near baseline for ADLs. Min to mod assist for bathing/dressing in seated position 2/2 increased fatigue and SOB. Pt would benefit from additional education in AE for ADL/ECS to support improved satisfaction and safety during ADL tasks.     Vision Baseline Vision/History: Wears glasses Wears Glasses: At all times Patient Visual Report: No change from baseline       Perception     Praxis      Pertinent Vitals/Pain Pain Assessment: No/denies pain     Hand Dominance Right   Extremity/Trunk Assessment Upper Extremity Assessment Upper Extremity Assessment: Generalized weakness   Lower Extremity Assessment Lower Extremity Assessment: Generalized weakness       Communication Communication Communication: [facility]  Cognition Arousal/Alertness: Awake/alert Behavior During Therapy: WFL for tasks assessed/performed Overall Cognitive Status: Within Functional Limits for tasks assessed                                     General Comments  Pt noted to have O2 sats of 79 while supine in bed on 1.5L HFNC. OT educated pt on PLB. Sats quickly rebounded to 86 with cues for PLB, but did not exceed. RN contacted and this OT advised to increase pt to 2L. While on 2L pt O2 increased to 92 with cues for PLB,  but continued to flucctuate during evaluation. Pt noted to have increased SOB when speaking.    Exercises Other Exercises Other Exercises: Pt educated on ECS, falls prevention strategies, safe use of AE to support independence and safety during ADL tasks.   Shoulder Instructions      Home Living Family/patient expects to be discharged to:: Private residence Living Arrangements: Alone Available Help at Discharge: Family;Available PRN/intermittently([place] visits 5days/week from 10-5 Assists with cleaning, cooking, etc. Does not assist with bathing/ADL) Type of Home: House Home Access: Stairs to enter [employer] of Steps: 3 Entrance Stairs-Rails: Left Home Layout: One level     Bathroom Shower/Tub: Walk-in shower         Home Equipment: Environmental consultant - 2 wheels;Walker - 4 wheels;Wheelchair - manual;Shower seat;Hand held shower head;Adaptive equipment Adaptive Equipment: Reacher        Prior Functioning/Environment Level of Independence: Needs assistance  Gait / Transfers Assistance Needed: RW in the house, does not typically perform AMB outside of home. Endorses 0 falls in past 6 months. ADL's / Homemaking Assistance Needed: Pt states recent [facility] support assists with bathing, dressing; independent with toiletting. PCA 5 days/week assists with housework, cooking, shopping, etc. Pt states this person does not/cannot assist with bathing, dressing, etc.            OT Problem List: Cardiopulmonary status limiting activity;Decreased strength;Decreased activity tolerance;Decreased safety awareness;Decreased knowledge of use of DME or AE      OT Treatment/Interventions: Self-care/ADL training;Therapeutic exercise;Therapeutic activities;Energy conservation;DME and/or AE instruction;Patient/family education    OT Goals(Current goals can be found in the care plan section) Acute Rehab OT Goals Patient Stated Goal: Go home, regain strength OT Goal Formulation: With patient Time  For Goal Achievement: 04/10/19 Potential to Achieve Goals: Good ADL Goals Pt Will Perform Lower Body Bathing: sitting/lateral leans;with adaptive equipment;with min assist(With LRAD for improved safety and functional independence.) Pt Will Perform Lower Body Dressing: with adaptive equipment;with min assist;sit to/from stand(With LRAD for improved safety and functional independence.) Additional ADL Goal #1: Pt will verbalize a plan to implement at least 3 learned ECS into her daily routines/ADLs upon hospital DC for improved safety and functional independence during meaningful occupations of daily life.  OT Frequency: Min 1X/week   Barriers to D/C: Decreased caregiver support;Inaccessible home environment          Co-evaluation              AM-PAC OT "6 Clicks" Daily Activity     Outcome Measure Help from another person eating meals?: None Help from another person taking care of personal grooming?: A Little Help from another person toileting, which includes using toliet, bedpan, or urinal?: A Little Help from another person bathing (including washing, rinsing, drying)?: A Lot Help from another person to put on and taking off regular upper body clothing?:  None Help from another person to put on and taking off regular lower body clothing?: A Little 6 Click Score: 19   End of Session Equipment Utilized During Treatment: Oxygen Nurse Communication: Other (comment)(Pt O2 sats during session.)  Activity Tolerance: Patient limited by fatigue Patient left: in bed;with call bell/phone within reach;with bed alarm set  OT Visit Diagnosis: Muscle weakness (generalized) (M62.81);Other abnormalities of gait and mobility (R26.89)                Time: 5072-2575 OT Time Calculation (min): 24 min Charges:  OT General Charges $OT Visit: 1 Visit OT Evaluation $OT Eval Low Complexity: 1 Low OT Treatments $Self Care/Home Management : 8-22 mins  Shara Blazing, M.S., OTR/L Ascom:  [phone] 03/27/19, 3:28 PM

## 2019-03-27 NOTE — Progress Notes (Signed)
Patient declined phone call to family member for update. Stating she has already talked to them.

## 2019-03-28 ENCOUNTER — Inpatient Hospital Stay
Admit: 2019-03-28 | Discharge: 2019-03-28 | Disposition: A | Discharge status: Home / Self Care | Payer: Medicare Other | Attending: Internal Medicine | Admitting: Internal Medicine

## 2019-03-28 LAB — BASIC METABOLIC PANEL
Anion gap: 9 (ref 5–15)
BUN: 18 mg/dL (ref 8–23)
CO2: 32 mmol/L (ref 22–32)
Calcium: 8.8 mg/dL — ABNORMAL LOW (ref 8.9–10.3)
Chloride: 102 mmol/L (ref 98–111)
Creatinine, Ser: 0.84 mg/dL (ref 0.44–1.00)
GFR calc Af Amer: >60 mL/min (ref >60)
GFR calc non Af Amer: >60 mL/min (ref >60)
Glucose, Bld: 127 mg/dL — ABNORMAL HIGH (ref 70–99)
Potassium: 4.1 mmol/L (ref 3.5–5.1)
Sodium: 143 mmol/L (ref 135–145)

## 2019-03-28 LAB — PROCALCITONIN: Procalcitonin: <0.1 ng/mL

## 2019-03-28 LAB — MAGNESIUM: Magnesium: 1.8 mg/dL (ref 1.7–2.4)

## 2019-03-28 MED ORDER — DOXYCYCLINE HYCLATE 100 MG PO TABS: 100.0000 mg | ORAL_TABLET | Freq: Two times a day (BID) | ORAL | 0 refills | Status: AC

## 2019-03-28 MED ORDER — ALBUTEROL SULFATE HFA 108 (90 BASE) MCG/ACT IN AERS: 2.0000 | INHALATION_SPRAY | Freq: Four times a day (QID) | RESPIRATORY_TRACT | 0 refills | Status: AC | PRN

## 2019-03-28 NOTE — Discharge Summary (Signed)
[place] [facility] [place] NAME: Jessica Blackwell    MR#:  [id number]  DATE OF BIRTH:  17-Feb-1925  DATE OF ADMISSION:  03/26/2019 ADMITTING PHYSICIAN: Loletha Grayer, MD  DATE OF DISCHARGE:  03/28/19   PRIMARY CARE PHYSICIAN: Idelle Crouch, MD    ADMISSION DIAGNOSIS:  Acute diastolic congestive heart failure (HCC) [I50.31] Acute on chronic systolic congestive heart failure (HCC) [I50.23] Acute respiratory failure with hypoxia (HCC) [J96.01]  DISCHARGE DIAGNOSIS:  Community-acquired pneumonia Acute on chronic diastolic congestive heart failure  SECONDARY DIAGNOSIS:   Past Medical History:  Diagnosis Date  . Aortic stenosis   . Arthritis   . CHF (congestive heart failure) ([place])   . Fall at home 07/28/2015  . GERD (gastroesophageal reflux disease)   . H/O bladder infections   . Hypertension   . Neuropathy    lower extrmities    HOSPITAL COURSE:   HPI Jessica Blackwell  is a 85 y.o. female with a known history of CHF with aortic stenosis presents with swelling of the feet and legs and shortness of breath.  She has been feeling very weak.  She is short of breath especially when she is moving around.  She cannot get around like she used to.  She lives alone.  Some nausea.  She does get diarrhea often.  In the ER she had a chest x-ray that showed mild interstitial prominence could be pulmonary vascular congestion versus atypical infection.  She has new patchy opacities at the bases.  Hospitalist services contacted for further evaluation.   1. Acute hypoxic respiratory failure. Oxygen supplementation and wean off as tolerated  2. Acute on chronic diastolic congestive heart failure with aortic stenosis. Hold off on any afterload reduction. IV Lasix 40 mg twice daily. Continue beta-blocker. Family requested cardiology consultation. Patient sees Dr. Lorinda Creed as outpatient.  Seen by cardiology Dr. Clayborn Bigness recommending p.o. Lasix and  outpatient follow-up.  Cardiology to follow repeat echocardiogram results 3. Possibility of pneumonia.  Procalcitonin less than 0.10received Rocephin and doxycycline during the hospital course and will discharge home with p.o. doxycycline 4. Paroxysmal atrial fibrillation on amiodarone 5.  Nonsustained V. tach.  No other episodeS noticed.  Patient is asymptomatic.  Okay to discharge patient from cardiology standpoint outpatient follow-up with [facility] cardiology   6. Accelerated hypertension.  Improved   Generalized weakness PT is recommending home health PT and 24-hour supervision DISCHARGE CONDITIONS:  FAIR  CONSULTS OBTAINED:     PROCEDURES  NONE   DRUG ALLERGIES:   Allergies  Allergen Reactions  . Ciprofloxacin Itching and Rash  . Codeine Other (See Comments)    Upset stomach  . Sulfamethoxazole-Trimethoprim Rash    DISCHARGE MEDICATIONS:   Allergies as of 03/28/2019      Reactions   Ciprofloxacin Itching, Rash   Codeine Other (See Comments)   Upset stomach   Sulfamethoxazole-trimethoprim Rash      Medication List    STOP taking these medications   hydrOXYzine 25 MG tablet Commonly known as: ATARAX/VISTARIL   insulin aspart 100 UNIT/ML injection Commonly known as: novoLOG     TAKE these medications   acetaminophen 325 MG tablet Commonly known as: TYLENOL Take 2 tablets (650 mg total) by mouth every 6 (six) hours as needed for mild pain (or Fever >/= 101).   albuterol 108 (90 Base) MCG/ACT inhaler Commonly known as: VENTOLIN HFA Inhale 2 puffs into the lungs every 6 (six) hours as needed for wheezing or shortness of  breath.   amiodarone 200 MG tablet Commonly known as: PACERONE Take 1 tablet (200 mg total) by mouth daily. What changed: how much to take Notes to patient: Tomorrow at [place].    aspirin EC 81 MG tablet Take 81 mg by mouth daily. Notes to patient: Tomorrow at 9A.    cetirizine 10 MG tablet Commonly known as: ZYRTEC Take 10 mg by mouth  daily. Notes to patient: Today when home.    doxycycline 100 MG tablet Commonly known as: VIBRA-TABS Take 1 tablet (100 mg total) by mouth every 12 (twelve) hours for 5 days.   ferrous sulfate 325 (65 FE) MG tablet Take 325 mg by mouth daily.   furosemide 20 MG tablet Commonly known as: LASIX Take 20 mg by mouth 2 (two) times daily. What changed: Another medication with the same name was removed. Continue taking this medication, and follow the directions you see here.   gabapentin 300 MG capsule Commonly known as: NEURONTIN Take 300 mg by mouth 3 (three) times daily.   meloxicam 7.5 MG tablet Commonly known as: MOBIC Take 7.5 mg by mouth daily.   metFORMIN 500 MG tablet Commonly known as: GLUCOPHAGE Take 500 mg by mouth 2 (two) times daily with a meal.   metoprolol tartrate 25 MG tablet Commonly known as: LOPRESSOR Take 1 tablet (25 mg total) by mouth 2 (two) times daily.   pantoprazole 40 MG tablet Commonly known as: PROTONIX Take 40 mg by mouth 2 (two) times daily.   spironolactone 25 MG tablet Commonly known as: ALDACTONE Take 12.5 mg by mouth daily.        DISCHARGE INSTRUCTIONS:  Follow-up with primary care physician in 3 days Follow-up with cardiology Dr. Saralyn Pilar in 1 week  Daily weight monitoring, intake and output Outpatient CHF clinic follow-up in 2 days   DIET:  Cardiac diet and Diabetic diet  DISCHARGE CONDITION:  Fair  ACTIVITY:  Activity as tolerated  OXYGEN:  Home Oxygen: No.   Oxygen Delivery: room air  DISCHARGE LOCATION:  home   If you experience worsening of your admission symptoms, develop shortness of breath, life threatening emergency, suicidal or homicidal thoughts you must seek medical attention immediately by calling 911 or calling your MD immediately  if symptoms less severe.  You Must read complete instructions/literature along with all the possible adverse reactions/side effects for all the Medicines you take and that  have been prescribed to you. Take any new Medicines after you have completely understood and accpet all the possible adverse reactions/side effects.   Please note  You were cared for by a hospitalist during your hospital stay. If you have any questions about your discharge medications or the care you received while you were in the hospital after you are discharged, you can call the unit and asked to speak with the hospitalist on call if the hospitalist that took care of you is not available. Once you are discharged, your primary care physician will handle any further medical issues. Please note that NO REFILLS for any discharge medications will be authorized once you are discharged, as it is imperative that you return to your primary care physician (or establish a relationship with a primary care physician if you do not have one) for your aftercare needs so that they can reassess your need for medications and monitor your lab values.     Today  Chief Complaint  Patient presents with  . Shortness of Breath   Patient is feeling fine okay to discharge  patient from cardiology standpoint.  Repeat echocardiogram results are pending which need to be followed up by the cardiology ROS:  CONSTITUTIONAL: Denies fevers, chills. Denies any fatigue, weakness.  EYES: Denies blurry vision, double vision, eye pain. EARS, NOSE, THROAT: Denies tinnitus, ear pain, hearing loss. RESPIRATORY: Denies cough, wheeze, shortness of breath.  CARDIOVASCULAR: Denies chest pain, palpitations, edema.  GASTROINTESTINAL: Denies nausea, vomiting, diarrhea, abdominal pain. Denies bright red blood per rectum. GENITOURINARY: Denies dysuria, hematuria. ENDOCRINE: Denies nocturia or thyroid problems. HEMATOLOGIC AND LYMPHATIC: Denies easy bruising or bleeding. SKIN: Denies rash or lesion. MUSCULOSKELETAL: Denies pain in neck, back, shoulder, knees, hips or arthritic symptoms.  NEUROLOGIC: Denies paralysis, paresthesias.   PSYCHIATRIC: Denies anxiety or depressive symptoms.   VITAL SIGNS:  Blood pressure 140/65, pulse 63, temperature 97.7 F (36.5 C), resp. rate 18, height 5\' 1"  (1.549 m), weight 66.4 kg, SpO2 95 %.  I/O:    Intake/Output Summary (Last 24 hours) at 03/28/2019 1349 Last data filed at 03/27/2019 2225 Gross per 24 hour  Intake 90.79 ml  Output 1100 ml  Net -1009.21 ml    PHYSICAL EXAMINATION:  GENERAL:  85 y.o.-year-old patient lying in the bed with no acute distress.  EYES: Pupils equal, round, reactive to light and accommodation. No scleral icterus. Extraocular muscles intact.  HEENT: Head atraumatic, normocephalic. Oropharynx and nasopharynx clear.  NECK:  Supple, no jugular venous distention. No thyroid enlargement, no tenderness.  LUNGS: Moderate breath sounds bilaterally, no wheezing, rales,rhonchi or crepitation. No use of accessory muscles of respiration.  CARDIOVASCULAR: S1, S2 normal. No murmurs, rubs, or gallops.  ABDOMEN: Soft, non-tender, non-distended. Bowel sounds present. EXTREMITIES: No pedal edema, cyanosis, or clubbing.  NEUROLOGIC: Cranial nerves II through XII are intact. Muscle strength 5/5 in all extremities. Sensation intact. Gait not checked.  PSYCHIATRIC: The patient is alert and oriented x 3.  SKIN: No obvious rash, lesion, or ulcer.   DATA REVIEW:   CBC Recent Labs  Lab 03/26/19 1706  WBC 4.0  HGB 14.8  HCT 48.1*  PLT 66*    Chemistries  Recent Labs  Lab 03/28/19 0626  NA 143  K 4.1  CL 102  CO2 32  GLUCOSE 127*  BUN 18  CREATININE 0.84  CALCIUM 8.8*  MG 1.8    Cardiac Enzymes No results for input(s): TROPONINI in the last 168 hours.  Microbiology Results  Results for orders placed or performed during the hospital encounter of 03/26/19  SARS Coronavirus 2 (CEPHEID- Performed in [place] hospital lab), Hosp Order     Status: None   Collection Time: 03/26/19  5:06 PM   Specimen: Nasopharyngeal Swab  Result Value Ref Range  Status   SARS Coronavirus 2 NEGATIVE NEGATIVE Final    Comment: (NOTE) If result is NEGATIVE SARS-CoV-2 target nucleic acids are NOT DETECTED. The SARS-CoV-2 RNA is generally detectable in upper and lower  respiratory specimens during the acute phase of infection. The lowest  concentration of SARS-CoV-2 viral copies this assay can detect is 250  copies / mL. A negative result does not preclude SARS-CoV-2 infection  and should not be used as the sole basis for treatment or other  patient management decisions.  A negative result may occur with  improper specimen collection / handling, submission of specimen other  than nasopharyngeal swab, presence of viral mutation(s) within the  areas targeted by this assay, and inadequate number of viral copies  (<250 copies / mL). A negative result must be combined with clinical  observations, patient  history, and epidemiological information. If result is POSITIVE SARS-CoV-2 target nucleic acids are DETECTED. The SARS-CoV-2 RNA is generally detectable in upper and lower  respiratory specimens dur ing the acute phase of infection.  Positive  results are indicative of active infection with SARS-CoV-2.  Clinical  correlation with patient history and other diagnostic information is  necessary to determine patient infection status.  Positive results do  not rule out bacterial infection or co-infection with other viruses. If result is PRESUMPTIVE POSTIVE SARS-CoV-2 nucleic acids MAY BE PRESENT.   A presumptive positive result was obtained on the submitted specimen  and confirmed on repeat testing.  While 2019 novel coronavirus  (SARS-CoV-2) nucleic acids may be present in the submitted sample  additional confirmatory testing may be necessary for epidemiological  and / or clinical management purposes  to differentiate between  SARS-CoV-2 and other Sarbecovirus currently known to infect humans.  If clinically indicated additional testing with an alternate  test  methodology [phone]) is advised. The SARS-CoV-2 RNA is generally  detectable in upper and lower respiratory sp ecimens during the acute  phase of infection. The expected result is Negative. Fact Sheet for Patients:  StrictlyIdeas.no Fact Sheet for Healthcare Providers: BankingDealers.co.za This test is not yet approved or cleared by the [place] FDA and has been authorized for detection and/or diagnosis of SARS-CoV-2 by FDA under an Emergency Use Authorization (EUA).  This EUA will remain in effect (meaning this test can be used) for the duration of the COVID-19 declaration under Section 564(b)(1) of the Act, 21 U.S.C. section 360bbb-3(b)(1), unless the authorization is terminated or revoked sooner. Performed at [facility], [address] [place], [place] [address]     RADIOLOGY:  Dg Chest Portable 1 View  Result Date: 03/26/2019 CLINICAL DATA:  Shortness of breath.  Crackles. EXAM: PORTABLE CHEST 1 VIEW COMPARISON:  CT scan May 2014. Multiple chest x-rays since July 28, 2015. FINDINGS: The patient's known left pleural based mass seen on the CT scanner 2014 is similar in the interval measuring 3.3 cm today, unchanged since September 2018. Stable cardiomegaly. The cardiomediastinal silhouette is stable. The contour abnormality in the left side of the mediastinum is consistent with an enlarged pulmonary artery seen on previous CT imaging, similar in the interval. The findings suggest pulmonary arterial hypertension. No pneumothorax. Increased interstitial markings in the lungs. Patchy opacities in the bases. IMPRESSION: 1. New patchy opacities in the bases may represent atelectasis or developing infiltrate such as pneumonia or aspiration. Recommend clinical correlation and follow-up to resolution. 2. Mild increased interstitial prominence suggest pulmonary venous congestion versus atypical infection. 3. Stable left  pleural base mass. 4. Stable enlarged pulmonary artery consistent with pulmonary arterial hypertension. Electronically Signed   By: Dorise Bullion III M.D   On: 03/26/2019 17:47    EKG:   Orders placed or performed during the hospital encounter of 03/26/19  . ED EKG  . ED EKG  . EKG 12-Lead  . EKG 12-Lead      Management plans discussed with the patient, family and they are in agreement.  CODE STATUS:     Code Status Orders  (From admission, onward)         Start     Ordered   03/26/19 1946  Do not attempt resuscitation (DNR)  Continuous    Question Answer Comment  In the event of cardiac or respiratory ARREST Do not call a "code blue"   In the event of cardiac or respiratory ARREST Do  not perform Intubation, CPR, defibrillation or ACLS   In the event of cardiac or respiratory ARREST Use medication by any route, position, wound care, and other measures to relive pain and suffering. May use oxygen, suction and manual treatment of airway obstruction as needed for comfort.   Comments nurse may pronounce      03/26/19 1948        Code Status History    Date Active Date Inactive Code Status Order ID Comments User Context   05/21/2018 2003 05/26/2018 2013 Full Code [id number]  Fritzi Mandes, MD Inpatient   05/20/2018 1418 05/21/2018 2003 DNR [id number]  Loletha Grayer, MD ED   12/22/2016 2052 12/23/2016 1512 Full Code [id number]  Quintella Baton, MD Inpatient   07/28/2015 0638 08/02/2015 1844 Full Code [id number]  Norval Morton, MD Inpatient   Advance Care Planning Activity      TOTAL TIME TAKING CARE OF THIS PATIENT: 45 minutes.   Note: This dictation was prepared with Dragon dictation along with smaller phrase technology. Any transcriptional errors that result from this process are unintentional.   @[facility] @  on 03/28/2019 at 1:49 PM  Between 7am to 6pm - Pager - [phone]  After 6pm go to [email or web address] - password [email or web address] [place] [facility]  Office   [phone]  CC: Primary care physician; Idelle Crouch, MD

## 2019-03-28 NOTE — Discharge Instructions (Signed)
Follow-up with primary care physician in 3 days Follow-up with cardiology Dr. Saralyn Pilar in 1 week  Daily weight monitoring, intake and output Outpatient CHF clinic follow-up in 2 days

## 2019-03-28 NOTE — TOC Transition Note (Signed)
Transition of Care [facility]) - CM/SW Discharge Note   Patient Details  Name: Jessica Blackwell MRN: [id number] Date of Birth: Oct 27, 1924  [facility] CM/SW Contact:  Latanya Maudlin, RN Phone Number: 03/28/2019, 1:29 PM   Clinical Narrative:  Patient to be discharged per MD order. Orders in place for home health services. CMS Medicare.gov Compare Post Acute Care list reviewed with patient and she recently closed with [facility], she prefers to use them again. Notified Kendra from [facility] of referral. Patient has RW and all needed DME. Daughter to transport.           Patient Goals and CMS Choice   CMS Medicare.gov Compare Post Acute Care list provided to:: Patient Choice offered to / list presented to : Patient  Discharge Placement                       Discharge Plan and Services   Discharge Planning Services: CM Consult Post Acute Care Choice: Home Health                    HH Arranged: RN, PT, Nurse's Aide [facility] Agency: [facility] Date [facility] Agency Contacted: 03/28/19 Time [place]: [id number] Representative spoke with at [place]: [place] ([place]) Interventions     Readmission Risk Interventions No flowsheet data found.

## 2019-03-29 LAB — ECHOCARDIOGRAM COMPLETE
Height: 61 in
Weight: 2340.47 oz

## 2019-04-06 ENCOUNTER — Emergency Department: Payer: Medicare Other

## 2019-04-06 ENCOUNTER — Inpatient Hospital Stay
Admission: EM | Admit: 2019-04-06 | Discharge: 2019-04-10 | DRG: 689 | Disposition: A | Discharge status: Home Health | Payer: Medicare Other | Attending: Internal Medicine | Admitting: Internal Medicine

## 2019-04-06 ENCOUNTER — Other Ambulatory Visit: Payer: Self-pay

## 2019-04-06 DIAGNOSIS — N390 Urinary tract infection, site not specified: Secondary | ICD-10-CM | POA: Diagnosis present

## 2019-04-06 DIAGNOSIS — K5900 Constipation, unspecified: Secondary | ICD-10-CM | POA: Diagnosis present

## 2019-04-06 DIAGNOSIS — I110 Hypertensive heart disease with heart failure: Secondary | ICD-10-CM | POA: Diagnosis present

## 2019-04-06 DIAGNOSIS — Z20828 Contact with and (suspected) exposure to other viral communicable diseases: Secondary | ICD-10-CM | POA: Diagnosis present

## 2019-04-06 DIAGNOSIS — Z9181 History of falling: Secondary | ICD-10-CM

## 2019-04-06 DIAGNOSIS — R109 Unspecified abdominal pain: Secondary | ICD-10-CM

## 2019-04-06 DIAGNOSIS — M1990 Unspecified osteoarthritis, unspecified site: Secondary | ICD-10-CM | POA: Diagnosis present

## 2019-04-06 DIAGNOSIS — G629 Polyneuropathy, unspecified: Secondary | ICD-10-CM | POA: Diagnosis present

## 2019-04-06 DIAGNOSIS — J9621 Acute and chronic respiratory failure with hypoxia: Secondary | ICD-10-CM | POA: Diagnosis present

## 2019-04-06 DIAGNOSIS — R197 Diarrhea, unspecified: Secondary | ICD-10-CM | POA: Diagnosis present

## 2019-04-06 DIAGNOSIS — I480 Paroxysmal atrial fibrillation: Secondary | ICD-10-CM | POA: Diagnosis present

## 2019-04-06 DIAGNOSIS — Z90710 Acquired absence of both cervix and uterus: Secondary | ICD-10-CM

## 2019-04-06 DIAGNOSIS — N3000 Acute cystitis without hematuria: Principal | ICD-10-CM | POA: Diagnosis present

## 2019-04-06 DIAGNOSIS — Z881 Allergy status to other antibiotic agents status: Secondary | ICD-10-CM

## 2019-04-06 DIAGNOSIS — I5022 Chronic systolic (congestive) heart failure: Secondary | ICD-10-CM | POA: Diagnosis present

## 2019-04-06 DIAGNOSIS — Z9849 Cataract extraction status, unspecified eye: Secondary | ICD-10-CM

## 2019-04-06 DIAGNOSIS — Z885 Allergy status to narcotic agent status: Secondary | ICD-10-CM

## 2019-04-06 DIAGNOSIS — Z79899 Other long term (current) drug therapy: Secondary | ICD-10-CM

## 2019-04-06 DIAGNOSIS — Z7982 Long term (current) use of aspirin: Secondary | ICD-10-CM

## 2019-04-06 DIAGNOSIS — Z882 Allergy status to sulfonamides status: Secondary | ICD-10-CM

## 2019-04-06 DIAGNOSIS — Z66 Do not resuscitate: Secondary | ICD-10-CM | POA: Diagnosis present

## 2019-04-06 DIAGNOSIS — K219 Gastro-esophageal reflux disease without esophagitis: Secondary | ICD-10-CM | POA: Diagnosis present

## 2019-04-06 DIAGNOSIS — I350 Nonrheumatic aortic (valve) stenosis: Secondary | ICD-10-CM | POA: Diagnosis present

## 2019-04-06 DIAGNOSIS — Z791 Long term (current) use of non-steroidal anti-inflammatories (NSAID): Secondary | ICD-10-CM

## 2019-04-06 DIAGNOSIS — Z8249 Family history of ischemic heart disease and other diseases of the circulatory system: Secondary | ICD-10-CM

## 2019-04-06 DIAGNOSIS — D696 Thrombocytopenia, unspecified: Secondary | ICD-10-CM | POA: Diagnosis present

## 2019-04-06 DIAGNOSIS — Z7984 Long term (current) use of oral hypoglycemic drugs: Secondary | ICD-10-CM

## 2019-04-06 LAB — URINALYSIS, COMPLETE (UACMP) WITH MICROSCOPIC
Bilirubin Urine: NEGATIVE
Glucose, UA: NEGATIVE mg/dL
Ketones, ur: NEGATIVE mg/dL
Nitrite: POSITIVE — AB
Protein, ur: NEGATIVE mg/dL
Specific Gravity, Urine: 1.009 (ref 1.005–1.030)
Squamous Epithelial / HPF: >50 — ABNORMAL HIGH (ref 0–5)
pH: 6 (ref 5.0–8.0)

## 2019-04-06 LAB — COMPREHENSIVE METABOLIC PANEL
ALT: 10 U/L (ref 0–44)
AST: 14 U/L — ABNORMAL LOW (ref 15–41)
Albumin: 4 g/dL (ref 3.5–5.0)
Alkaline Phosphatase: 38 U/L (ref 38–126)
Anion gap: 10 (ref 5–15)
BUN: 20 mg/dL (ref 8–23)
CO2: 26 mmol/L (ref 22–32)
Calcium: 9 mg/dL (ref 8.9–10.3)
Chloride: 101 mmol/L (ref 98–111)
Creatinine, Ser: 1.02 mg/dL — ABNORMAL HIGH (ref 0.44–1.00)
GFR calc Af Amer: 55 mL/min — ABNORMAL LOW (ref >60)
GFR calc non Af Amer: 48 mL/min — ABNORMAL LOW (ref >60)
Glucose, Bld: 121 mg/dL — ABNORMAL HIGH (ref 70–99)
Potassium: 4.1 mmol/L (ref 3.5–5.1)
Sodium: 137 mmol/L (ref 135–145)
Total Bilirubin: 1.1 mg/dL (ref 0.3–1.2)
Total Protein: 6.6 g/dL (ref 6.5–8.1)

## 2019-04-06 LAB — CBC
HCT: 41.4 % (ref 36.0–46.0)
Hemoglobin: 12.8 g/dL (ref 12.0–15.0)
MCH: 26.8 pg (ref 26.0–34.0)
MCHC: 30.9 g/dL (ref 30.0–36.0)
MCV: 86.8 fL (ref 80.0–100.0)
Platelets: 67 10*3/uL — ABNORMAL LOW (ref 150–400)
RBC: 4.77 MIL/uL (ref 3.87–5.11)
RDW: 16.4 % — ABNORMAL HIGH (ref 11.5–15.5)
WBC: 5.7 10*3/uL (ref 4.0–10.5)
nRBC: 0 % (ref 0.0–0.2)

## 2019-04-06 LAB — BRAIN NATRIURETIC PEPTIDE: B Natriuretic Peptide: 337 pg/mL — ABNORMAL HIGH (ref 0.0–100.0)

## 2019-04-06 LAB — TROPONIN I (HIGH SENSITIVITY)
Troponin I (High Sensitivity): 12 ng/L (ref <18)
Troponin I (High Sensitivity): 12 ng/L (ref <18)

## 2019-04-06 MED ORDER — SODIUM CHLORIDE 0.9 % IV SOLN
1.0000 g | Freq: Once | INTRAVENOUS | Status: AC
  Administered 2019-04-06: 18:00:00 1 g via INTRAVENOUS
  Filled 2019-04-06: qty 10

## 2019-04-06 NOTE — H&P (Signed)
[facility] [place] NAME: Jessica Blackwell    MR#:  [id number]  DATE OF BIRTH:  Nov 09, 1924  DATE OF ADMISSION:  04/06/2019  PRIMARY CARE PHYSICIAN: Idelle Crouch, MD   REQUESTING/REFERRING PHYSICIAN: Arta Silence, MD  CHIEF COMPLAINT:   Chief Complaint  Patient presents with   Weakness    HISTORY OF PRESENT ILLNESS:  Jessica Blackwell  is a 85 y.o. female with a known history of EMS services from home after having an episode of weakness with collapse while walking to her vehicle for a cardiology appointment.  Patient reports she felt generally weak and collapsed when her son-in-law caught her.  She denies injury.  She denies hitting her head or other areas of her body.  She has noted increased weakness over the last 2 to 3 days with dysuria and urgency as well as mildly foul urine odor.  She has not noted fevers he has noted chills.  She denies nausea or vomiting.  She denies diarrhea.  She denies chest pain or increased shortness of breath.  However she was admitted 1 week ago for exacerbation CHF.  She was hypoxic with oxygen saturation 84% on room air.  She was placed on 2 L nasal cannula with increased to 96%.  Patient does not have home oxygen at this time.  BNP is 337.  Chest x-ray demonstrates no acute cardiopulmonary disease.  No evidence of rib fracture.  No effusions.  Urinalysis on arrival demonstrates positive nitrites, moderate leukocytes, many bacteria, 21-50 white blood cells.  She has been admitted to the hospitalist service for further management.  PAST MEDICAL HISTORY:   Past Medical History:  Diagnosis Date   Aortic stenosis    Arthritis    CHF (congestive heart failure) ([place])    Fall at home 07/28/2015   GERD (gastroesophageal reflux disease)    H/O bladder infections    Hypertension    Neuropathy    lower extrmities    PAST SURGICAL HISTORY:   Past Surgical History:  Procedure Laterality Date    ABDOMINAL HYSTERECTOMY     CATARACT EXTRACTION     COLON SURGERY     ESOPHAGOGASTRODUODENOSCOPY N/A 05/22/2018   Procedure: ESOPHAGOGASTRODUODENOSCOPY (EGD);  Surgeon: Virgel Manifold, MD;  Location: [facility] ENDOSCOPY;  Service: Endoscopy;  Laterality: N/A;   EYE SURGERY     FOOT SURGERY Right    ROTATOR CUFF REPAIR Left    TONSILLECTOMY      SOCIAL HISTORY:   Social History   Tobacco Use   Smoking status: Never Smoker   Smokeless tobacco: Never Used  Substance Use Topics   Alcohol use: No    FAMILY HISTORY:   Family History  Problem Relation Age of Onset   Hypertension Father    CAD Mother     DRUG ALLERGIES:   Allergies  Allergen Reactions   Ciprofloxacin Itching and Rash   Codeine Other (See Comments)    Upset stomach   Sulfamethoxazole-Trimethoprim Rash    REVIEW OF SYSTEMS:   Review of Systems  Constitutional: Positive for chills and malaise/fatigue. Negative for fever.  HENT: Negative for congestion, sinus pain and sore throat.   Eyes: Negative for blurred vision and double vision.  Respiratory: Negative for cough, shortness of breath and wheezing.   Cardiovascular: Negative for chest pain, orthopnea and leg swelling.  Gastrointestinal: Positive for abdominal pain and nausea. Negative for constipation, diarrhea, heartburn and vomiting.  Genitourinary: Positive for dysuria and urgency.  Negative for flank pain, frequency and hematuria.  Musculoskeletal: Positive for falls. Negative for joint pain and myalgias.  Skin: Negative for itching and rash.  Neurological: Negative for dizziness, focal weakness and headaches.  Psychiatric/Behavioral: Negative.  Negative for depression.    MEDICATIONS AT HOME:   Prior to Admission medications   Medication Sig Start Date End Date Taking? Authorizing Provider  amiodarone (PACERONE) 200 MG tablet Take 1 tablet (200 mg total) by mouth daily. Patient taking differently: Take 100 mg by mouth daily.   05/27/18  Yes Gouru, Illene Silver, MD  aspirin EC 81 MG tablet Take 81 mg by mouth daily.   Yes [provider]  cetirizine (ZYRTEC) 10 MG tablet Take 10 mg by mouth daily.   Yes [provider]  ferrous sulfate 325 (65 FE) MG tablet Take 325 mg by mouth daily.   Yes [provider]  furosemide (LASIX) 20 MG tablet Take 20 mg by mouth 2 (two) times daily.   Yes [provider]  gabapentin (NEURONTIN) 300 MG capsule Take 300 mg by mouth 3 (three) times daily.   Yes [provider]  metFORMIN (GLUCOPHAGE) 500 MG tablet Take 500 mg by mouth 2 (two) times daily with a meal.   Yes [provider]  metoprolol tartrate (LOPRESSOR) 25 MG tablet Take 1 tablet (25 mg total) by mouth 2 (two) times daily. 05/26/18  Yes Gouru, Illene Silver, MD  pantoprazole (PROTONIX) 40 MG tablet Take 40 mg by mouth 2 (two) times daily.    Yes [provider]  spironolactone (ALDACTONE) 25 MG tablet Take 12.5 mg by mouth daily.   Yes [provider]  acetaminophen (TYLENOL) 325 MG tablet Take 2 tablets (650 mg total) by mouth every 6 (six) hours as needed for mild pain (or Fever >/= 101). 05/26/18   Gouru, Illene Silver, MD  albuterol (VENTOLIN HFA) 108 (90 Base) MCG/ACT inhaler Inhale 2 puffs into the lungs every 6 (six) hours as needed for wheezing or shortness of breath. 03/28/19   Gouru, Illene Silver, MD  meloxicam (MOBIC) 7.5 MG tablet Take 7.5 mg by mouth daily.    [provider]      VITAL SIGNS:  Blood pressure 135/65, pulse (!) 59, temperature 98.5 F (36.9 C), temperature source Oral, resp. rate 16, height 5\' 1"  (1.549 m), weight 66.2 kg, SpO2 96 %.  PHYSICAL EXAMINATION:  Physical Exam  GENERAL:  85 y.o.-year-old patient lying in the bed with no acute distress.  EYES: Pupils equal, round, reactive to light and accommodation. No scleral icterus. Extraocular muscles intact.  HEENT: Head atraumatic, normocephalic. Oropharynx and nasopharynx clear.  NECK:  Supple,  no jugular venous distention. No thyroid enlargement, no tenderness.  LUNGS: Normal breath sounds bilaterally, no wheezing, rales,rhonchi or crepitation. No use of accessory muscles of respiration.  CARDIOVASCULAR: Regular rate and rhythm, S1, S2 normal. No murmurs, rubs, or gallops.  ABDOMEN: Soft, nondistended, lower abdominal tenderness present. Bowel sounds present. No organomegaly or mass.  EXTREMITIES: No pedal edema, cyanosis, or clubbing.  NEUROLOGIC: Cranial nerves II through XII are intact.  Generalized weakness. Sensation intact. Gait not checked.  PSYCHIATRIC: The patient is alert and oriented x 3.  Normal affect and good eye contact. SKIN: No obvious rash, lesion, or ulcer.   LABORATORY PANEL:   CBC Recent Labs  Lab 04/06/19 1500  WBC 5.7  HGB 12.8  HCT 41.4  PLT 67*   ------------------------------------------------------------------------------------------------------------------  Chemistries  Recent Labs  Lab 04/06/19 1500  NA 137  K  4.1  CL 101  CO2 26  GLUCOSE 121*  BUN 20  CREATININE 1.02*  CALCIUM 9.0  AST 14*  ALT 10  ALKPHOS 38  BILITOT 1.1   ------------------------------------------------------------------------------------------------------------------  Cardiac Enzymes No results for input(s): TROPONINI in the last 168 hours. ------------------------------------------------------------------------------------------------------------------  RADIOLOGY:  Dg Chest 2 View  Result Date: 04/06/2019 CLINICAL DATA:  Hypoxia. Weakness. The patient fell while walking to her car. EXAM: CHEST - 2 VIEW COMPARISON:  Chest x-rays dated 03/26/2019 and 05/21/2018 and 07/28/2015 and CT scan dated 01/15/2013 FINDINGS: There is stable cardiomegaly. There is chronic massive enlargement of the main pulmonary artery and of the right main pulmonary artery. There is chronic scarring at the lung bases. 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 No effusions. No appreciable acute bone abnormality. IMPRESSION: 1. No acute cardiopulmonary disease. 2. Chronic massive enlargement of the main pulmonary artery and of the right main pulmonary artery. 3. 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 4. No acute rib fracture or bone destruction. Electronically Signed   By: Lorriane Shire M.D.   On: 04/06/2019 16:10      IMPRESSION AND PLAN:   1.  UTI - IV Rocephin - Urine culture pending -Will adjust treatment based on culture results  2.  Generalized weakness - We will consult physical therapy for supportive care  3.  Chronic CHF - With hypoxia on arrival - No evidence of exacerbation based on labs and chest x-ray - Patient is not currently on home oxygen therapy however she will likely need home oxygen therapy at the time of discharge therefore social service has been consulted for assistance -Beta-blocker continued  4.  Neuropathy -Neurontin continued  5.  GERD -PPI continued  DVT and PPI prophylaxis    All the records are reviewed and case discussed with ED provider. The plan of care was discussed in details with the patient (and family). I answered all questions. The patient agreed to proceed with the above mentioned plan. Further management will depend upon hospital course.   CODE STATUS: Full code  TOTAL TIME TAKING CARE OF THIS PATIENT: 45 minutes.    [place] on 04/06/2019 at 7:01 PM  Pager - [phone]  After 6pm go to [email or web address] - Proofreader  [facility]  Office  [phone]  CC: Primary care physician; Idelle Crouch, MD   Note: This dictation was prepared with Dragon dictation along with smaller phrase technology. Any transcriptional errors that result from this process are unintentional.

## 2019-04-06 NOTE — ED Notes (Signed)
Dr Cherylann Banas requested to see pt's RA O2 sats

## 2019-04-06 NOTE — ED Notes (Signed)
Pt refused to have covid swab because it "hurts too bad"- Dr Cherylann Banas aware

## 2019-04-06 NOTE — ED Notes (Addendum)
Pt placed on bedpan

## 2019-04-06 NOTE — ED Notes (Signed)
Pt given meal tray and diet coke at this time °

## 2019-04-06 NOTE — ED Notes (Signed)
Pt taken to xray 

## 2019-04-06 NOTE — ED Notes (Signed)
Pt refusing covid swab at this time. Marya Amsler charge RN made aware. NP made aware.

## 2019-04-06 NOTE — ED Notes (Signed)
ED TO INPATIENT HANDOFF REPORT  ED Nurse Name and Phone #: gracie  S Name/Age/Gender Jessica Blackwell 85 y.o. female Room/Bed: ED06A/ED06A  Code Status   Code Status: Prior  Home/SNF/Other Home Patient oriented to: self, place, time and situation Is this baseline? Yes   Triage Complete: Triage complete  Chief Complaint weakness  Triage Note Pt arrives via [facility] after having weakness in her legs that made her fall to the ground while walking to her car for a cardiology appt- pt recently diagnosed with CHF- pt o2 at 84% on RA- placed on 2L [place] and now at 96%   Allergies Allergies  Allergen Reactions  . Ciprofloxacin Itching and Rash  . Codeine Other (See Comments)    Upset stomach  . Sulfamethoxazole-Trimethoprim Rash    Level of Care/Admitting Diagnosis ED Disposition    ED Disposition Condition [facility]: [place] [[id number]]  Level of Care: Med-Surg [16]  Covid Evaluation: Asymptomatic Screening Protocol (No Symptoms)  Diagnosis: UTI (urinary tract infection) [409811]  Admitting Physician: Mayer Camel [[id number]]  Attending Physician: Mayer Camel [phone]  PT Class (Do Not Modify): Observation [104]  PT Acc Code (Do Not Modify): Observation [10022]       B Medical/Surgery History Past Medical History:  Diagnosis Date  . Aortic stenosis   . Arthritis   . CHF (congestive heart failure) ([place])   . Fall at home 07/28/2015  . GERD (gastroesophageal reflux disease)   . H/O bladder infections   . Hypertension   . Neuropathy    lower extrmities   Past Surgical History:  Procedure Laterality Date  . ABDOMINAL HYSTERECTOMY    . CATARACT EXTRACTION    . COLON SURGERY    . ESOPHAGOGASTRODUODENOSCOPY N/A 05/22/2018   Procedure: ESOPHAGOGASTRODUODENOSCOPY (EGD);  Surgeon: Virgel Manifold, MD;  Location: [facility] ENDOSCOPY;  Service: Endoscopy;  Laterality: N/A;  . EYE SURGERY    . FOOT SURGERY Right   . ROTATOR  CUFF REPAIR Left   . TONSILLECTOMY       A IV Location/Drains/Wounds Patient Lines/Drains/Airways Status   Active Line/Drains/Airways    Name:   Placement date:   Placement time:   Site:   Days:   Peripheral IV 04/06/19 Left Forearm   04/06/19    1537    Forearm   less than 1          Intake/Output Last 24 hours No intake or output data in the 24 hours ending 04/06/19 2320  Labs/Imaging Results for orders placed or performed during the hospital encounter of 04/06/19 (from the past 48 hour(s))  CBC     Status: Abnormal   Collection Time: 04/06/19  3:00 PM  Result Value Ref Range   WBC 5.7 4.0 - 10.5 K/uL   RBC 4.77 3.87 - 5.11 MIL/uL   Hemoglobin 12.8 12.0 - 15.0 g/dL   HCT 41.4 36.0 - 46.0 %   MCV 86.8 80.0 - 100.0 fL   MCH 26.8 26.0 - 34.0 pg   MCHC 30.9 30.0 - 36.0 g/dL   RDW 16.4 (H) 11.5 - 15.5 %   Platelets 67 (L) 150 - 400 K/uL   nRBC 0.0 0.0 - 0.2 %    Comment: Performed at [facility], [address] [place], [place] [address]  Comprehensive metabolic panel     Status: Abnormal   Collection Time: 04/06/19  3:00 PM  Result Value Ref Range   Sodium 137 135 -  145 mmol/L   Potassium 4.1 3.5 - 5.1 mmol/L   Chloride 101 98 - 111 mmol/L   CO2 26 22 - 32 mmol/L   Glucose, Bld 121 (H) 70 - 99 mg/dL   BUN 20 8 - 23 mg/dL   Creatinine, Ser 1.02 (H) 0.44 - 1.00 mg/dL   Calcium 9.0 8.9 - 10.3 mg/dL   Total Protein 6.6 6.5 - 8.1 g/dL   Albumin 4.0 3.5 - 5.0 g/dL   AST 14 (L) 15 - 41 U/L   ALT 10 0 - 44 U/L   Alkaline Phosphatase 38 38 - 126 U/L   Total Bilirubin 1.1 0.3 - 1.2 mg/dL   GFR calc non Af Amer 48 (L) >60 mL/min   GFR calc Af Amer 55 (L) >60 mL/min   Anion gap 10 5 - 15    Comment: Performed at [facility], [place], [place] [address]  Troponin I (High Sensitivity)     Status: None   Collection Time: 04/06/19  3:00 PM  Result Value Ref Range   Troponin I (High Sensitivity) 12 <18 ng/L    Comment: (NOTE) Elevated  high sensitivity troponin I (hsTnI) values and significant  changes across serial measurements may suggest ACS but many other  chronic and acute conditions are known to elevate hsTnI results.  Refer to the "Links" section for chest pain algorithms and additional  guidance. Performed at [facility], [address] [place], [place] [address]   Brain natriuretic peptide     Status: Abnormal   Collection Time: 04/06/19  3:00 PM  Result Value Ref Range   B Natriuretic Peptide 337.0 (H) 0.0 - 100.0 pg/mL    Comment: Performed at [facility], [address] [place], [place] [address]  Urinalysis, Complete w Microscopic     Status: Abnormal   Collection Time: 04/06/19  5:02 PM  Result Value Ref Range   Color, Urine YELLOW (A) YELLOW   APPearance TURBID (A) CLEAR   Specific Gravity, Urine 1.009 1.005 - 1.030   pH 6.0 5.0 - 8.0   Glucose, UA NEGATIVE NEGATIVE mg/dL   Hgb urine dipstick SMALL (A) NEGATIVE   Bilirubin Urine NEGATIVE NEGATIVE   Ketones, ur NEGATIVE NEGATIVE mg/dL   Protein, ur NEGATIVE NEGATIVE mg/dL   Nitrite POSITIVE (A) NEGATIVE   Leukocytes,Ua MODERATE (A) NEGATIVE   RBC / HPF 6-10 0 - 5 RBC/hpf   WBC, UA 21-50 0 - 5 WBC/hpf   Bacteria, UA MANY (A) NONE SEEN   Squamous Epithelial / LPF >50 (H) 0 - 5   Budding Yeast PRESENT    Non Squamous Epithelial PRESENT (A) NONE SEEN    Comment: Performed at [facility], [place], [place] [address]  Troponin I (High Sensitivity)     Status: None   Collection Time: 04/06/19  5:07 PM  Result Value Ref Range   Troponin I (High Sensitivity) 12 <18 ng/L    Comment: (NOTE) Elevated high sensitivity troponin I (hsTnI) values and significant  changes across serial measurements may suggest ACS but many other  chronic and acute conditions are known to elevate hsTnI results.  Refer to the "Links" section for chest pain algorithms and additional  guidance. Performed at [facility], [address] [place], [place] [address]    Dg Chest 2 View  Result Date: 04/06/2019 CLINICAL DATA:  Hypoxia. Weakness. The patient fell while walking to her car. EXAM: CHEST - 2 VIEW  COMPARISON:  Chest x-rays dated 03/26/2019 and 05/21/2018 and 07/28/2015 and CT scan dated 01/15/2013 FINDINGS: There is stable cardiomegaly. There is chronic massive enlargement of the main pulmonary artery and of the right main pulmonary artery. There is chronic scarring at the lung bases. 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 No effusions. No appreciable acute bone abnormality. IMPRESSION: 1. No acute cardiopulmonary disease. 2. Chronic massive enlargement of the main pulmonary artery and of the right main pulmonary artery. 3. 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 4. No acute rib fracture or bone destruction. Electronically Signed   By: Lorriane Shire M.D.   On: 04/06/2019 16:10    Pending Labs Unresulted Labs (From admission, onward)    Start     Ordered   04/06/19 1536  SARS Coronavirus 2 (CEPHEID - Performed in [place] hospital lab), Hosp Order  (Asymptomatic Patients Labs)  Once,   STAT    Question:  Rule Out  Answer:  Yes   04/06/19 1535   Signed and Held  CBC  (enoxaparin (LOVENOX)    CrCl >/= 30 ml/min)  Once,   R    Comments: Baseline for enoxaparin therapy IF NOT ALREADY DRAWN.  Notify MD if PLT < 100 K.    Signed and Held   Signed and Held  Creatinine, serum  (enoxaparin (LOVENOX)    CrCl >/= 30 ml/min)  Once,   R    Comments: Baseline for enoxaparin therapy IF NOT ALREADY DRAWN.    Signed and Held   Signed and Held  Creatinine, serum  (enoxaparin (LOVENOX)    CrCl >/= 30 ml/min)  Weekly,   R    Comments: while on enoxaparin therapy    Signed and Held   Signed and Held  TSH  Once,   R     Signed and Held   Signed and Held  Basic metabolic panel  Tomorrow  morning,   R     Signed and Held   Signed and Held  CBC  Tomorrow morning,   R     Signed and Held   Signed and Held  Urine culture  Once,   R    Question:  Patient immune status  Answer:  Normal   Signed and Held          Vitals/Pain Today's Vitals   04/06/19 1900 04/06/19 1930 04/06/19 1942 04/06/19 2030  BP: 126/61 131/68  (!) 141/72  Pulse:  (!) 59    Resp: 16 18  (!) 21  Temp:      TempSrc:      SpO2:  97%    Weight:      Height:      PainSc:   0-No pain     Isolation Precautions No active isolations  Medications Medications  cefTRIAXone (ROCEPHIN) 1 g in sodium chloride 0.9 % 100 mL IVPB (0 g Intravenous Stopped 04/06/19 1858)    Mobility walks with device Low fall risk   Focused Assessments GU   R Recommendations: See Admitting Provider Note  Report given to:   Additional Notes:

## 2019-04-06 NOTE — ED Notes (Signed)
Pt O2 between 85-88% on RA

## 2019-04-06 NOTE — ED Notes (Signed)
Dr Cherylann Banas aware of RA O2 sats and requested her back on 2L O2

## 2019-04-06 NOTE — ED Provider Notes (Signed)
[facility] Emergency Department Provider Note ____________________________________________   First MD Initiated Contact with Patient 04/06/19 1518     (approximate)  I have reviewed the triage vital signs and the nursing notes.   HISTORY  Chief Complaint Weakness    HPI Jessica Blackwell is a 85 y.o. female with PMH as noted below who presents with generalized weakness since she left the hospital about a week ago, acutely worsened today with the patient feeling like her legs were giving out on her.  She states that her legs are slightly swollen but not significantly so.  She denies significant shortness of breath from her baseline.  Because of the weakness, she fell while walking to her car to go to a cardiology appointment.  Per EMS, the patient had an O2 saturation of 84% on room air at home.  She is not on home O2 normally.  Past Medical History:  Diagnosis Date   Aortic stenosis    Arthritis    CHF (congestive heart failure) ([place])    Fall at home 07/28/2015   GERD (gastroesophageal reflux disease)    H/O bladder infections    Hypertension    Neuropathy    lower extrmities    Patient Active Problem List   Diagnosis Date Noted   CHF (congestive heart failure) ([place]) 03/26/2019   Non-intractable vomiting with nausea    LOC (loss of consciousness) ([place]) 12/22/2016   AKI (acute kidney injury) ([place]) 08/01/2015   V-tach ([place]) 08/01/2015   NSVT (nonsustained ventricular tachycardia) ([place])    Persistent atrial fibrillation    Acute systolic congestive heart failure ([place]) 07/31/2015   Acute respiratory failure with hypoxia ([place]) 07/28/2015   Fall at home 07/28/2015   GERD (gastroesophageal reflux disease) 07/28/2015   HTN (hypertension) 07/28/2015   Anemia 07/28/2015    Past Surgical History:  Procedure Laterality Date   ABDOMINAL HYSTERECTOMY     CATARACT EXTRACTION     COLON SURGERY     ESOPHAGOGASTRODUODENOSCOPY N/A  05/22/2018   Procedure: ESOPHAGOGASTRODUODENOSCOPY (EGD);  Surgeon: Virgel Manifold, MD;  Location: [facility] ENDOSCOPY;  Service: Endoscopy;  Laterality: N/A;   EYE SURGERY     FOOT SURGERY Right    ROTATOR CUFF REPAIR Left    TONSILLECTOMY      Prior to Admission medications   Medication Sig Start Date End Date Taking? Authorizing Provider  amiodarone (PACERONE) 200 MG tablet Take 1 tablet (200 mg total) by mouth daily. Patient taking differently: Take 100 mg by mouth daily.  05/27/18  Yes Gouru, Illene Silver, MD  aspirin EC 81 MG tablet Take 81 mg by mouth daily.   Yes [provider]  cetirizine (ZYRTEC) 10 MG tablet Take 10 mg by mouth daily.   Yes [provider]  ferrous sulfate 325 (65 FE) MG tablet Take 325 mg by mouth daily.   Yes [provider]  furosemide (LASIX) 20 MG tablet Take 20 mg by mouth 2 (two) times daily.   Yes [provider]  gabapentin (NEURONTIN) 300 MG capsule Take 300 mg by mouth 3 (three) times daily.   Yes [provider]  metFORMIN (GLUCOPHAGE) 500 MG tablet Take 500 mg by mouth 2 (two) times daily with a meal.   Yes [provider]  metoprolol tartrate (LOPRESSOR) 25 MG tablet Take 1 tablet (25 mg total) by mouth 2 (two) times daily. 05/26/18  Yes Gouru, Aruna, MD  pantoprazole (PROTONIX) 40 MG tablet Take 40 mg by mouth 2 (two)  times daily.    Yes [provider]  spironolactone (ALDACTONE) 25 MG tablet Take 12.5 mg by mouth daily.   Yes [provider]  acetaminophen (TYLENOL) 325 MG tablet Take 2 tablets (650 mg total) by mouth every 6 (six) hours as needed for mild pain (or Fever >/= 101). 05/26/18   Gouru, Illene Silver, MD  albuterol (VENTOLIN HFA) 108 (90 Base) MCG/ACT inhaler Inhale 2 puffs into the lungs every 6 (six) hours as needed for wheezing or shortness of breath. 03/28/19   Gouru, Illene Silver, MD  meloxicam (MOBIC) 7.5 MG tablet Take 7.5 mg by mouth daily.    [provider]     Allergies Ciprofloxacin, Codeine, and Sulfamethoxazole-trimethoprim  Family History  Problem Relation Age of Onset   Hypertension Father    CAD Mother     Social History Social History   Tobacco Use   Smoking status: Never Smoker   Smokeless tobacco: Never Used  Substance Use Topics   Alcohol use: No   Drug use: No    Review of Systems  Constitutional: No fever.  Positive for generalized weakness. Eyes: No redness. ENT: No sore throat. Cardiovascular: Denies chest pain. Respiratory: Denies acute shortness of breath. Gastrointestinal: No vomiting or diarrhea.  Genitourinary: Negative for dysuria.  Musculoskeletal: Negative for back pain. Skin: Negative for rash. Neurological: Negative for headache.   ____________________________________________   PHYSICAL EXAM:  VITAL SIGNS: ED Triage Vitals  Enc Vitals Group     BP 04/06/19 1459 125/89     Pulse Rate 04/06/19 1459 62     Resp 04/06/19 1459 20     Temp 04/06/19 1459 98.5 F (36.9 C)     Temp Source 04/06/19 1459 Oral     SpO2 04/06/19 1459 96 %     Weight 04/06/19 1500 146 lb (66.2 kg)     Height 04/06/19 1500 5\' 1"  (1.549 m)     Head Circumference --      Peak Flow --      Pain Score 04/06/19 1500 0     Pain Loc --      Pain Edu? --      Excl. in [place]? --     Constitutional: Alert and oriented.  Somewhat weak and frail appearing but in no acute distress. Eyes: Conjunctivae are normal.  Head: Atraumatic. Nose: No congestion/rhinnorhea. Mouth/Throat: Mucous membranes are moist.   Neck: Normal range of motion.  Cardiovascular: Normal rate, regular rhythm. Grossly normal heart sounds.  Good peripheral circulation. Respiratory: Normal respiratory effort.  No retractions. Lungs CTAB. Gastrointestinal: Soft and nontender. No distention.  Genitourinary: No flank tenderness. Musculoskeletal: Trace bilateral lower extremity edema.  2+ DP pulses bilaterally.  Extremities warm and well perfused.   Neurologic:  Normal speech and language. 4/5 motor strength to bilateral lower extremities proximally.  Sensation intact.  Skin:  Skin is warm and dry. No rash noted. Psychiatric: Mood and affect are normal. Speech and behavior are normal.  ____________________________________________   LABS (all labs ordered are listed, but only abnormal results are displayed)  Labs Reviewed  CBC - Abnormal; Notable for the following components:      Result Value   RDW 16.4 (*)    Platelets 67 (*)    All other components within normal limits  COMPREHENSIVE METABOLIC PANEL - Abnormal; Notable for the following components:   Glucose, Bld 121 (*)    Creatinine, Ser 1.02 (*)    AST 14 (*)    GFR calc non Af Wyvonnia Lora  48 (*)    GFR calc Af Amer 55 (*)    All other components within normal limits  URINALYSIS, COMPLETE (UACMP) WITH MICROSCOPIC - Abnormal; Notable for the following components:   Color, Urine YELLOW (*)    APPearance TURBID (*)    Hgb urine dipstick SMALL (*)    Nitrite POSITIVE (*)    Leukocytes,Ua MODERATE (*)    Bacteria, UA MANY (*)    Squamous Epithelial / LPF >50 (*)    Non Squamous Epithelial PRESENT (*)    All other components within normal limits  BRAIN NATRIURETIC PEPTIDE - Abnormal; Notable for the following components:   B Natriuretic Peptide 337.0 (*)    All other components within normal limits  SARS CORONAVIRUS 2 (HOSPITAL ORDER, [place] [facility])  TROPONIN I (HIGH SENSITIVITY)  TROPONIN I (HIGH SENSITIVITY)   ____________________________________________  EKG  ED ECG REPORT I, Arta Silence, the attending physician, personally viewed and interpreted this ECG.  Date: 04/06/2019 EKG Time: 1459 Rate: 63 Rhythm: normal sinus rhythm QRS Axis: normal Intervals: Nonspecific IVCD ST/T Wave abnormalities: Nonspecific abnormalities Narrative Interpretation: no evidence of acute ischemia; no change when compared to EKG of  03/26/2019  ____________________________________________  RADIOLOGY  CXR: No focal infiltrate or acute edema  ____________________________________________   PROCEDURES  Procedure(s) performed: No  Procedures  Critical Care performed: No ____________________________________________   INITIAL IMPRESSION / ASSESSMENT AND PLAN / ED COURSE  Pertinent labs & imaging results that were available during my care of the patient were reviewed by me and considered in my medical decision making (see chart for details).  85 year old female with PMH of those noted below including CHF as well as a recent admission for CHF exacerbation and pneumonia presents with worsening generalized weakness since she left the hospital, acutely worse today, causing her to fall after she felt like her legs gave out on her.  She denies specific worsening shortness of breath and has no fever.  She did have one episode of vomiting yesterday but does not feel nauseous today and has no abdominal pain.  I reviewed the past medical records in [place]. The patient was discharged on 03/28/2019 with a diagnosis of acute CHF and community-acquired pneumonia.  Per the ED note from the prior admission, she apparently normally runs with an O2 saturation in the high 80s on room air.  On exam today, the patient is somewhat weak and frail appearing but in no distress.  She has trace bilateral lower extremity edema.  The lungs are clear.  The remainder of the exam is unremarkable.  Her EKG shows no acute changes.  Overall I am most concerned for recurrent CHF exacerbation, pneumonia, deconditioning related to her recent admission, dehydration or other metabolic etiology, or less likely ACS.  Episode today is not consistent with TIA/CVA given bilateral weakness and generalized symptoms. We will obtain chest x-ray, lab work-up, and reassess.  ----------------------------------------- 6:07 PM on  04/06/2019 -----------------------------------------  Chest x-ray shows no focal infiltrate or acute edema.  We took the patient off O2 briefly and she does go down to the low to mid 80s which is below her baseline.  However she has no acute respiratory symptoms.  Work-up is significant for a urinalysis consistent with UTI, which I suspect is the primary acute issue causing her weakness.  Prior cultures show Klebsiella which is sensitive to cephalosporins so I will order ceftriaxone.  Given the patient's weakness and hypoxia she will require admission.  I signed her  out to the hospitalist.  The patient declines COVID-19 testing at this time.  She has no fever cough, or other symptoms to suggest COVID-19 infection and she was negative 10 days ago.  __________________________  [name] was evaluated in Emergency Department on 04/06/2019 for the symptoms described in the history of present illness. She was evaluated in the context of the global COVID-19 pandemic, which necessitated consideration that the patient might be at risk for infection with the SARS-CoV-2 virus that causes COVID-19. Institutional protocols and algorithms that pertain to the evaluation of patients at risk for COVID-19 are in a state of rapid change based on information released by regulatory bodies including the CDC and federal and state organizations. These policies and algorithms were followed during the patient's care in the ED. ____________________________________________   FINAL CLINICAL IMPRESSION(S) / ED DIAGNOSES  Final diagnoses:  Urinary tract infection without hematuria, site unspecified  Acute on chronic respiratory failure with hypoxia (HCC)      NEW MEDICATIONS STARTED DURING THIS VISIT:  New Prescriptions   No medications on file     Note:  This document was prepared using Dragon voice recognition software and may include unintentional dictation errors.    Arta Silence, MD 04/06/19  [phone]

## 2019-04-06 NOTE — ED Notes (Signed)
EDP at bedside  

## 2019-04-06 NOTE — ED Triage Notes (Signed)
Pt arrives via [facility] after having weakness in her legs that made her fall to the ground while walking to her car for a cardiology appt- pt recently diagnosed with CHF- pt o2 at 84% on RA- placed on 2L [place] and now at 96%

## 2019-04-06 NOTE — Progress Notes (Signed)
Advanced care plan. Purpose of the Encounter: CODE STATUS Parties in Attendance:Patient Patient's Decision Capacity:Good Subjective/Patient's story: 85 year old elderly female patient with history of chronic congestive heart failure, aortic stenosis, neuropathy, hypertension, GERD presented to the emergency room for shortness of breath and generalized weakness Objective/Medical story Patient evaluated in the emergency room.  Has urinary tract infection.  Troponin is not elevated.  Chest x-ray showed no fluid overload.  Patient needs antibiotics for UTI and supportive care. Goals of care determination:  Advance care directives goals of care treatment plan discussed Patient does not want CPR, intubation CODE STATUS: DNR Time spent discussing advanced care planning: 16 minutes

## 2019-04-06 NOTE — ED Notes (Addendum)
Admitting at bedside.   Called dietary to have supper tray brought to ED.

## 2019-04-07 ENCOUNTER — Observation Stay: Payer: Medicare Other

## 2019-04-07 ENCOUNTER — Other Ambulatory Visit: Payer: Self-pay

## 2019-04-07 DIAGNOSIS — I480 Paroxysmal atrial fibrillation: Secondary | ICD-10-CM | POA: Diagnosis present

## 2019-04-07 DIAGNOSIS — Z20828 Contact with and (suspected) exposure to other viral communicable diseases: Secondary | ICD-10-CM | POA: Diagnosis present

## 2019-04-07 DIAGNOSIS — Z7984 Long term (current) use of oral hypoglycemic drugs: Secondary | ICD-10-CM | POA: Diagnosis not present

## 2019-04-07 DIAGNOSIS — K219 Gastro-esophageal reflux disease without esophagitis: Secondary | ICD-10-CM | POA: Diagnosis present

## 2019-04-07 DIAGNOSIS — D696 Thrombocytopenia, unspecified: Secondary | ICD-10-CM | POA: Diagnosis present

## 2019-04-07 DIAGNOSIS — M1990 Unspecified osteoarthritis, unspecified site: Secondary | ICD-10-CM | POA: Diagnosis present

## 2019-04-07 DIAGNOSIS — Z66 Do not resuscitate: Secondary | ICD-10-CM | POA: Diagnosis present

## 2019-04-07 DIAGNOSIS — R197 Diarrhea, unspecified: Secondary | ICD-10-CM | POA: Diagnosis present

## 2019-04-07 DIAGNOSIS — G629 Polyneuropathy, unspecified: Secondary | ICD-10-CM | POA: Diagnosis present

## 2019-04-07 DIAGNOSIS — Z791 Long term (current) use of non-steroidal anti-inflammatories (NSAID): Secondary | ICD-10-CM | POA: Diagnosis not present

## 2019-04-07 DIAGNOSIS — I5022 Chronic systolic (congestive) heart failure: Secondary | ICD-10-CM | POA: Diagnosis present

## 2019-04-07 DIAGNOSIS — Z79899 Other long term (current) drug therapy: Secondary | ICD-10-CM | POA: Diagnosis not present

## 2019-04-07 DIAGNOSIS — Z90710 Acquired absence of both cervix and uterus: Secondary | ICD-10-CM | POA: Diagnosis not present

## 2019-04-07 DIAGNOSIS — Z9181 History of falling: Secondary | ICD-10-CM | POA: Diagnosis not present

## 2019-04-07 DIAGNOSIS — K5900 Constipation, unspecified: Secondary | ICD-10-CM | POA: Diagnosis present

## 2019-04-07 DIAGNOSIS — I350 Nonrheumatic aortic (valve) stenosis: Secondary | ICD-10-CM | POA: Diagnosis present

## 2019-04-07 DIAGNOSIS — Z9849 Cataract extraction status, unspecified eye: Secondary | ICD-10-CM | POA: Diagnosis not present

## 2019-04-07 DIAGNOSIS — Z882 Allergy status to sulfonamides status: Secondary | ICD-10-CM | POA: Diagnosis not present

## 2019-04-07 DIAGNOSIS — N3000 Acute cystitis without hematuria: Secondary | ICD-10-CM | POA: Diagnosis present

## 2019-04-07 DIAGNOSIS — Z885 Allergy status to narcotic agent status: Secondary | ICD-10-CM | POA: Diagnosis not present

## 2019-04-07 DIAGNOSIS — I110 Hypertensive heart disease with heart failure: Secondary | ICD-10-CM | POA: Diagnosis present

## 2019-04-07 DIAGNOSIS — Z881 Allergy status to other antibiotic agents status: Secondary | ICD-10-CM | POA: Diagnosis not present

## 2019-04-07 DIAGNOSIS — Z7982 Long term (current) use of aspirin: Secondary | ICD-10-CM | POA: Diagnosis not present

## 2019-04-07 DIAGNOSIS — J9621 Acute and chronic respiratory failure with hypoxia: Secondary | ICD-10-CM | POA: Diagnosis present

## 2019-04-07 LAB — BASIC METABOLIC PANEL
Anion gap: 8 (ref 5–15)
BUN: 18 mg/dL (ref 8–23)
CO2: 26 mmol/L (ref 22–32)
Calcium: 8.8 mg/dL — ABNORMAL LOW (ref 8.9–10.3)
Chloride: 106 mmol/L (ref 98–111)
Creatinine, Ser: 0.8 mg/dL (ref 0.44–1.00)
GFR calc Af Amer: >60 mL/min (ref >60)
GFR calc non Af Amer: >60 mL/min (ref >60)
Glucose, Bld: 138 mg/dL — ABNORMAL HIGH (ref 70–99)
Potassium: 4.4 mmol/L (ref 3.5–5.1)
Sodium: 140 mmol/L (ref 135–145)

## 2019-04-07 LAB — CBC
HCT: 41.7 % (ref 36.0–46.0)
Hemoglobin: 12.6 g/dL (ref 12.0–15.0)
MCH: 26.4 pg (ref 26.0–34.0)
MCHC: 30.2 g/dL (ref 30.0–36.0)
MCV: 87.2 fL (ref 80.0–100.0)
Platelets: 62 10*3/uL — ABNORMAL LOW (ref 150–400)
RBC: 4.78 MIL/uL (ref 3.87–5.11)
RDW: 16.6 % — ABNORMAL HIGH (ref 11.5–15.5)
WBC: 4.8 10*3/uL (ref 4.0–10.5)
nRBC: 0 % (ref 0.0–0.2)

## 2019-04-07 LAB — TSH: TSH: 0.496 u[IU]/mL (ref 0.350–4.500)

## 2019-04-07 MED ORDER — SPIRONOLACTONE 25 MG PO TABS
12.5000 mg | ORAL_TABLET | Freq: Every day | ORAL | Status: DC
  Administered 2019-04-07 – 2019-04-10 (×4): 12.5 mg via ORAL
  Filled 2019-04-07: qty 1
  Filled 2019-04-07: qty 0.5
  Filled 2019-04-07: qty 1
  Filled 2019-04-07: qty 0.5
  Filled 2019-04-07 (×2): qty 1
  Filled 2019-04-07 (×2): qty 0.5

## 2019-04-07 MED ORDER — LORATADINE 10 MG PO TABS
10.0000 mg | ORAL_TABLET | Freq: Every day | ORAL | Status: DC
  Administered 2019-04-08 – 2019-04-09 (×2): 10 mg via ORAL
  Filled 2019-04-07 (×4): qty 1

## 2019-04-07 MED ORDER — METOPROLOL TARTRATE 25 MG PO TABS
25.0000 mg | ORAL_TABLET | Freq: Two times a day (BID) | ORAL | Status: DC
  Administered 2019-04-07 – 2019-04-10 (×7): 25 mg via ORAL
  Filled 2019-04-07 (×8): qty 1

## 2019-04-07 MED ORDER — SODIUM CHLORIDE 0.9 % IV SOLN
250.0000 mL | INTRAVENOUS | Status: DC | PRN
  Administered 2019-04-07: 250 mL via INTRAVENOUS

## 2019-04-07 MED ORDER — ONDANSETRON HCL 4 MG PO TABS: 4.0000 mg | ORAL_TABLET | Freq: Four times a day (QID) | ORAL | Status: DC | PRN

## 2019-04-07 MED ORDER — METFORMIN HCL 500 MG PO TABS
500.0000 mg | ORAL_TABLET | Freq: Two times a day (BID) | ORAL | Status: DC
  Filled 2019-04-07: qty 1

## 2019-04-07 MED ORDER — GABAPENTIN 300 MG PO CAPS
300.0000 mg | ORAL_CAPSULE | Freq: Three times a day (TID) | ORAL | Status: DC
  Administered 2019-04-07 – 2019-04-10 (×10): 300 mg via ORAL
  Filled 2019-04-07 (×10): qty 1

## 2019-04-07 MED ORDER — FERROUS SULFATE 325 (65 FE) MG PO TABS
325.0000 mg | ORAL_TABLET | Freq: Every day | ORAL | Status: DC
  Administered 2019-04-08 – 2019-04-09 (×2): 325 mg via ORAL
  Filled 2019-04-07 (×4): qty 1

## 2019-04-07 MED ORDER — ASPIRIN EC 81 MG PO TBEC
81.0000 mg | DELAYED_RELEASE_TABLET | Freq: Every day | ORAL | Status: DC
  Administered 2019-04-07 – 2019-04-10 (×4): 81 mg via ORAL
  Filled 2019-04-07 (×4): qty 1

## 2019-04-07 MED ORDER — MELOXICAM 7.5 MG PO TABS
7.5000 mg | ORAL_TABLET | Freq: Every day | ORAL | Status: DC | PRN
  Administered 2019-04-07: 21:00:00 7.5 mg via ORAL
  Filled 2019-04-07: qty 1

## 2019-04-07 MED ORDER — ALBUTEROL SULFATE (2.5 MG/3ML) 0.083% IN NEBU: 3.0000 mL | INHALATION_SOLUTION | Freq: Four times a day (QID) | RESPIRATORY_TRACT | Status: DC | PRN

## 2019-04-07 MED ORDER — POLYETHYLENE GLYCOL 3350 17 G PO PACK
17.0000 g | PACK | Freq: Every day | ORAL | Status: DC | PRN
  Administered 2019-04-07: 17 g via ORAL
  Filled 2019-04-07: qty 1

## 2019-04-07 MED ORDER — ONDANSETRON HCL 4 MG/2ML IJ SOLN
4.0000 mg | Freq: Four times a day (QID) | INTRAMUSCULAR | Status: DC | PRN
  Administered 2019-04-07: 4 mg via INTRAVENOUS
  Filled 2019-04-07: qty 2

## 2019-04-07 MED ORDER — FUROSEMIDE 20 MG PO TABS
20.0000 mg | ORAL_TABLET | Freq: Two times a day (BID) | ORAL | Status: DC
  Administered 2019-04-07 – 2019-04-10 (×7): 20 mg via ORAL
  Filled 2019-04-07 (×7): qty 1

## 2019-04-07 MED ORDER — AMIODARONE HCL 200 MG PO TABS
100.0000 mg | ORAL_TABLET | Freq: Every day | ORAL | Status: DC
  Administered 2019-04-07 – 2019-04-10 (×4): 100 mg via ORAL
  Filled 2019-04-07 (×4): qty 1

## 2019-04-07 MED ORDER — ACETAMINOPHEN 325 MG PO TABS: 650.0000 mg | ORAL_TABLET | Freq: Four times a day (QID) | ORAL | Status: DC | PRN

## 2019-04-07 MED ORDER — ENOXAPARIN SODIUM 40 MG/0.4ML ~~LOC~~ SOLN
40.0000 mg | SUBCUTANEOUS | Status: DC
  Filled 2019-04-07: qty 0.4

## 2019-04-07 MED ORDER — LACTULOSE 10 GM/15ML PO SOLN
30.0000 g | Freq: Once | ORAL | Status: AC
  Administered 2019-04-07: 30 g via ORAL
  Filled 2019-04-07: qty 60

## 2019-04-07 MED ORDER — SODIUM CHLORIDE 0.9% FLUSH: 3.0000 mL | INTRAVENOUS | Status: DC | PRN

## 2019-04-07 MED ORDER — SODIUM CHLORIDE 0.9% FLUSH
3.0000 mL | Freq: Two times a day (BID) | INTRAVENOUS | Status: DC
  Administered 2019-04-07 – 2019-04-10 (×6): 3 mL via INTRAVENOUS

## 2019-04-07 MED ORDER — ACETAMINOPHEN 325 MG PO TABS
650.0000 mg | ORAL_TABLET | Freq: Four times a day (QID) | ORAL | Status: DC | PRN
  Administered 2019-04-07 – 2019-04-10 (×2): 650 mg via ORAL
  Filled 2019-04-07 (×2): qty 2

## 2019-04-07 MED ORDER — PANTOPRAZOLE SODIUM 40 MG PO TBEC
40.0000 mg | DELAYED_RELEASE_TABLET | Freq: Two times a day (BID) | ORAL | Status: DC
  Administered 2019-04-07 (×2): 40 mg via ORAL
  Filled 2019-04-07 (×2): qty 1

## 2019-04-07 MED ORDER — SODIUM CHLORIDE 0.9 % IV SOLN
1.0000 g | INTRAVENOUS | Status: DC
  Administered 2019-04-07 – 2019-04-09 (×3): 1 g via INTRAVENOUS
  Filled 2019-04-07: qty 10
  Filled 2019-04-07 (×2): qty 1
  Filled 2019-04-07: qty 10

## 2019-04-07 MED ORDER — ACETAMINOPHEN 650 MG RE SUPP: 650.0000 mg | Freq: Four times a day (QID) | RECTAL | Status: DC | PRN

## 2019-04-07 MED ORDER — SODIUM CHLORIDE 0.9% FLUSH
3.0000 mL | Freq: Two times a day (BID) | INTRAVENOUS | Status: DC
  Administered 2019-04-08: 3 mL via INTRAVENOUS

## 2019-04-07 MED ORDER — PANTOPRAZOLE SODIUM 40 MG PO TBEC
40.0000 mg | DELAYED_RELEASE_TABLET | Freq: Every day | ORAL | Status: DC
  Administered 2019-04-08 – 2019-04-10 (×3): 40 mg via ORAL
  Filled 2019-04-07 (×3): qty 1

## 2019-04-07 NOTE — Progress Notes (Addendum)
Patient ID: Jessica Blackwell, female   DOB: 04-May-1925, 85 y.o.   MRN: [id number]  [employer] PROGRESS NOTE  Jessica Blackwell [id number] DOB: Jul 15, 1925 DOA: 04/06/2019 PCP: Idelle Crouch, MD  HPI/Subjective: Patient coming in with weakness.  Has a history of aortic stenosis and congestive heart failure.  Patient having abdominal pain and some diarrhea.  Objective: Vitals:   04/07/19 0925 04/07/19 0935  BP:    Pulse:    Resp:    Temp:    SpO2: (!) 85% 91%    Intake/Output Summary (Last 24 hours) at 04/07/2019 1405 Last data filed at 04/07/2019 1239 Gross per 24 hour  Intake -  Output 1350 ml  Net -1350 ml   Filed Weights   04/06/19 1500 04/07/19 0024  Weight: 66.2 kg 67.4 kg    ROS: Review of Systems  Constitutional: Negative for chills and fever.  Eyes: Negative for blurred vision.  Respiratory: Negative for cough and shortness of breath.   Cardiovascular: Negative for chest pain.  Gastrointestinal: Negative for abdominal pain, constipation, diarrhea, nausea and vomiting.  Genitourinary: Positive for dysuria.  Musculoskeletal: Negative for joint pain.  Neurological: Positive for focal weakness. Negative for dizziness and headaches.   Exam: Physical Exam  Constitutional: She is oriented to person, place, and time.  HENT:  Nose: No mucosal edema.  Mouth/Throat: No oropharyngeal exudate or posterior oropharyngeal edema.  Eyes: Pupils are equal, round, and reactive to light. Conjunctivae, EOM and lids are normal.  Neck: No JVD present. Carotid bruit is not present. No edema present. No thyroid mass and no thyromegaly present.  Cardiovascular: S1 normal and S2 normal. Exam reveals no gallop.  Murmur heard.  Systolic murmur is present with a grade of 4/6. Pulses:      Dorsalis pedis pulses are 2+ on the right side and 2+ on the left side.  Respiratory: No respiratory distress. She has decreased breath sounds in the right lower field and the left lower field. She  has no wheezes. She has no rhonchi. She has no rales.  GI: Soft. Bowel sounds are normal. There is no abdominal tenderness.  Musculoskeletal:     Right ankle: She exhibits no swelling.     Left ankle: She exhibits no swelling.  Lymphadenopathy:    She has no cervical adenopathy.  Neurological: She is alert and oriented to person, place, and time. No cranial nerve deficit.  Skin: Skin is warm. No rash noted. Nails show no clubbing.  Psychiatric: She has a normal mood and affect.      Data Reviewed: Basic Metabolic Panel: Recent Labs  Lab 04/06/19 1500 04/07/19 0528  NA 137 140  K 4.1 4.4  CL 101 106  CO2 26 26  GLUCOSE 121* 138*  BUN 20 18  CREATININE 1.02* 0.80  CALCIUM 9.0 8.8*   Liver Function Tests: Recent Labs  Lab 04/06/19 1500  AST 14*  ALT 10  ALKPHOS 38  BILITOT 1.1  PROT 6.6  ALBUMIN 4.0   CBC: Recent Labs  Lab 04/06/19 1500 04/07/19 0528  WBC 5.7 4.8  HGB 12.8 12.6  HCT 41.4 41.7  MCV 86.8 87.2  PLT 67* 62*   BNP (last 3 results) Recent Labs    05/20/18 1130 04/06/19 1500  BNP 243.0* 337.0*      Studies: Dg Chest 2 View  Result Date: 04/06/2019 CLINICAL DATA:  Hypoxia. Weakness. The patient fell while walking to her car. EXAM: CHEST - 2 VIEW COMPARISON:  Chest x-rays dated  03/26/2019 and 05/21/2018 and 07/28/2015 and CT scan dated 01/15/2013 FINDINGS: There is stable cardiomegaly. There is chronic massive enlargement of the main pulmonary artery and of the right main pulmonary artery. There is chronic scarring at the lung bases. 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 No effusions. No appreciable acute bone abnormality. IMPRESSION: 1. No acute cardiopulmonary disease. 2. Chronic massive enlargement of the main pulmonary artery and of the right main pulmonary artery. 3. 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 4. No acute rib  fracture or bone destruction. Electronically Signed   By: Lorriane Shire M.D.   On: 04/06/2019 16:10   Dg Abd 2 Views  Result Date: 04/07/2019 CLINICAL DATA:  Increasing weakness over the past 3-4 days. Severe right lower quadrant pain. EXAM: ABDOMEN - 2 VIEW COMPARISON:  Two views of the abdomen 05/21/2018. CT abdomen and pelvis 05/20/2018. FINDINGS: The bowel gas pattern is normal. There is no evidence of free air. Moderate volume of stool throughout the colon is noted. No radio-opaque calculi or other significant radiographic abnormality is seen. Left basilar atelectasis or scar is seen. IMPRESSION: No acute finding. Moderate colonic stool burden. Electronically Signed   By: Inge Rise M.D.   On: 04/07/2019 09:15    Scheduled Meds: . amiodarone  100 mg Oral Daily  . aspirin EC  81 mg Oral Daily  . ferrous sulfate  325 mg Oral Daily  . furosemide  20 mg Oral BID  . gabapentin  300 mg Oral TID  . lactulose  30 g Oral Once  . loratadine  10 mg Oral Daily  . metFORMIN  500 mg Oral BID WC  . metoprolol tartrate  25 mg Oral BID  . [START ON 04/08/2019] pantoprazole  40 mg Oral Daily  . sodium chloride flush  3 mL Intravenous Q12H  . sodium chloride flush  3 mL Intravenous Q12H  . spironolactone  12.5 mg Oral Daily   Continuous Infusions: . sodium chloride    . cefTRIAXone (ROCEPHIN)  IV      Assessment/Plan:  1. Abdominal pain and diarrhea.  Patient has alternating diarrhea and constipation.  Daughter states that when she eats fatty foods she will have diarrhea.  X-ray showing stool burden.  We will give a dose of lactulose to get rid of excess stool.  Discontinue metformin.  Trial of Bentyl. 2. Acute cystitis on IV Rocephin.  Urine culture pending 3. Weakness.  Physical therapy evaluation 4. Chronic systolic congestive heart failure with aortic stenosis.  Continue Lasix and beta-blocker and Spironolactone.  No ACE inhibitor or ARB secondary to severe aortic stenosis 5. Paroxysmal  atrial fibrillation on amiodarone 6. Neuropathy on gabapentin 7. Hypertension continue usual medications 8. Thrombocytopenia.  Chronic in nature  Code Status:     Code Status Orders  (From admission, onward)         Start     Ordered   04/07/19 0017  Do not attempt resuscitation (DNR)  Continuous    Question Answer Comment  In the event of cardiac or respiratory ARREST Do not call a "code blue"   In the event of cardiac or respiratory ARREST Do not perform Intubation, CPR, defibrillation or ACLS   In the event of cardiac or respiratory ARREST Use medication by any route, position, wound care, and other measures to relive pain and suffering. May use oxygen, suction and manual  treatment of airway obstruction as needed for comfort.   Comments nurse may pronounce      04/07/19 0017        Code Status History    Date Active Date Inactive Code Status Order ID Comments User Context   04/07/2019 0017 04/07/2019 0017 DNR [id number]  Mayer Camel, NP Inpatient   03/26/2019 1948 03/28/2019 1814 DNR [id number]  Loletha Grayer, MD ED   05/21/2018 2003 05/26/2018 2013 Full Code [id number]  Fritzi Mandes, MD Inpatient   05/20/2018 1418 05/21/2018 2003 DNR [id number]  Loletha Grayer, MD ED   12/22/2016 2052 12/23/2016 1512 Full Code [id number]  Quintella Baton, MD Inpatient   07/28/2015 0638 08/02/2015 1844 Full Code [id number]  Norval Morton, MD Inpatient   Advance Care Planning Activity    Advance Directive Documentation     Most Recent Value  Type of Advance Directive  Healthcare Power of Attorney  Pre-existing out of facility DNR order (yellow form or pink MOST form)  -  "MOST" Form in Place?  -     Family Communication: Spoke with daughter on the phone Disposition Plan: To be determined   Antibiotics: Rocephin  Time spent: 28 minutes  [place]

## 2019-04-07 NOTE — Care Management (Addendum)
RNCM consult received for home O2. MD updated that CHF alone will not cover chronic O2 and there must be documentation of chronic lung disease, failed treatments (IS; FV; INH), along with qualifying O2 sats. RNCM team will follow. Patient is currently under Observation status. Update at 1141A: patient is open to [facility] home health.

## 2019-04-07 NOTE — TOC Initial Note (Addendum)
Transition of Care [facility]) - Initial/Assessment Note    Patient Details  Name: Jessica Blackwell MRN: [id number] Date of Birth: December 25, 1924  Transition of Care [facility]) CM/SW Contact:    Marshell Garfinkel, RN Phone Number: 04/07/2019, 2:45 PM  Clinical Narrative:                 Patient is on isolation for contact and covid as she has declined another Covid test which will be required prior to any SNF. RNCM spoke with daughter [name] and with patient by phone regarding transition of care plan Everyone wants patient to return to home. Patient has a transport wheelchair and walker available for use. She has a hired caregiver M-F 9A-[id number]. I explained that she would need 24/7 care.  She has used [facility] in the past. I have provided Home Care Provider contact along with Oretha Milch for personal care pricing. [name] is aware of PCP follow up appointment and would rather speak with Dr. Doy Hutching about end of life/hospice. I have explained that patient does not meet criteria for home O2. She states MD shared that "she is full of hard stool". [name] is going to make some calls to see if 24/7 care can be arranged. I have requested incentive spirometer order/use to see if we can wean supplemental O2.    Expected Discharge Plan: [place] Barriers to Discharge: Family Issues   Patient Goals and CMS Choice Patient states their goals for this hospitalization and ongoing recovery are:: "I'd like to return to home without Oxygen" CMS Medicare.gov Compare Post Acute Care list provided to:: Patient Choice offered to / list presented to : Patient  Expected Discharge Plan and Services Expected Discharge Plan: [place]   Discharge Planning Services: CM Consult Post Acute Care Choice: [place] Living arrangements for the past 2 months: Single Family Home                           HH Arranged: PT, RN, Nurse's Aide [place] Agency: [facility] Date [facility] Agency  Contacted: 04/07/19 Time [place]: [id number] Representative spoke with at [place]: Jana Half  Prior Living Arrangements/Services Living arrangements for the past 2 months: [place] with:: Self   Do you feel safe going back to the place where you live?: Yes          Current home services: DME, Home PT, Home RN(transport wheelchair, walker)    Activities of Daily Living Home Assistive Devices/Equipment: Environmental consultant (specify type), Wheelchair, Grab bars in shower ADL Screening (condition at time of admission) Patient's cognitive ability adequate to safely complete daily activities?: Yes Is the patient deaf or have difficulty hearing?: No Does the patient have difficulty seeing, even when wearing glasses/contacts?: No Does the patient have difficulty concentrating, remembering, or making decisions?: No Patient able to express need for assistance with ADLs?: Yes Does the patient have difficulty dressing or bathing?: Yes Independently performs ADLs?: No Communication: Independent Dressing (OT): Needs assistance Is this a change from baseline?: Pre-admission baseline Grooming: Needs assistance Is this a change from baseline?: Pre-admission baseline Feeding: Needs assistance Is this a change from baseline?: Pre-admission baseline Bathing: Needs assistance Is this a change from baseline?: Pre-admission baseline Toileting: Independent In/Out Bed: Independent with device (comment) Walks in Home: Independent with device (comment) Does the patient have difficulty walking or climbing stairs?: No Weakness of Legs: None Weakness of Arms/Hands: None  Permission Sought/Granted Permission sought to share information with : Family Supports                Emotional Assessment Appearance:: Appears stated age     Orientation: : Oriented to Self, Oriented to Place, Oriented to  Time, Oriented to Situation      Admission diagnosis:  Acute on chronic  respiratory failure with hypoxia (HCC) [J96.21] Urinary tract infection without hematuria, site unspecified [N39.0] Patient Active Problem List   Diagnosis Date Noted  . UTI (urinary tract infection) 04/06/2019  . CHF (congestive heart failure) ([place]) 03/26/2019  . Non-intractable vomiting with nausea   . LOC (loss of consciousness) ([place]) 12/22/2016  . AKI (acute kidney injury) ([place]) 08/01/2015  . V-tach ([place]) 08/01/2015  . NSVT (nonsustained ventricular tachycardia) ([place])   . Persistent atrial fibrillation   . Acute systolic congestive heart failure ([place]) 07/31/2015  . Acute respiratory failure with hypoxia ([place]) 07/28/2015  . Fall at home 07/28/2015  . GERD (gastroesophageal reflux disease) 07/28/2015  . HTN (hypertension) 07/28/2015  . Anemia 07/28/2015   PCP:  Idelle Crouch, MD Pharmacy:   [place], [address] [place] [address] Phone: [phone] Fax: [phone]     Social Determinants of Health (SDOH) Interventions    Readmission Risk Interventions No flowsheet data found.

## 2019-04-07 NOTE — Evaluation (Deleted)
Physical Therapy Evaluation Patient Details Name: Jessica Blackwell MRN: [id number] DOB: 1924-11-06 Today's Date: 04/07/2019   History of Present Illness  Pt is a 85 year old female admitted for UTI. Pt reports complaints of weakness in legs causing fall during ambulation to car. Pt with recent admission for exacerbation of CHF 1 week ago. History includes aortic stenosis, CHF, GERD, and HTN.   Clinical Impression  Pt is a pleasant 85 year old female who was admitted for UTI. Pt performs bed mobility with mod assist and unable/refuses further mobility efforts. Pt demonstrates deficits with strength/mobility/endurance. Pt becomes SOB with any exertion despite 2L of O2 donned. Also complaints of nausea with mobility efforts. Doesn't appear at baseline level and is at risk for re-admission. Would benefit from skilled PT to address above deficits and promote optimal return to PLOF; recommend transition to STR upon discharge from acute hospitalization.     Follow Up Recommendations SNF    Equipment Recommendations  None recommended by PT    Recommendations for Other Services       Precautions / Restrictions Precautions Precautions: Fall Precaution Comments: pt refusing covid testing, treat as if +, therfore N95 and negative pressure room activated. Restrictions Weight Bearing Restrictions: No      Mobility  Bed Mobility Overal bed mobility: Needs Assistance Bed Mobility: Supine to Sit;Sit to Supine     Supine to sit: Mod assist Sit to supine: Mod assist   General bed mobility comments: Needs assist for for trunk support. Once seated at EOB, pt becomes nauseated and only able to maintain seated balance for a few minutes. Pt then requests to return back supine. Needs mod/max assist to scoot up towards [place].  Transfers                 General transfer comment: unable as pt feels extremely weak and nauseated  Ambulation/Gait             General Gait Details: not  performed  Stairs            Wheelchair Mobility    Modified Rankin (Stroke Patients Only)       Balance Overall balance assessment: Needs assistance Sitting-balance support: Feet supported;Bilateral upper extremity supported Sitting balance-Leahy Scale: Fair                                       Pertinent Vitals/Pain Pain Assessment: No/denies pain    Home Living Family/patient expects to be discharged to:: Private residence Living Arrangements: Alone Available Help at Discharge: Family;Available PRN/intermittently(caregiver 10-5 to help with house work) Type of Home: House Home Access: Stairs to enter Entrance Stairs-Rails: Left Entrance Stairs-Number of Steps: 3 Home Layout: One level Home Equipment: [place] - 2 wheels;Walker - 4 wheels;Wheelchair - manual;Shower seat;Hand held shower head;Adaptive equipment      Prior Function Level of Independence: Needs assistance   Gait / Transfers Assistance Needed: pt reports she uses rollater in house and ambulates very limited distances outside of the home           Hand Dominance        Extremity/Trunk Assessment   Upper Extremity Assessment Upper Extremity Assessment: Generalized weakness    Lower Extremity Assessment Lower Extremity Assessment: Generalized weakness(B LE grossly 3/5)       Communication   Communication: HOH  Cognition Arousal/Alertness: Awake/alert Behavior During Therapy: WFL for tasks assessed/performed Overall Cognitive  Status: Within Functional Limits for tasks assessed                                        General Comments      Exercises Other Exercises Other Exercises: supine ther-ex performed on B LE including knee flexion, SLRs, AP, ankle circles, and hip abd/add. All ther-ex performed x 10 reps with cga. Reviewed frequency and duration for continued performance.   Assessment/Plan    PT Assessment Patient needs continued PT services   PT Problem List Decreased strength;Decreased range of motion;Decreased activity tolerance;Decreased balance;Decreased mobility;Decreased coordination       PT Treatment Interventions DME instruction;Gait training;Functional mobility training;Therapeutic activities;Therapeutic exercise;Stair training;Balance training;Patient/family education    PT Goals (Current goals can be found in the Care Plan section)  Acute Rehab PT Goals Patient Stated Goal: become stronger PT Goal Formulation: With patient Time For Goal Achievement: 04/21/19 Potential to Achieve Goals: Good Additional Goals Additional Goal #1: Pt will be able to perform bed mobility/transfers with cga and RW in order to improve functional independence    Frequency Min 2X/week   Barriers to discharge Inaccessible home environment;Decreased caregiver support      Co-evaluation               AM-PAC PT "6 Clicks" Mobility  Outcome Measure Help needed turning from your back to your side while in a flat bed without using bedrails?: A Lot Help needed moving from lying on your back to sitting on the side of a flat bed without using bedrails?: A Lot Help needed moving to and from a bed to a chair (including a wheelchair)?: A Lot Help needed standing up from a chair using your arms (e.g., wheelchair or bedside chair)?: A Lot Help needed to walk in hospital room?: A Lot Help needed climbing 3-5 steps with a railing? : Total 6 Click Score: 11    End of Session Equipment Utilized During Treatment: Oxygen Activity Tolerance: Patient limited by fatigue Patient left: in bed;with bed alarm set Nurse Communication: Mobility status PT Visit Diagnosis: Unsteadiness on feet (R26.81);Other abnormalities of gait and mobility (R26.89);Muscle weakness (generalized) (M62.81);Difficulty in walking, not elsewhere classified (R26.2)    Time: 6222-9798 PT Time Calculation (min) (ACUTE ONLY): 40 min   Charges:   PT Evaluation $PT Eval  Moderate Complexity: 1 Mod PT Treatments $Therapeutic Exercise: 23-37 mins        Greggory Stallion, PT, DPT [phone]   Jessica Blackwell 04/07/2019, 4:45 PM

## 2019-04-07 NOTE — Evaluation (Addendum)
Physical Therapy Evaluation Patient Details Name: Jessica Blackwell MRN: [id number] DOB: 06/20/1925 Today's Date: 04/07/2019   History of Present Illness  Pt is a 85 year old female admitted for UTI. Pt reports complaints of weakness in legs causing fall during ambulation to car. Pt with recent admission for exacerbation of CHF 1 week ago. History includes aortic stenosis, CHF, GERD, and HTN.   Clinical Impression  Pt is a pleasant 85 year old female who was admitted for UTI. Pt performs bed mobility with mod assist. Once seated at EOB, able to sit with supervision for a few minutes, however becomes nauseated requesting to return supine. Pt unable to perform further mobility efforts due to symptoms. Pt on 2L of O2, no pulse ox in room, however pt SOB with exertion. Doesn't have home O2 at baseline. Pt demonstrates deficits with strength/endurance/mobility. B LE weakness > B UE. Pt is currently not at baseline level. Would benefit from skilled PT to address above deficits and promote optimal return to PLOF; recommend transition to STR upon discharge from acute hospitalization.     Follow Up Recommendations SNF    Equipment Recommendations  None recommended by PT    Recommendations for Other Services       Precautions / Restrictions Precautions Precautions: Fall Precaution Comments: pt refusing covid testing, treat as if +, therfore N95 and negative pressure room activated. Restrictions Weight Bearing Restrictions: No      Mobility  Bed Mobility Overal bed mobility: Needs Assistance Bed Mobility: Supine to Sit;Sit to Supine     Supine to sit: Mod assist Sit to supine: Mod assist   General bed mobility comments: Needs assist for for trunk support. Once seated at EOB, pt becomes nauseated and only able to maintain seated balance for a few minutes. Pt then requests to return back supine. Needs mod/max assist to scoot up towards [place].  Transfers                 General transfer  comment: unable as pt feels extremely weak and nauseated  Ambulation/Gait             General Gait Details: not performed  Stairs            Wheelchair Mobility    Modified Rankin (Stroke Patients Only)       Balance Overall balance assessment: Needs assistance Sitting-balance support: Feet supported;Bilateral upper extremity supported Sitting balance-Leahy Scale: Fair                                       Pertinent Vitals/Pain Pain Assessment: No/denies pain    Home Living Family/patient expects to be discharged to:: Private residence Living Arrangements: Alone Available Help at Discharge: Family;Available PRN/intermittently(caregiver 10-5 to help with house work) Type of Home: House Home Access: Stairs to enter Entrance Stairs-Rails: Left Entrance Stairs-Number of Steps: 3 Home Layout: One level Home Equipment: [place] - 2 wheels;Walker - 4 wheels;Wheelchair - manual;Shower seat;Hand held shower head;Adaptive equipment      Prior Function Level of Independence: Needs assistance   Gait / Transfers Assistance Needed: pt reports she uses rollater in house and ambulates very limited distances outside of the home           Hand Dominance        Extremity/Trunk Assessment   Upper Extremity Assessment Upper Extremity Assessment: Generalized weakness    Lower Extremity Assessment Lower Extremity  Assessment: Generalized weakness(B LE grossly 3/5)       Communication   Communication: HOH  Cognition Arousal/Alertness: Awake/alert Behavior During Therapy: WFL for tasks assessed/performed Overall Cognitive Status: Within Functional Limits for tasks assessed                                        General Comments      Exercises Other Exercises Other Exercises: supine ther-ex performed on B LE including knee flexion, SLRs, AP, ankle circles, and hip abd/add. All ther-ex performed x 10 reps with cga. Reviewed  frequency and duration for continued performance.   Assessment/Plan    PT Assessment Patient needs continued PT services  PT Problem List Decreased strength;Decreased range of motion;Decreased activity tolerance;Decreased balance;Decreased mobility;Decreased coordination       PT Treatment Interventions DME instruction;Gait training;Functional mobility training;Therapeutic activities;Therapeutic exercise;Stair training;Balance training;Patient/family education    PT Goals (Current goals can be found in the Care Plan section)  Acute Rehab PT Goals Patient Stated Goal: become stronger PT Goal Formulation: With patient Time For Goal Achievement: 04/21/19 Potential to Achieve Goals: Good    Frequency Min 2X/week   Barriers to discharge Inaccessible home environment;Decreased caregiver support      Co-evaluation               AM-PAC PT "6 Clicks" Mobility  Outcome Measure Help needed turning from your back to your side while in a flat bed without using bedrails?: A Lot Help needed moving from lying on your back to sitting on the side of a flat bed without using bedrails?: A Lot Help needed moving to and from a bed to a chair (including a wheelchair)?: A Lot Help needed standing up from a chair using your arms (e.g., wheelchair or bedside chair)?: A Lot Help needed to walk in hospital room?: A Lot Help needed climbing 3-5 steps with a railing? : Total 6 Click Score: 11    End of Session Equipment Utilized During Treatment: Oxygen Activity Tolerance: Patient limited by fatigue Patient left: in bed;with bed alarm set Nurse Communication: Mobility status PT Visit Diagnosis: Unsteadiness on feet (R26.81);Other abnormalities of gait and mobility (R26.89);Muscle weakness (generalized) (M62.81);Difficulty in walking, not elsewhere classified (R26.2)    Time: 0175-1025 PT Time Calculation (min) (ACUTE ONLY): 40 min   Charges:   PT Evaluation $PT Eval Moderate Complexity: 1  Mod PT Treatments $Therapeutic Exercise: 23-37 mins        Greggory Stallion, PT, DPT [phone]   Alto Gandolfo 04/07/2019, 1:04 PM

## 2019-04-07 NOTE — Plan of Care (Signed)
  Problem: Clinical Measurements: Goal: Respiratory complications will improve Outcome: Not Progressing  Pt now on O2

## 2019-04-07 NOTE — Care Management Obs Status (Addendum)
[place] NOTIFICATION   Patient Details  Name: Jessica Blackwell MRN: [id number] Date of Birth: 16-Apr-1925   Medicare Observation Status Notification Given:  Yes Patient is on isolation; explained by phone [phone]. Copy left with 2A team to give to patient. Update at 1430: [place] explained to daughter [name] by phone [phone].   Marshell Garfinkel, RN 04/07/2019, 2:03 PM

## 2019-04-07 NOTE — Plan of Care (Signed)
  Problem: Urinary Elimination: Goal: Signs and symptoms of infection will decrease Outcome: Progressing   

## 2019-04-07 NOTE — Progress Notes (Signed)
Pt arrived to floor. Pt A&O with no complaints of pain at this time. Telemetry monitor applied and called to CCMD. Oriented to room and call light, bell and bed. In the ER, pt refused the COVID test. Pt with negative COVID test on July 10th.

## 2019-04-08 LAB — SARS CORONAVIRUS 2 BY RT PCR (HOSPITAL ORDER, PERFORMED IN ~~LOC~~ HOSPITAL LAB): SARS Coronavirus 2: NEGATIVE

## 2019-04-08 NOTE — Progress Notes (Addendum)
Patient ID: Jessica Blackwell, female   DOB: Sep 16, 1925, 85 y.o.   MRN: [id number]  [facility] PROGRESS NOTE  Jessica Blackwell:[id number] DOB: 1924/09/21 DOA: 04/06/2019 PCP: Idelle Crouch, MD  HPI/Subjective: Patient feels weak, had diarrhea after lactulose  Objective: Vitals:   04/08/19 0629 04/08/19 0830  BP: (!) 136/58 (!) 118/59  Pulse: 64 64  Resp: 16 18  Temp: 98.5 F (36.9 C) 98.4 F (36.9 C)  SpO2: 94% 94%    Intake/Output Summary (Last 24 hours) at 04/08/2019 1345 Last data filed at 04/07/2019 1825 Gross per 24 hour  Intake -  Output 700 ml  Net -700 ml   Filed Weights   04/06/19 1500 04/07/19 0024  Weight: 66.2 kg 67.4 kg    ROS: Review of Systems  Constitutional: Negative for chills and fever.  Eyes: Negative for blurred vision.  Respiratory: Negative for cough and shortness of breath.   Cardiovascular: Negative for chest pain.  Gastrointestinal: Negative for abdominal pain, constipation, diarrhea, nausea and vomiting.  Genitourinary: Negative for dysuria.  Musculoskeletal: Negative for joint pain.  Neurological: Positive for focal weakness. Negative for dizziness and headaches.   Exam: Physical Exam  Constitutional: She is oriented to person, place, and time.  HENT:  Nose: No mucosal edema.  Mouth/Throat: No oropharyngeal exudate or posterior oropharyngeal edema.  Eyes: Pupils are equal, round, and reactive to light. Conjunctivae, EOM and lids are normal.  Neck: No JVD present. Carotid bruit is not present. No edema present. No thyroid mass and no thyromegaly present.  Cardiovascular: S1 normal and S2 normal. Exam reveals no gallop.  Murmur heard.  Systolic murmur is present with a grade of 4/6. Pulses:      Dorsalis pedis pulses are 2+ on the right side and 2+ on the left side.  Respiratory: No respiratory distress. She has decreased breath sounds in the right lower field and the left lower field. She has no wheezes. She has no rhonchi. She  has no rales.  GI: Soft. Bowel sounds are normal. There is no abdominal tenderness.  Musculoskeletal:     Right ankle: She exhibits no swelling.     Left ankle: She exhibits no swelling.  Lymphadenopathy:    She has no cervical adenopathy.  Neurological: She is alert and oriented to person, place, and time. No cranial nerve deficit.  Skin: Skin is warm. No rash noted. Nails show no clubbing.  Psychiatric: She has a normal mood and affect.      Data Reviewed: Basic Metabolic Panel: Recent Labs  Lab 04/06/19 1500 04/07/19 0528  NA 137 140  K 4.1 4.4  CL 101 106  CO2 26 26  GLUCOSE 121* 138*  BUN 20 18  CREATININE 1.02* 0.80  CALCIUM 9.0 8.8*   Liver Function Tests: Recent Labs  Lab 04/06/19 1500  AST 14*  ALT 10  ALKPHOS 38  BILITOT 1.1  PROT 6.6  ALBUMIN 4.0   CBC: Recent Labs  Lab 04/06/19 1500 04/07/19 0528  WBC 5.7 4.8  HGB 12.8 12.6  HCT 41.4 41.7  MCV 86.8 87.2  PLT 67* 62*   BNP (last 3 results) Recent Labs    05/20/18 1130 04/06/19 1500  BNP 243.0* 337.0*      Studies: Dg Chest 2 View  Result Date: 04/06/2019 CLINICAL DATA:  Hypoxia. Weakness. The patient fell while walking to her car. EXAM: CHEST - 2 VIEW COMPARISON:  Chest x-rays dated 03/26/2019 and 05/21/2018 and 07/28/2015 and CT scan dated 01/15/2013  FINDINGS: There is stable cardiomegaly. There is chronic massive enlargement of the main pulmonary artery and of the right main pulmonary artery. There is chronic scarring at the lung bases. 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 No effusions. No appreciable acute bone abnormality. IMPRESSION: 1. No acute cardiopulmonary disease. 2. Chronic massive enlargement of the main pulmonary artery and of the right main pulmonary artery. 3. 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 4. No acute rib fracture or bone destruction.  Electronically Signed   By: Lorriane Shire M.D.   On: 04/06/2019 16:10   Dg Abd 2 Views  Result Date: 04/07/2019 CLINICAL DATA:  Increasing weakness over the past 3-4 days. Severe right lower quadrant pain. EXAM: ABDOMEN - 2 VIEW COMPARISON:  Two views of the abdomen 05/21/2018. CT abdomen and pelvis 05/20/2018. FINDINGS: The bowel gas pattern is normal. There is no evidence of free air. Moderate volume of stool throughout the colon is noted. No radio-opaque calculi or other significant radiographic abnormality is seen. Left basilar atelectasis or scar is seen. IMPRESSION: No acute finding. Moderate colonic stool burden. Electronically Signed   By: Inge Rise M.D.   On: 04/07/2019 09:15    Scheduled Meds: . amiodarone  100 mg Oral Daily  . aspirin EC  81 mg Oral Daily  . ferrous sulfate  325 mg Oral Daily  . furosemide  20 mg Oral BID  . gabapentin  300 mg Oral TID  . loratadine  10 mg Oral Daily  . metoprolol tartrate  25 mg Oral BID  . pantoprazole  40 mg Oral Daily  . sodium chloride flush  3 mL Intravenous Q12H  . sodium chloride flush  3 mL Intravenous Q12H  . spironolactone  12.5 mg Oral Daily   Continuous Infusions: . sodium chloride 250 mL (04/07/19 1638)  . cefTRIAXone (ROCEPHIN)  IV 1 g (04/07/19 1640)    Assessment/Plan:  1. Abdominal pain and diarrhea.  Patient has alternating diarrhea and constipation.  Patient got rid of stool with lactulose yesterday  2. Acute cystitis on IV Rocephin.  Urine culture showing Klebsiella.  Urine culture still pending. 3. Weakness.  Physical therapy evaluation.  Patient does not want to go to rehab and would like to go home. 4. Chronic systolic congestive heart failure with aortic stenosis.  Continue Lasix and beta-blocker and Spironolactone.  No ACE inhibitor or ARB secondary to severe aortic stenosis 5. Paroxysmal atrial fibrillation on amiodarone 6. Neuropathy on gabapentin 7. Hypertension continue usual  medications 8. Thrombocytopenia.  Chronic in nature 9. Abnormal CT scan of the chest back in 2010 showed a soft tissue pleural-based nodule which is also seen on recent chest x-ray. 10. Acute hypoxic respiratory failure.  Check pulse ox on room air and qualify for oxygen if still hypoxic. 11. Patient refused COVID 19 testing and is in isolation.  With refusing this testing she would not be a candidate to go to rehab anyway.  Code Status:     Code Status Orders  (From admission, onward)         Start     Ordered   04/07/19 0017  Do not attempt resuscitation (DNR)  Continuous    Question Answer Comment  In the event of cardiac or respiratory ARREST Do not call a "code blue"   In the event of cardiac or respiratory ARREST Do not perform Intubation, CPR,  defibrillation or ACLS   In the event of cardiac or respiratory ARREST Use medication by any route, position, wound care, and other measures to relive pain and suffering. May use oxygen, suction and manual treatment of airway obstruction as needed for comfort.   Comments nurse may pronounce      04/07/19 0017        Code Status History    Date Active Date Inactive Code Status Order ID Comments User Context   04/07/2019 0017 04/07/2019 0017 DNR [id number]  Mayer Camel, NP Inpatient   03/26/2019 1948 03/28/2019 1814 DNR [id number]  Loletha Grayer, MD ED   05/21/2018 2003 05/26/2018 2013 Full Code [id number]  Fritzi Mandes, MD Inpatient   05/20/2018 1418 05/21/2018 2003 DNR [id number]  Loletha Grayer, MD ED   12/22/2016 2052 12/23/2016 1512 Full Code [id number]  Quintella Baton, MD Inpatient   07/28/2015 0638 08/02/2015 1844 Full Code [id number]  Norval Morton, MD Inpatient   Advance Care Planning Activity    Advance Directive Documentation     Most Recent Value  Type of Advance Directive  Healthcare Power of Attorney  Pre-existing out of facility DNR order (yellow form or pink MOST form)  -  "MOST" Form in Place?  -     Family Communication:  Spoke with daughter on the phone Disposition Plan: Will discharge home tomorrow morning.  Patient may end up needing home oxygen.   Antibiotics: Rocephin  Time spent: 27 minutes  [place]

## 2019-04-08 NOTE — Progress Notes (Signed)
Notified MD of blood pressure. Medication held at this time. Will continue to monitor and assess.

## 2019-04-09 LAB — CBC
HCT: 44.2 % (ref 36.0–46.0)
Hemoglobin: 13.3 g/dL (ref 12.0–15.0)
MCH: 26.7 pg (ref 26.0–34.0)
MCHC: 30.1 g/dL (ref 30.0–36.0)
MCV: 88.6 fL (ref 80.0–100.0)
Platelets: 74 10*3/uL — ABNORMAL LOW (ref 150–400)
RBC: 4.99 MIL/uL (ref 3.87–5.11)
RDW: 16.3 % — ABNORMAL HIGH (ref 11.5–15.5)
WBC: 5.6 10*3/uL (ref 4.0–10.5)
nRBC: 0 % (ref 0.0–0.2)

## 2019-04-09 MED ORDER — PANTOPRAZOLE SODIUM 40 MG PO TBEC: 40.0000 mg | DELAYED_RELEASE_TABLET | Freq: Every day | ORAL | Status: AC

## 2019-04-09 MED ORDER — POLYETHYLENE GLYCOL 3350 17 G PO PACK: 17.0000 g | PACK | Freq: Every day | ORAL | 0 refills | Status: AC | PRN

## 2019-04-09 MED ORDER — AMIODARONE HCL 200 MG PO TABS: 100.0000 mg | ORAL_TABLET | Freq: Every day | ORAL | Status: AC

## 2019-04-09 MED ORDER — CEPHALEXIN 250 MG PO CAPS: 250.0000 mg | ORAL_CAPSULE | Freq: Three times a day (TID) | ORAL | 0 refills | Status: AC

## 2019-04-09 MED ORDER — MELOXICAM 7.5 MG PO TABS: 7.5000 mg | ORAL_TABLET | Freq: Every day | ORAL | Status: DC | PRN

## 2019-04-09 NOTE — Progress Notes (Addendum)
Patient ID: Jessica Blackwell, female   DOB: 02-Mar-1925, 85 y.o.   MRN: [id number]  [facility] PROGRESS NOTE  SHERE EISENHART YHC:[id number] DOB: 08-14-1925 DOA: 04/06/2019 PCP: Idelle Crouch, MD  HPI/Subjective: Patient feels weak and could hardly stand up.  Patient does not want to go to rehab.  Patient's breathing is okay.  Patient could not come off the oxygen.  Objective: Vitals:   04/09/19 0555 04/09/19 0714  BP: 106/61 105/60  Pulse: 87 73  Resp: 18 19  Temp: 97.8 F (36.6 C) 97.7 F (36.5 C)  SpO2: 90% 94%    Intake/Output Summary (Last 24 hours) at 04/09/2019 1356 Last data filed at 04/09/2019 0958 Gross per 24 hour  Intake 456.02 ml  Output -  Net 456.02 ml   Filed Weights   04/06/19 1500 04/07/19 0024 04/09/19 0300  Weight: 66.2 kg 67.4 kg 67.9 kg    ROS: Review of Systems  Constitutional: Negative for chills and fever.  Eyes: Negative for blurred vision.  Respiratory: Negative for cough and shortness of breath.   Cardiovascular: Negative for chest pain.  Gastrointestinal: Negative for abdominal pain, constipation, diarrhea, nausea and vomiting.  Genitourinary: Negative for dysuria.  Musculoskeletal: Negative for joint pain.  Neurological: Positive for focal weakness. Negative for dizziness and headaches.   Exam: Physical Exam  Constitutional: She is oriented to person, place, and time.  HENT:  Nose: No mucosal edema.  Mouth/Throat: No oropharyngeal exudate or posterior oropharyngeal edema.  Eyes: Pupils are equal, round, and reactive to light. Conjunctivae, EOM and lids are normal.  Neck: No JVD present. Carotid bruit is not present. No edema present. No thyroid mass and no thyromegaly present.  Cardiovascular: S1 normal and S2 normal. Exam reveals no gallop.  Murmur heard.  Systolic murmur is present with a grade of 4/6. Pulses:      Dorsalis pedis pulses are 2+ on the right side and 2+ on the left side.  Respiratory: No respiratory distress. She  has decreased breath sounds in the right lower field and the left lower field. She has no wheezes. She has no rhonchi. She has no rales.  GI: Soft. Bowel sounds are normal. There is no abdominal tenderness.  Musculoskeletal:     Right ankle: She exhibits no swelling.     Left ankle: She exhibits no swelling.  Lymphadenopathy:    She has no cervical adenopathy.  Neurological: She is alert and oriented to person, place, and time. No cranial nerve deficit.  Skin: Skin is warm. No rash noted. Nails show no clubbing.  Psychiatric: She has a normal mood and affect.      Data Reviewed: Basic Metabolic Panel: Recent Labs  Lab 04/06/19 1500 04/07/19 0528  NA 137 140  K 4.1 4.4  CL 101 106  CO2 26 26  GLUCOSE 121* 138*  BUN 20 18  CREATININE 1.02* 0.80  CALCIUM 9.0 8.8*   Liver Function Tests: Recent Labs  Lab 04/06/19 1500  AST 14*  ALT 10  ALKPHOS 38  BILITOT 1.1  PROT 6.6  ALBUMIN 4.0   CBC: Recent Labs  Lab 04/06/19 1500 04/07/19 0528 04/09/19 0539  WBC 5.7 4.8 5.6  HGB 12.8 12.6 13.3  HCT 41.4 41.7 44.2  MCV 86.8 87.2 88.6  PLT 67* 62* 74*   BNP (last 3 results) Recent Labs    05/20/18 1130 04/06/19 1500  BNP 243.0* 337.0*      Scheduled Meds: . amiodarone  100 mg Oral Daily  .  aspirin EC  81 mg Oral Daily  . ferrous sulfate  325 mg Oral Daily  . furosemide  20 mg Oral BID  . gabapentin  300 mg Oral TID  . loratadine  10 mg Oral Daily  . metoprolol tartrate  25 mg Oral BID  . pantoprazole  40 mg Oral Daily  . sodium chloride flush  3 mL Intravenous Q12H  . sodium chloride flush  3 mL Intravenous Q12H  . spironolactone  12.5 mg Oral Daily   Continuous Infusions: . sodium chloride Stopped (04/08/19 0153)  . cefTRIAXone (ROCEPHIN)  IV 1 g (04/08/19 1748)    Assessment/Plan:  1. Weakness.  Patient does not want to go to rehab even though physical therapy is recommending that.  Patient can hardly even stand up at this point.  I did speak with  the patient's daughter [name] on the phone.  She knows the patient does not have long to live.  She would like a hospital bed set up.  She does not want the patient to go to rehab and she will take care of the patient when she does get home.   2. Abdominal pain and diarrhea.  Patient has alternating diarrhea and constipation.  Abdominal pain better after getting rid of stool in the colon. 3. Acute cystitis on IV Rocephin.  Urine culture showing Klebsiella. 4. Chronic systolic congestive heart failure with aortic stenosis.  Continue Lasix and beta-blocker and Spironolactone.  No ACE inhibitor or ARB secondary to severe aortic stenosis 5. Paroxysmal atrial fibrillation on amiodarone 6. Neuropathy on gabapentin 7. Hypertension continue usual medications 8. Thrombocytopenia.  Chronic in nature 9. Abnormal CT scan of the chest back in 2010 showed a soft tissue pleural-based nodule which is also seen on recent chest x-ray. 10. Acute on chronic hypoxic respiratory failure.  Patient's pulse ox dropped down into the 80s on room air.  Oxygen supplementation needed at home.  Care manager to set up.  The patient also has chronic systolic congestive heart failure with severe aortic stenosis.  Overall prognosis is poor.  Patient hypoxic on room air. 11. COVID testing negative 12. Patient wants a regular diet stating she is 63 and she wants to eat.  Code Status:     Code Status Orders  (From admission, onward)         Start     Ordered   04/07/19 0017  Do not attempt resuscitation (DNR)  Continuous    Question Answer Comment  In the event of cardiac or respiratory ARREST Do not call a "code blue"   In the event of cardiac or respiratory ARREST Do not perform Intubation, CPR, defibrillation or ACLS   In the event of cardiac or respiratory ARREST Use medication by any route, position, wound care, and other measures to relive pain and suffering. May use oxygen, suction and manual treatment of airway  obstruction as needed for comfort.   Comments nurse may pronounce      04/07/19 0017        Code Status History    Date Active Date Inactive Code Status Order ID Comments User Context   04/07/2019 0017 04/07/2019 0017 DNR [id number]  Mayer Camel, NP Inpatient   03/26/2019 1948 03/28/2019 1814 DNR [id number]  Loletha Grayer, MD ED   05/21/2018 2003 05/26/2018 2013 Full Code [id number]  Fritzi Mandes, MD Inpatient   05/20/2018 1418 05/21/2018 2003 DNR [id number]  Loletha Grayer, MD ED   12/22/2016 2052 12/23/2016 1512 Full Code [id number]  Quintella Baton, MD Inpatient   07/28/2015 6190 08/02/2015 1844 Full Code [id number]  Norval Morton, MD Inpatient   Advance Care Planning Activity    Advance Directive Documentation     Most Recent Value  Type of Advance Directive  Healthcare Power of Attorney  Pre-existing out of facility DNR order (yellow form or pink MOST form)  -  "MOST" Form in Place?  -     Family Communication: Spoke with daughter Lorenda Ishihara on the phone this morning and [name] this afternoon Disposition Plan: [place] discharge today.  Set up hospital bed at home.  Hopefully disposition tomorrow   Antibiotics: Rocephin  Time spent: 35 minutes  [place]

## 2019-04-09 NOTE — NC FL2 (Signed)
[place] LEVEL OF CARE SCREENING TOOL     IDENTIFICATION  Patient Name: Jessica Blackwell Birthdate: 09-05-1925 Sex: female Admission Date (Current Location): 04/06/2019  [place] and [place] Number:  Engineering geologist and Address:  [facility], [address], [place], [place] [address]      Provider Number: [id number]  Attending Physician Name and Address:  Loletha Grayer, MD  Relative Name and Phone Number:  Roxine Caddy Daughter   [phone]    Current Level of Care: SNF Recommended Level of Care: [place] Prior Approval Number:    Date Approved/Denied:   PASRR Number: [id number]  Discharge Plan: SNF    Current Diagnoses: Patient Active Problem List   Diagnosis Date Noted  . UTI (urinary tract infection) 04/06/2019  . CHF (congestive heart failure) ([place]) 03/26/2019  . Non-intractable vomiting with nausea   . LOC (loss of consciousness) ([place]) 12/22/2016  . AKI (acute kidney injury) ([place]) 08/01/2015  . V-tach ([place]) 08/01/2015  . NSVT (nonsustained ventricular tachycardia) ([place])   . Persistent atrial fibrillation   . Acute systolic congestive heart failure ([place]) 07/31/2015  . Acute respiratory failure with hypoxia ([place]) 07/28/2015  . Fall at home 07/28/2015  . GERD (gastroesophageal reflux disease) 07/28/2015  . HTN (hypertension) 07/28/2015  . Anemia 07/28/2015    Orientation RESPIRATION BLADDER Height & Weight     Self, Time, Situation, Place  Normal Indwelling catheter Weight: 67.9 kg Height:  5\' 1"  (154.9 cm)  BEHAVIORAL SYMPTOMS/MOOD NEUROLOGICAL BOWEL NUTRITION STATUS      Continent Diet  AMBULATORY STATUS COMMUNICATION OF NEEDS Skin   Extensive Assist Verbally Bruising                       Personal Care Assistance Level of Assistance  Bathing, Feeding, Dressing, Total care Bathing Assistance: Limited assistance Feeding assistance: Independent Dressing Assistance: Maximum  assistance Total Care Assistance: Maximum assistance   Functional Limitations Info  Sight, Hearing, Speech Sight Info: Adequate Hearing Info: Adequate Speech Info: Adequate    SPECIAL CARE FACTORS FREQUENCY  PT (By licensed PT), OT (By licensed OT)     PT Frequency: 5x per week OT Frequency: 5x per week            Contractures Contractures Info: Not present    Additional Factors Info  Code Status, Allergies Code Status Info: DNR Allergies Info: cipro, codeine, sulfamethoxazole-trimethoprin           Current Medications (04/09/2019):  This is the current hospital active medication list Current Facility-Administered Medications  Medication Dose Route Frequency Provider Last Rate Last Dose  . 0.9 %  sodium chloride infusion  250 mL Intravenous PRN Seals, Theo Dills, NP   Stopped at 04/08/19 0153  . acetaminophen (TYLENOL) tablet 650 mg  650 mg Oral Q6H PRN Seals, Theo Dills, NP   650 mg at 04/07/19 0104   Or  . acetaminophen (TYLENOL) suppository 650 mg  650 mg Rectal Q6H PRN Seals, Angela H, NP      . albuterol (PROVENTIL) (2.5 MG/3ML) 0.083% nebulizer solution 3 mL  3 mL Inhalation Q6H PRN Seals, Theo Dills, NP      . amiodarone (PACERONE) tablet 100 mg  100 mg Oral Daily Seals, Levada Dy H, NP   100 mg at 04/09/19 1937  . aspirin EC tablet 81 mg  81 mg Oral Daily Mayer Camel, NP   81 mg at 04/09/19 0920  . cefTRIAXone (ROCEPHIN) 1 g  in sodium chloride 0.9 % 100 mL IVPB  1 g Intravenous Q24H Seals, Angela H, NP 200 mL/hr at 04/08/19 1748 1 g at 04/08/19 1748  . ferrous sulfate tablet 325 mg  325 mg Oral Daily Seals, Theo Dills, NP   325 mg at 04/09/19 0920  . furosemide (LASIX) tablet 20 mg  20 mg Oral BID Gardiner Barefoot H, NP   20 mg at 04/09/19 3005  . gabapentin (NEURONTIN) capsule 300 mg  300 mg Oral TID Gardiner Barefoot H, NP   300 mg at 04/09/19 1102  . loratadine (CLARITIN) tablet 10 mg  10 mg Oral Daily Seals, Theo Dills, NP   10 mg at 04/09/19 1117  . meloxicam (MOBIC) tablet  7.5 mg  7.5 mg Oral Daily PRN Gardiner Barefoot H, NP   7.5 mg at 04/07/19 2040  . metoprolol tartrate (LOPRESSOR) tablet 25 mg  25 mg Oral BID Gardiner Barefoot H, NP   25 mg at 04/09/19 0920  . ondansetron (ZOFRAN) tablet 4 mg  4 mg Oral Q6H PRN Seals, Theo Dills, NP       Or  . ondansetron (ZOFRAN) injection 4 mg  4 mg Intravenous Q6H PRN Seals, Theo Dills, NP   4 mg at 04/07/19 2041  . pantoprazole (PROTONIX) EC tablet 40 mg  40 mg Oral Daily Loletha Grayer, MD   40 mg at 04/09/19 0921  . polyethylene glycol (MIRALAX / GLYCOLAX) packet 17 g  17 g Oral Daily PRN Gardiner Barefoot H, NP   17 g at 04/07/19 2041  . sodium chloride flush (NS) 0.9 % injection 3 mL  3 mL Intravenous Q12H Seals, Angela H, NP   3 mL at 04/08/19 2225  . sodium chloride flush (NS) 0.9 % injection 3 mL  3 mL Intravenous Q12H Seals, Angela H, NP   3 mL at 04/09/19 3567  . sodium chloride flush (NS) 0.9 % injection 3 mL  3 mL Intravenous PRN Seals, Levada Dy H, NP      . spironolactone (ALDACTONE) tablet 12.5 mg  12.5 mg Oral Daily Seals, Angela H, NP   12.5 mg at 04/09/19 0920     Discharge Medications: Please see discharge summary for a list of discharge medications.  Relevant Imaging Results:  Relevant Lab Results:   Additional Information ss #- [id number]  Latanya Maudlin, RN

## 2019-04-09 NOTE — Plan of Care (Signed)
  Problem: Urinary Elimination: Goal: Signs and symptoms of infection will decrease Outcome: Progressing   Problem: Education: Goal: Knowledge of General Education information will improve Description: Including pain rating scale, medication(s)/side effects and non-pharmacologic comfort measures Outcome: Progressing   Problem: Health Behavior/Discharge Planning: Goal: Ability to manage health-related needs will improve Outcome: Not Progressing Note: Patient can barely stand w/ PT. They recommend SNF, but family refuses and instead wants a hospital bed and care at home. Failed walking pulse ox sat test today. Will continue to monitor discharge progression. Wenda Low [facility]

## 2019-04-09 NOTE — Care Management Important Message (Signed)
Important Message  Patient Details  Name: Jessica Blackwell MRN: [id number] Date of Birth: 03/20/1925   Medicare Important Message Given:  Yes  Initial Medicare IM given by Patient Access Associate on 04/08/2019 at 11:43am.  Still valid.    Dannette Barbara 04/09/2019, 8:33 AM

## 2019-04-09 NOTE — Plan of Care (Signed)
  Problem: Urinary Elimination: ?Goal: Signs and symptoms of infection will decrease ?Outcome: Progressing ?  ?Problem: Education: ?Goal: Knowledge of General Education information will improve ?Description: Including pain rating scale, medication(s)/side effects and non-pharmacologic comfort measures ?Outcome: Progressing ?  ?

## 2019-04-09 NOTE — TOC Transition Note (Addendum)
Transition of Care [facility]) - CM/SW Discharge Note   Patient Details  Name: Jessica Blackwell MRN: [id number] Date of Birth: 01/30/1925  Transition of Care [facility]) CM/SW Contact:  Jessica Maudlin, RN Phone Number: 04/09/2019, 8:46 AM   Clinical Narrative:  Patient to be discharged per MD order. Orders in place for home health services. Patient active with [facility] and wishes to resume services. Notified Jessica Blackwell of discharge planning. Patient has all needed DME including rolling walker and transport wheelchair. Will see about transport, per patient they were able to take patient home last admission but she has not yet spoken with her daughter. Will use EMS if needed. Family has also worked on getting in home supports for 24/7 care. Patient also has new orders for oxygen, previously patient did not qualify but now room air sats are in the 80's. Per Jessica Blackwell with [employer] there may issues with new medicare guidelines as the CHF diagnosis itself may not be enough to cover. Will also get qualifying sat note.     *Update 10:50- patient was not able to stand with PT. As she is from home alone and without 24/7 coverage a home discharge does not seem safe at this time. We will workup for rehab per MD he has talked with daughter and their preference is [facility]. Will complete PASSAR/FL2 and send out for offers.  *Update 11:41- Daughter Jessica Blackwell plans to come here from her home in [place] to assist with her mothers care. They wish to talk amongst themselves in terms of home with home health or possibly a 2 week rehab stay. They are hesitant to commit to STR sue to COVID concerns but also understand she requires a high level of care. We have worked up the disposition foe either plan and once family has deliberated we will move forward with either plan.     Final next level of care: [place] Barriers to Discharge: No Barriers Identified   Patient Goals and CMS Choice Patient states their goals for  this hospitalization and ongoing recovery are:: "I'd like to return to home without Oxygen" CMS Medicare.gov Compare Post Acute Care list provided to:: Patient Choice offered to / list presented to : Patient  Discharge Placement                       Discharge Plan and Services   Discharge Planning Services: CM Consult Post Acute Care Choice: Resumption of Svcs/PTA Provider                    HH Arranged: RN, PT, Nurse's Aide [facility] Agency: [facility] Date [place]: 04/09/19 Time [place]: [phone] Representative spoke with at [place]: [place] ([place]) Interventions     Readmission Risk Interventions Readmission Risk Prevention Plan 04/09/2019  Post Dischage Appt Complete  Medication Screening Complete  Transportation Screening Complete  Some recent data might be hidden

## 2019-04-09 NOTE — Progress Notes (Addendum)
Physical Therapy Treatment Patient Details Name: Jessica Blackwell MRN: [id number] DOB: 02-27-1925 Today's Date: 04/09/2019    History of Present Illness Pt is a 85 year old female admitted for UTI. Pt reports complaints of weakness in legs causing fall during ambulation to car. Pt with recent admission for exacerbation of CHF 1 week ago. History includes aortic stenosis, CHF, GERD, and HTN.     PT Comments    Patient alert, in bed, reported that she was frustrated that she had not been out of bed yet during her hospital stay. spO2 assessed throughout session, placed on 2.5L via [place] for mobility to maintain saturation levels, RN notified. Pt transferred to EOB with CGA and heavy use of bed rails. Sit <> stand x2 this session with RW and min-modA, unable to complete transfer at this time due to incontinence (safety concerned for wet floor, pt anxious). Pt informed PT can try to come back later in the PM to re-attempt and that nursing tech may also attempt later as well. The patient continues to demonstrate limitations in functional mobility and needed continued physical assist, recommendation is STR due to acute decline. PT and pt discussed discharge options, still resistant to the idea of rehab but PT informed patient that she will need 24/7 physical assist if she was to go home.   Note attended to include attempt to transfer to chair this PM. modAx2 with RW and with handheld assist, pt unable to keep feet from slipping on the floor or take any steps towards chair. Squat pivot transfer was going to be attempted but pt very anxious and concerned about transfer, further mobility deferred. Bed placed in chair position at end of session.    Follow Up Recommendations  SNF     Equipment Recommendations  None recommended by PT    Recommendations for Other Services       Precautions / Restrictions Precautions Precautions: Fall Precaution Comments: oxygen Restrictions Weight Bearing Restrictions:  No    Mobility  Bed Mobility Overal bed mobility: Needs Assistance Bed Mobility: Supine to Sit;Sit to Supine     Supine to sit: HOB elevated;Min guard Sit to supine: Mod assist   General bed mobility comments: heavy use of bed rails to transfer to sit EOB, modA to return to supine for LE management  Transfers Overall transfer level: Needs assistance Equipment used: Rolling walker (2 wheeled) Transfers: Sit to/from Stand Sit to Stand: Min assist;Mod assist         General transfer comment: first attempted pt minA with RW, difficulty standing upright, and pt incontinent. returned to sitting, floor cleaned. Second attempt with RW and modA, pt fatigued and anxious to take a step.  Ambulation/Gait             General Gait Details: unable   Stairs             Wheelchair Mobility    Modified Rankin (Stroke Patients Only)       Balance Overall balance assessment: Needs assistance Sitting-balance support: Feet supported;Bilateral upper extremity supported Sitting balance-Leahy Scale: Fair       Standing balance-Leahy Scale: Poor                              Cognition Arousal/Alertness: Awake/alert Behavior During Therapy: WFL for tasks assessed/performed Overall Cognitive Status: Within Functional Limits for tasks assessed  Exercises      General Comments General comments (skin integrity, edema, etc.): placed on 2.5L for mobility      Pertinent Vitals/Pain Pain Assessment: No/denies pain    Home Living                      Prior Function            PT Goals (current goals can now be found in the care plan section) Progress towards PT goals: Progressing toward goals    Frequency    Min 2X/week      PT Plan Current plan remains appropriate    Co-evaluation              AM-PAC PT "6 Clicks" Mobility   Outcome Measure  Help needed turning from your  back to your side while in a flat bed without using bedrails?: A Lot Help needed moving from lying on your back to sitting on the side of a flat bed without using bedrails?: A Lot Help needed moving to and from a bed to a chair (including a wheelchair)?: A Lot Help needed standing up from a chair using your arms (e.g., wheelchair or bedside chair)?: A Lot Help needed to walk in hospital room?: A Lot Help needed climbing 3-5 steps with a railing? : Total 6 Click Score: 11    End of Session Equipment Utilized During Treatment: Oxygen Activity Tolerance: Patient limited by fatigue Patient left: in bed;with bed alarm set Nurse Communication: Mobility status PT Visit Diagnosis: Unsteadiness on feet (R26.81);Other abnormalities of gait and mobility (R26.89);Muscle weakness (generalized) (M62.81);Difficulty in walking, not elsewhere classified (R26.2)     Time: 9311-2162 PT Time Calculation (min) (ACUTE ONLY): 31 min  Charges:  $Therapeutic Exercise: 23-37 mins                    Lieutenant Diego PT, DPT 10:52 AM,04/09/19 [id number]

## 2019-04-09 NOTE — Progress Notes (Signed)
SATURATION QUALIFICATIONS: (This note is used to comply with regulatory documentation for home oxygen)  Patient Saturations on Room Air at Rest = 87%  Patient Saturations on Room Air while Ambulating = N/A%  Patient Saturations on 2 Liters of oxygen while Ambulating = 91% (in the bed)  Please briefly explain why patient needs home oxygen: [place]

## 2019-04-09 NOTE — Progress Notes (Signed)
Patient suffers from CHF and has trouble breathing at night when head of the bed is less than 30 degrees. Bed wedges do not provide enough elevation to resolve breathing issues. Shortness of breath causes patient to require frequent and immediate changes in body position which cannot be achieved with a normal bed.

## 2019-04-10 LAB — URINE CULTURE
Culture: >=100000 — AB
Special Requests: NORMAL

## 2019-04-10 NOTE — Progress Notes (Signed)
SpO2 on RA x10 min during bath--76% SpO2 on 2L/min [place]--84% SpO2 on 3L/min [place]--89%

## 2019-04-10 NOTE — Progress Notes (Signed)
Discussed with pt the plan for discharge today once the oxygen and DME has been delivered to her home.  Discussed plan for her family to provide around the clock care with support from home health.  Pt voiced concern about how she will use the bathroom at home as she currently is non-ambulatory.  Discussed options of incontinence briefs, bedpan, and BSC.  Further options will need to be assessed by family and home health nurse once she is at home.

## 2019-04-10 NOTE — Plan of Care (Signed)
Pt ready for discharge home.   Problem: Urinary Elimination: Goal: Signs and symptoms of infection will decrease 04/10/2019 1444 by Aubery Lapping, RN Outcome: Completed/Met 04/10/2019 1042 by Aubery Lapping, RN Outcome: Progressing   Problem: Education: Goal: Knowledge of General Education information will improve Description: Including pain rating scale, medication(s)/side effects and non-pharmacologic comfort measures 04/10/2019 1444 by Aubery Lapping, RN Outcome: Completed/Met 04/10/2019 1042 by Aubery Lapping, RN Outcome: Progressing   Problem: Health Behavior/Discharge Planning: Goal: Ability to manage health-related needs will improve 04/10/2019 1444 by Aubery Lapping, RN Outcome: Completed/Met 04/10/2019 1042 by Aubery Lapping, RN Outcome: Progressing   Problem: Clinical Measurements: Goal: Ability to maintain clinical measurements within normal limits will improve 04/10/2019 1444 by Aubery Lapping, RN Outcome: Completed/Met 04/10/2019 1042 by Aubery Lapping, RN Outcome: Progressing Goal: Will remain free from infection 04/10/2019 1444 by Aubery Lapping, RN Outcome: Completed/Met 04/10/2019 1042 by Aubery Lapping, RN Outcome: Progressing Goal: Diagnostic test results will improve 04/10/2019 1444 by Aubery Lapping, RN Outcome: Completed/Met 04/10/2019 1042 by Aubery Lapping, RN Outcome: Progressing Goal: Respiratory complications will improve 04/10/2019 1444 by Aubery Lapping, RN Outcome: Completed/Met 04/10/2019 1042 by Aubery Lapping, RN Outcome: Progressing Goal: Cardiovascular complication will be avoided 04/10/2019 1444 by Aubery Lapping, RN Outcome: Completed/Met 04/10/2019 1042 by Aubery Lapping, RN Outcome: Progressing   Problem: Activity: Goal: Risk for activity intolerance will decrease 04/10/2019 1444 by Aubery Lapping, RN Outcome: Completed/Met 04/10/2019 1042 by Aubery Lapping, RN Outcome: Progressing   Problem: Nutrition: Goal: Adequate nutrition will be maintained 04/10/2019 1444 by Aubery Lapping, RN Outcome: Completed/Met 04/10/2019 1042 by Aubery Lapping, RN Outcome: Progressing   Problem: Coping: Goal: Level of anxiety will decrease 04/10/2019 1444 by Aubery Lapping, RN Outcome: Completed/Met 04/10/2019 1042 by Aubery Lapping, RN Outcome: Progressing   Problem: Elimination: Goal: Will not experience complications related to bowel motility 04/10/2019 1444 by Aubery Lapping, RN Outcome: Completed/Met 04/10/2019 1042 by Aubery Lapping, RN Outcome: Progressing Goal: Will not experience complications related to urinary retention 04/10/2019 1444 by Aubery Lapping, RN Outcome: Completed/Met 04/10/2019 1042 by Aubery Lapping, RN Outcome: Progressing   Problem: Pain Managment: Goal: General experience of comfort will improve 04/10/2019 1444 by Aubery Lapping, RN Outcome: Completed/Met 04/10/2019 1042 by Aubery Lapping, RN Outcome: Progressing   Problem: Safety: Goal: Ability to remain free from injury will improve 04/10/2019 1444 by Aubery Lapping, RN Outcome: Completed/Met 04/10/2019 1042 by Aubery Lapping, RN Outcome: Progressing   Problem: Skin Integrity: Goal: Risk for impaired skin integrity will decrease 04/10/2019 1444 by Aubery Lapping, RN Outcome: Completed/Met 04/10/2019 1042 by Aubery Lapping, RN Outcome: Progressing   Problem: Acute Rehab PT Goals(only PT should resolve) Goal: Pt Will Ambulate Outcome: Completed/Met Goal: Pt/caregiver will Perform Home Exercise Program Outcome: Completed/Met Goal: PT Additional Goal #1 Outcome: Completed/Met

## 2019-04-10 NOTE — Plan of Care (Signed)

## 2019-04-10 NOTE — TOC Transition Note (Signed)
Transition of Care [facility]) - CM/SW Discharge Note   Patient Details  Name: CASSARA NIDA MRN: [id number] Date of Birth: December 06, 1924  Transition of Care [facility]) CM/SW Contact:  Latanya Maudlin, RN Phone Number: 04/10/2019, 8:59 AM   Clinical Narrative:  Disposition was completed yesterday. Family has ultimately decided to take patient home and provide around the clock care themselves in addition to home health. Patient will require full time oxygen as well as a DME hospital bed. Orders are in place, Per [employer] delivery scheduled for 2 pm. Once DME is in place we will use EMS for transport.       Final next level of care: [place] Barriers to Discharge: No Barriers Identified   Patient Goals and CMS Choice Patient states their goals for this hospitalization and ongoing recovery are:: "I'd like to return to home without Oxygen" CMS Medicare.gov Compare Post Acute Care list provided to:: Patient Choice offered to / list presented to : Patient  Discharge Placement                       Discharge Plan and Services   Discharge Planning Services: CM Consult Post Acute Care Choice: Resumption of Svcs/PTA Provider          DME Arranged: Oxygen DME Agency: [facility] Date DME Agency Contacted: 04/09/19 Time DME Agency Contacted: [phone] Representative spoke with at DME Agency: brad HH Arranged: RN, PT, Nurse's Aide [place] Agency: [facility] Date [place]: 04/09/19 Time [place]: [phone] Representative spoke with at [place]: [place] ([place]) Interventions     Readmission Risk Interventions Readmission Risk Prevention Plan 04/09/2019  Post Dischage Appt Complete  Medication Screening Complete  Transportation Screening Complete  Some recent data might be hidden

## 2019-04-10 NOTE — Discharge Summary (Signed)
[place] at [facility], 85 y.o., DOB 07/08/1925, MRN [id number]. Admission date: 04/06/2019 Discharge Date 04/10/2019 Primary MD Idelle Crouch, MD Admitting Physician Christel Mormon, MD  Admission Diagnosis  Acute on chronic respiratory failure with hypoxia ([place]) [J96.21] Urinary tract infection without hematuria, site unspecified [N39.0]  Discharge Diagnosis   Active Problems: Acute cystitis Generalized weakness Abdominal pain and diarrhea Chronic systolic CHF Paroxysmal atrial fibrillation Neuropathy Essential hypertension Thrombocytopenia Acute on chronic hypoxic respiratory failure patient being discharged home on oxygen Severe aortic stenosis   Hospital Course  Caroll Weinheimer  is a 85 y.o. female with a known history of CHF with aortic stenosis presents with swelling of the feet and legs and shortness of breath.  She has been feeling very weak.  She is short of breath especially when she is moving around.  She cannot get around like she used to.  She lives alone.  Some nausea.  She does get diarrhea often.  In the ER she had a chest x-ray that showed mild interstitial prominence could be pulmonary vascular congestion versus atypical infection.  She has new patchy opacities at the bases.  Hospitalist services contacted for further evaluation.  Patient was admitted for further evaluation and treatment.  She was diuresed.  Patient also was noted to have acute cystitis which was treated.  Her prognosis is very poor with her severe aortic stenosis. Pt was very weak and recommended go to rehab however patient stated that she did not want to go to rehab.  Patient's daughter is here from [facility] so home health has been arranged patient also has a hospital bed etc. arrange for home.             Consults  None  Significant Tests:  See full reports for all details    Dg Chest 2 View  Result Date: 04/06/2019 CLINICAL DATA:  Hypoxia.  Weakness. The patient fell while walking to her car. EXAM: CHEST - 2 VIEW COMPARISON:  Chest x-rays dated 03/26/2019 and 05/21/2018 and 07/28/2015 and CT scan dated 01/15/2013 FINDINGS: There is stable cardiomegaly. There is chronic massive enlargement of the main pulmonary artery and of the right main pulmonary artery. There is chronic scarring at the lung bases. 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 No effusions. No appreciable acute bone abnormality. IMPRESSION: 1. No acute cardiopulmonary disease. 2. Chronic massive enlargement of the main pulmonary artery and of the right main pulmonary artery. 3. 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 4. No acute rib fracture or bone destruction. Electronically Signed   By: Lorriane Shire M.D.   On: 04/06/2019 16:10   Dg Chest Portable 1 View  Result Date: 03/26/2019 CLINICAL DATA:  Shortness of breath.  Crackles. EXAM: PORTABLE CHEST 1 VIEW COMPARISON:  CT scan May 2014. Multiple chest x-rays since July 28, 2015. FINDINGS: The patient's known left pleural based mass seen on the CT scanner 2014 is similar in the interval measuring 3.3 cm today, unchanged since September 2018. Stable cardiomegaly. The cardiomediastinal silhouette is stable. The contour abnormality in the left side of the mediastinum is consistent with an enlarged pulmonary artery seen on previous CT imaging, similar in the interval. The findings suggest pulmonary arterial hypertension. No pneumothorax. Increased interstitial markings in the lungs. Patchy opacities in the bases. IMPRESSION: 1. New patchy opacities in the  bases may represent atelectasis or developing infiltrate such as pneumonia or aspiration. Recommend clinical correlation and follow-up to resolution. 2. Mild increased interstitial prominence suggest pulmonary venous congestion versus atypical infection. 3. Stable  left pleural base mass. 4. Stable enlarged pulmonary artery consistent with pulmonary arterial hypertension. Electronically Signed   By: Dorise Bullion III M.D   On: 03/26/2019 17:47   Dg Abd 2 Views  Result Date: 04/07/2019 CLINICAL DATA:  Increasing weakness over the past 3-4 days. Severe right lower quadrant pain. EXAM: ABDOMEN - 2 VIEW COMPARISON:  Two views of the abdomen 05/21/2018. CT abdomen and pelvis 05/20/2018. FINDINGS: The bowel gas pattern is normal. There is no evidence of free air. Moderate volume of stool throughout the colon is noted. No radio-opaque calculi or other significant radiographic abnormality is seen. Left basilar atelectasis or scar is seen. IMPRESSION: No acute finding. Moderate colonic stool burden. Electronically Signed   By: Inge Rise M.D.   On: 04/07/2019 09:15       Today   Subjective:   Andria Meuse patient denies any complaints Objective:   Blood pressure (!) 120/58, pulse (!) 59, temperature 98.6 F (37 C), temperature source Oral, resp. rate 18, height 5\' 1"  (1.549 m), weight 67.5 kg, SpO2 94 %.  .  Intake/Output Summary (Last 24 hours) at 04/10/2019 1351 Last data filed at 04/10/2019 0905 Gross per 24 hour  Intake 480 ml  Output 1050 ml  Net -570 ml    Exam VITAL SIGNS: Blood pressure (!) 120/58, pulse (!) 59, temperature 98.6 F (37 C), temperature source Oral, resp. rate 18, height 5\' 1"  (1.549 m), weight 67.5 kg, SpO2 94 %.  GENERAL:  85 y.o.-year-old patient lying in the bed with no acute distress.  EYES: Pupils equal, round, reactive to light and accommodation. No scleral icterus. Extraocular muscles intact.  HEENT: Head atraumatic, normocephalic. Oropharynx and nasopharynx clear.  NECK:  Supple, no jugular venous distention. No thyroid enlargement, no tenderness.  LUNGS: Normal breath sounds bilaterally, no wheezing, rales,rhonchi or crepitation. No use of accessory muscles of respiration.  CARDIOVASCULAR: S1, S2 normal.   Positive murmurs, rubs, or gallops.  ABDOMEN: Soft, nontender, nondistended. Bowel sounds present. No organomegaly or mass.  EXTREMITIES: No pedal edema, cyanosis, or clubbing.  NEUROLOGIC: Cranial nerves II through XII are intact. Muscle strength 5/5 in all extremities. Sensation intact. Gait not checked.  PSYCHIATRIC: The patient is alert and oriented x 3.  SKIN: No obvious rash, lesion, or ulcer.   Data Review     CBC w Diff:  Lab Results  Component Value Date   WBC 5.6 04/09/2019   HGB 13.3 04/09/2019   HGB 12.0 06/04/2012   HCT 44.2 04/09/2019   HCT 33.9 (L) 07/28/2015   PLT 74 (L) 04/09/2019   PLT 115 (L) 06/04/2012   LYMPHOPCT 30 03/26/2019   LYMPHOPCT 27.4 06/04/2012   MONOPCT 9 03/26/2019   MONOPCT 11.2 06/04/2012   EOSPCT 1 03/26/2019   EOSPCT 3.0 06/04/2012   BASOPCT 2 03/26/2019   BASOPCT 2.5 06/04/2012   CMP:  Lab Results  Component Value Date   NA 140 04/07/2019   NA 141 06/04/2012   K 4.4 04/07/2019   K 4.1 06/04/2012   CL 106 04/07/2019   CL 108 (H) 06/04/2012   CO2 26 04/07/2019   CO2 25 06/04/2012   BUN 18 04/07/2019   BUN 17 06/04/2012   CREATININE 0.80 04/07/2019   CREATININE 0.75 06/04/2012   PROT 6.6 04/06/2019  PROT 6.8 06/02/2012   ALBUMIN 4.0 04/06/2019   ALBUMIN 3.9 06/02/2012   BILITOT 1.1 04/06/2019   BILITOT 0.3 06/02/2012   ALKPHOS 38 04/06/2019   ALKPHOS 83 06/02/2012   AST 14 (L) 04/06/2019   AST 20 06/02/2012   ALT 10 04/06/2019   ALT 22 06/02/2012  .  Micro Results Recent Results (from the past 240 hour(s))  SARS Coronavirus 2 (CEPHEID - Performed in [place] hospital lab), Hosp Order     Status: None   Collection Time: 04/06/19  3:36 PM   Specimen: Nasopharyngeal Swab  Result Value Ref Range Status   SARS Coronavirus 2 NEGATIVE NEGATIVE Final    Comment: (NOTE) If result is NEGATIVE SARS-CoV-2 target nucleic acids are NOT DETECTED. The SARS-CoV-2 RNA is generally detectable in upper and lower  respiratory  specimens during the acute phase of infection. The lowest  concentration of SARS-CoV-2 viral copies this assay can detect is 250  copies / mL. A negative result does not preclude SARS-CoV-2 infection  and should not be used as the sole basis for treatment or other  patient management decisions.  A negative result may occur with  improper specimen collection / handling, submission of specimen other  than nasopharyngeal swab, presence of viral mutation(s) within the  areas targeted by this assay, and inadequate number of viral copies  (<250 copies / mL). A negative result must be combined with clinical  observations, patient history, and epidemiological information. If result is POSITIVE SARS-CoV-2 target nucleic acids are DETECTED. The SARS-CoV-2 RNA is generally detectable in upper and lower  respiratory specimens dur ing the acute phase of infection.  Positive  results are indicative of active infection with SARS-CoV-2.  Clinical  correlation with patient history and other diagnostic information is  necessary to determine patient infection status.  Positive results do  not rule out bacterial infection or co-infection with other viruses. If result is PRESUMPTIVE POSTIVE SARS-CoV-2 nucleic acids MAY BE PRESENT.   A presumptive positive result was obtained on the submitted specimen  and confirmed on repeat testing.  While 2019 novel coronavirus  (SARS-CoV-2) nucleic acids may be present in the submitted sample  additional confirmatory testing may be necessary for epidemiological  and / or clinical management purposes  to differentiate between  SARS-CoV-2 and other Sarbecovirus currently known to infect humans.  If clinically indicated additional testing with an alternate test  methodology [phone]) is advised. The SARS-CoV-2 RNA is generally  detectable in upper and lower respiratory sp ecimens during the acute  phase of infection. The expected result is Negative. Fact Sheet for  Patients:  StrictlyIdeas.no Fact Sheet for Healthcare Providers: BankingDealers.co.za This test is not yet approved or cleared by the [place] FDA and has been authorized for detection and/or diagnosis of SARS-CoV-2 by FDA under an Emergency Use Authorization (EUA).  This EUA will remain in effect (meaning this test can be used) for the duration of the COVID-19 declaration under Section 564(b)(1) of the Act, 21 U.S.C. section 360bbb-3(b)(1), unless the authorization is terminated or revoked sooner. Performed at [facility], [address], [place], [place] [address]   Urine culture     Status: Abnormal   Collection Time: 04/06/19  5:02 PM   Specimen: Urine, Random  Result Value Ref Range Status   Specimen Description   Final    URINE, RANDOM Performed at [facility], [address] [place], [place] [address]    Special Requests   Final  Normal Performed at [facility], [address] [place], [place] [address]    Culture (A)  Final    >=100,000 COLONIES/mL KLEBSIELLA PNEUMONIAE Confirmed Extended Spectrum Beta-Lactamase Producer (ESBL).  In bloodstream infections from ESBL organisms, carbapenems are preferred over piperacillin/tazobactam. They are shown to have a lower risk of mortality. >=100,000 COLONIES/mL ESCHERICHIA COLI    Report Status 04/10/2019 FINAL  Final   Organism ID, Bacteria KLEBSIELLA PNEUMONIAE (A)  Final   Organism ID, Bacteria ESCHERICHIA COLI (A)  Final      Susceptibility   Escherichia coli - MIC*    AMPICILLIN >=32 RESISTANT Resistant     CEFAZOLIN >=64 RESISTANT Resistant     CEFTRIAXONE >=64 RESISTANT Resistant     CIPROFLOXACIN <=0.25 SENSITIVE Sensitive     GENTAMICIN <=1 SENSITIVE Sensitive     IMIPENEM <=0.25 SENSITIVE Sensitive     NITROFURANTOIN >=512 RESISTANT Resistant     TRIMETH/SULFA <=20 SENSITIVE Sensitive     AMPICILLIN/SULBACTAM >=32 RESISTANT  Resistant     PIP/TAZO 64 INTERMEDIATE Intermediate     Extended ESBL NEGATIVE Sensitive     * >=100,000 COLONIES/mL ESCHERICHIA COLI   Klebsiella pneumoniae - MIC*    AMPICILLIN >=32 RESISTANT Resistant     CEFAZOLIN >=64 RESISTANT Resistant     CEFTRIAXONE >=64 RESISTANT Resistant     CIPROFLOXACIN 0.5 SENSITIVE Sensitive     GENTAMICIN <=1 SENSITIVE Sensitive     IMIPENEM <=0.25 SENSITIVE Sensitive     NITROFURANTOIN >=512 RESISTANT Resistant     TRIMETH/SULFA <=20 SENSITIVE Sensitive     AMPICILLIN/SULBACTAM >=32 RESISTANT Resistant     PIP/TAZO 64 INTERMEDIATE Intermediate     Extended ESBL POSITIVE Resistant     * >=100,000 COLONIES/mL KLEBSIELLA PNEUMONIAE        Code Status Orders  (From admission, onward)         Start     Ordered   04/07/19 0017  Do not attempt resuscitation (DNR)  Continuous    Question Answer Comment  In the event of cardiac or respiratory ARREST Do not call a "code blue"   In the event of cardiac or respiratory ARREST Do not perform Intubation, CPR, defibrillation or ACLS   In the event of cardiac or respiratory ARREST Use medication by any route, position, wound care, and other measures to relive pain and suffering. May use oxygen, suction and manual treatment of airway obstruction as needed for comfort.   Comments nurse may pronounce      04/07/19 0017        Code Status History    Date Active Date Inactive Code Status Order ID Comments User Context   04/07/2019 0017 04/07/2019 0017 DNR [id number]  Mayer Camel, NP Inpatient   03/26/2019 1948 03/28/2019 1814 DNR [id number]  Loletha Grayer, MD ED   05/21/2018 2003 05/26/2018 2013 Full Code [id number]  Fritzi Mandes, MD Inpatient   05/20/2018 1418 05/21/2018 2003 DNR [id number]  Loletha Grayer, MD ED   12/22/2016 2052 12/23/2016 1512 Full Code [id number]  Quintella Baton, MD Inpatient   07/28/2015 0638 08/02/2015 1844 Full Code [id number]  Norval Morton, MD Inpatient   Advance Care Planning Activity     Advance Directive Documentation     Most Recent Value  Type of Advance Directive  Healthcare Power of Attorney  Pre-existing out of facility DNR order (yellow form or pink MOST form)  -  "MOST" Form in Place?  -          Follow-up  Information    Whitaker, [employer], PA-C. Go on 04/15/2019.   Specialty: Family Medicine Why: at [facility] information: [place] [address] [phone]           Discharge Medications   Allergies as of 04/10/2019      Reactions   Ciprofloxacin Itching, Rash   Codeine Other (See Comments)   Upset stomach   Sulfamethoxazole-trimethoprim Rash      Medication List    STOP taking these medications   metFORMIN 500 MG tablet Commonly known as: GLUCOPHAGE     TAKE these medications   acetaminophen 325 MG tablet Commonly known as: TYLENOL Take 2 tablets (650 mg total) by mouth every 6 (six) hours as needed for mild pain (or Fever >/= 101).   albuterol 108 (90 Base) MCG/ACT inhaler Commonly known as: VENTOLIN HFA Inhale 2 puffs into the lungs every 6 (six) hours as needed for wheezing or shortness of breath.   amiodarone 200 MG tablet Commonly known as: PACERONE Take 0.5 tablets (100 mg total) by mouth daily.   aspirin EC 81 MG tablet Take 81 mg by mouth daily.   cephALEXin 250 MG capsule Commonly known as: KEFLEX Take 1 capsule (250 mg total) by mouth 3 (three) times daily for 2 days.   cetirizine 10 MG tablet Commonly known as: ZYRTEC Take 10 mg by mouth daily.   ferrous sulfate 325 (65 FE) MG tablet Take 325 mg by mouth daily.   furosemide 20 MG tablet Commonly known as: LASIX Take 20 mg by mouth 2 (two) times daily.   gabapentin 300 MG capsule Commonly known as: NEURONTIN Take 300 mg by mouth 3 (three) times daily.   meloxicam 7.5 MG tablet Commonly known as: MOBIC Take 1 tablet (7.5 mg total) by mouth daily as needed (pain). What changed:   when to take this  reasons to take  this   metoprolol tartrate 25 MG tablet Commonly known as: LOPRESSOR Take 1 tablet (25 mg total) by mouth 2 (two) times daily.   pantoprazole 40 MG tablet Commonly known as: PROTONIX Take 1 tablet (40 mg total) by mouth daily. What changed: when to take this   polyethylene glycol 17 g packet Commonly known as: MIRALAX / GLYCOLAX Take 17 g by mouth daily as needed for severe constipation.   spironolactone 25 MG tablet Commonly known as: ALDACTONE Take 12.5 mg by mouth daily.            Durable Medical Equipment  (From admission, onward)         Start     Ordered   04/10/19 1314  For home use only DME Other see comment  Once    Comments: Harrel Lemon lift  Question:  Length of Need  Answer:  Lifetime   04/10/19 1313   04/10/19 1114  For home use only DME oxygen  Once    Question Answer Comment  Length of Need Lifetime   Mode or (Route) Nasal cannula   Liters per Minute 3   Frequency Continuous (stationary and portable oxygen unit needed)   Oxygen conserving device Yes   Oxygen delivery system Gas      04/10/19 1113   04/10/19 1113  DME Oxygen  Once    Question Answer Comment  Length of Need Lifetime   Mode or (Route) Nasal cannula   Liters per Minute 3   Frequency Continuous (stationary and portable oxygen unit needed)   Oxygen delivery system Gas  04/10/19 1113   04/09/19 1353  For home use only DME Hospital bed  Once    Question Answer Comment  Length of Need Lifetime   Patient has (list medical condition): unable to walk. neuropathy   Bed type Semi-electric   Support Surface: Gel Overlay      04/09/19 1353             Total Time in preparing paper work, data evaluation and todays exam - 82 minutes  Dustin Flock M.D on 04/10/2019 at [place]  [phone]

## 2019-04-10 NOTE — Progress Notes (Signed)
Discharge instructions reviewed with pt and daughter, Roxine Caddy.  All questions addressed.  Understanding verified through teach back.  Awaiting transportation home via non-emergent EMS.

## 2019-04-12 NOTE — Care Management (Addendum)
Post discharge not entry 04/12/19 @1258 : call received from daughter [name] [phone] requesting SNF. She states patient now agrees. I explained that I would try to send FL2 again however it may need to go through PCP and [facility] home health.  She states she has left message for DR. Sparks and also with [place] with [employer]. She would appreciate any help she can get. She has asked that [employer] pick up hospital bed as patient is unable to sleep in it and has started sleeping in recliner.  I have notified Brad with [employer] of this request. Update: Spoke with [place] with [facility] and added Education officer, museum (Verbal order from Dr. Curly Rim) to assistance with SNF placement. Marye Round is going to reach out to [facility] as [name] believes they did offer on day of discharge. Patient had bed offers from all facilities at time of discharge (except [place]).

## 2019-04-14 ENCOUNTER — Ambulatory Visit: Payer: Medicare Other | Admitting: Family

## 2019-04-14 ENCOUNTER — Other Ambulatory Visit: Payer: Self-pay | Admitting: Physician Assistant

## 2019-04-14 ENCOUNTER — Telehealth: Payer: Self-pay | Admitting: Family

## 2019-04-14 NOTE — Progress Notes (Deleted)
   Patient ID: Jessica Blackwell, female    DOB: 08/20/1925, 85 y.o.   MRN: [id number]  HPI  Jessica Blackwell is a 85 y/o female with a history of  Echo report from 03/28/2019 reviewed and showed an EF of 45-50% along with mild/moderate TR, moderate/severe AS and trivial AR.   Admitted 04/06/2019 due to acute on chronic HF. Given IV lasix initially. Also treated for UTI. Rehab recommended but patient declined. Discharged after 4 days. Admitted 03/26/2019 due to acute on chronic HF. Initially needed IV lasix. Cardiology consult obtained. Antibiotics given for possible pneumonia. Discharged after 2 days.   She presents today for her initial visit with a chief complaint of   Review of Systems    Physical Exam    Assessment & Plan:  1: Chronic heart failure with mildly reduced ejection fraction- - NYHA class - BNP 04/06/2019 was 337.0  2: HTN- - BP - saw PCP (Sparks) 10/14/2018 - BMP 04/07/2019 reviewed and showed sodium 140, potassium 4.4, creatinine 0.8 and GFR >60  3: Aortic stenosis- - saw cardiology (Paraschos) 02/19/2019

## 2019-04-14 NOTE — Telephone Encounter (Signed)
Patient did not show for her Heart Failure Clinic appointment on 04/14/2019. Will attempt to reschedule.

## 2019-04-15 ENCOUNTER — Telehealth: Payer: Self-pay | Admitting: Nurse Practitioner

## 2019-04-15 NOTE — Telephone Encounter (Signed)
Spoke with daughter Otho Perl regarding Palliative services and after a lengthy discussion about what was going on with her Mom she requested that I call her sister Roxine Caddy who is temporarily staying with the patient from [facility].

## 2019-04-15 NOTE — Telephone Encounter (Signed)
Spoke with daughter Roxine Caddy regarding Palliative services, she had a lot of questions about what services Palliative provided.  She stated that her Mom needed around the clock sitters and I told her that we did not provide that, she said she has 2-3 companies to call and try to get this for her Mom.  She also stated that her Mom really needs to be in a rehab facility but is unable to get her into one due to neeeding to have a negative COVID test X2 before they will accept her and she doesn't know where to go to get this done.  She wanted to know what the differences were between the home health and Palliative - which I explained to her, the best I could.  She finally agreed to have the Nurse Practitioner call and do a Telephone Consult which was scheduled for 04/19/19 at 12 Noon.

## 2019-04-16 ENCOUNTER — Observation Stay
Admission: EM | Admit: 2019-04-16 | Discharge: 2019-04-20 | Disposition: A | Discharge status: Skilled Nursing Facility | Payer: Medicare Other | Attending: Internal Medicine | Admitting: Internal Medicine

## 2019-04-16 ENCOUNTER — Other Ambulatory Visit: Payer: Self-pay

## 2019-04-16 ENCOUNTER — Encounter: Payer: Self-pay | Admitting: Emergency Medicine

## 2019-04-16 DIAGNOSIS — M1990 Unspecified osteoarthritis, unspecified site: Secondary | ICD-10-CM | POA: Diagnosis not present

## 2019-04-16 DIAGNOSIS — Z885 Allergy status to narcotic agent status: Secondary | ICD-10-CM | POA: Diagnosis not present

## 2019-04-16 DIAGNOSIS — Z882 Allergy status to sulfonamides status: Secondary | ICD-10-CM | POA: Diagnosis not present

## 2019-04-16 DIAGNOSIS — D696 Thrombocytopenia, unspecified: Secondary | ICD-10-CM | POA: Insufficient documentation

## 2019-04-16 DIAGNOSIS — I480 Paroxysmal atrial fibrillation: Secondary | ICD-10-CM | POA: Diagnosis not present

## 2019-04-16 DIAGNOSIS — Z881 Allergy status to other antibiotic agents status: Secondary | ICD-10-CM | POA: Insufficient documentation

## 2019-04-16 DIAGNOSIS — I350 Nonrheumatic aortic (valve) stenosis: Secondary | ICD-10-CM | POA: Insufficient documentation

## 2019-04-16 DIAGNOSIS — Z515 Encounter for palliative care: Secondary | ICD-10-CM | POA: Diagnosis not present

## 2019-04-16 DIAGNOSIS — Z7982 Long term (current) use of aspirin: Secondary | ICD-10-CM | POA: Diagnosis not present

## 2019-04-16 DIAGNOSIS — R197 Diarrhea, unspecified: Secondary | ICD-10-CM | POA: Insufficient documentation

## 2019-04-16 DIAGNOSIS — Z66 Do not resuscitate: Secondary | ICD-10-CM | POA: Insufficient documentation

## 2019-04-16 DIAGNOSIS — R531 Weakness: Secondary | ICD-10-CM

## 2019-04-16 DIAGNOSIS — Z79899 Other long term (current) drug therapy: Secondary | ICD-10-CM | POA: Insufficient documentation

## 2019-04-16 DIAGNOSIS — J9621 Acute and chronic respiratory failure with hypoxia: Secondary | ICD-10-CM | POA: Insufficient documentation

## 2019-04-16 DIAGNOSIS — G629 Polyneuropathy, unspecified: Secondary | ICD-10-CM | POA: Diagnosis not present

## 2019-04-16 DIAGNOSIS — Z87440 Personal history of urinary (tract) infections: Secondary | ICD-10-CM | POA: Diagnosis not present

## 2019-04-16 DIAGNOSIS — N390 Urinary tract infection, site not specified: Secondary | ICD-10-CM | POA: Diagnosis not present

## 2019-04-16 DIAGNOSIS — I110 Hypertensive heart disease with heart failure: Secondary | ICD-10-CM | POA: Diagnosis not present

## 2019-04-16 DIAGNOSIS — Z20828 Contact with and (suspected) exposure to other viral communicable diseases: Secondary | ICD-10-CM | POA: Insufficient documentation

## 2019-04-16 DIAGNOSIS — R911 Solitary pulmonary nodule: Secondary | ICD-10-CM | POA: Diagnosis not present

## 2019-04-16 DIAGNOSIS — K5900 Constipation, unspecified: Secondary | ICD-10-CM | POA: Insufficient documentation

## 2019-04-16 DIAGNOSIS — K219 Gastro-esophageal reflux disease without esophagitis: Secondary | ICD-10-CM | POA: Insufficient documentation

## 2019-04-16 DIAGNOSIS — I5022 Chronic systolic (congestive) heart failure: Secondary | ICD-10-CM | POA: Insufficient documentation

## 2019-04-16 DIAGNOSIS — Z8249 Family history of ischemic heart disease and other diseases of the circulatory system: Secondary | ICD-10-CM | POA: Insufficient documentation

## 2019-04-16 DIAGNOSIS — B961 Klebsiella pneumoniae [K. pneumoniae] as the cause of diseases classified elsewhere: Secondary | ICD-10-CM | POA: Insufficient documentation

## 2019-04-16 LAB — URINALYSIS, COMPLETE (UACMP) WITH MICROSCOPIC
Bilirubin Urine: NEGATIVE
Glucose, UA: NEGATIVE mg/dL
Hgb urine dipstick: NEGATIVE
Ketones, ur: NEGATIVE mg/dL
Nitrite: POSITIVE — AB
Protein, ur: NEGATIVE mg/dL
Specific Gravity, Urine: 1.018 (ref 1.005–1.030)
pH: 5 (ref 5.0–8.0)

## 2019-04-16 LAB — COMPREHENSIVE METABOLIC PANEL
ALT: 12 U/L (ref 0–44)
AST: 17 U/L (ref 15–41)
Albumin: 3.9 g/dL (ref 3.5–5.0)
Alkaline Phosphatase: 57 U/L (ref 38–126)
Anion gap: 10 (ref 5–15)
BUN: 14 mg/dL (ref 8–23)
CO2: 26 mmol/L (ref 22–32)
Calcium: 8.7 mg/dL — ABNORMAL LOW (ref 8.9–10.3)
Chloride: 105 mmol/L (ref 98–111)
Creatinine, Ser: 0.56 mg/dL (ref 0.44–1.00)
GFR calc Af Amer: >60 mL/min (ref >60)
GFR calc non Af Amer: >60 mL/min (ref >60)
Glucose, Bld: 155 mg/dL — ABNORMAL HIGH (ref 70–99)
Potassium: 3.9 mmol/L (ref 3.5–5.1)
Sodium: 141 mmol/L (ref 135–145)
Total Bilirubin: 0.7 mg/dL (ref 0.3–1.2)
Total Protein: 6.6 g/dL (ref 6.5–8.1)

## 2019-04-16 LAB — CBC
HCT: 45.7 % (ref 36.0–46.0)
Hemoglobin: 13.9 g/dL (ref 12.0–15.0)
MCH: 26.5 pg (ref 26.0–34.0)
MCHC: 30.4 g/dL (ref 30.0–36.0)
MCV: 87.2 fL (ref 80.0–100.0)
Platelets: 114 10*3/uL — ABNORMAL LOW (ref 150–400)
RBC: 5.24 MIL/uL — ABNORMAL HIGH (ref 3.87–5.11)
RDW: 15.9 % — ABNORMAL HIGH (ref 11.5–15.5)
WBC: 4.7 10*3/uL (ref 4.0–10.5)
nRBC: 0 % (ref 0.0–0.2)

## 2019-04-16 LAB — SARS CORONAVIRUS 2 BY RT PCR (HOSPITAL ORDER, PERFORMED IN ~~LOC~~ HOSPITAL LAB): SARS Coronavirus 2: NEGATIVE

## 2019-04-16 MED ORDER — ALBUTEROL SULFATE (2.5 MG/3ML) 0.083% IN NEBU: 2.5000 mg | INHALATION_SOLUTION | Freq: Four times a day (QID) | RESPIRATORY_TRACT | Status: DC | PRN

## 2019-04-16 MED ORDER — GABAPENTIN 300 MG PO CAPS
300.0000 mg | ORAL_CAPSULE | Freq: Three times a day (TID) | ORAL | Status: DC
  Administered 2019-04-16 – 2019-04-20 (×11): 300 mg via ORAL
  Filled 2019-04-16 (×12): qty 1
  Filled 2019-04-16 (×2): qty 3

## 2019-04-16 MED ORDER — FUROSEMIDE 20 MG PO TABS
20.0000 mg | ORAL_TABLET | Freq: Two times a day (BID) | ORAL | Status: DC
  Administered 2019-04-17 – 2019-04-20 (×7): 20 mg via ORAL
  Filled 2019-04-16 (×9): qty 1

## 2019-04-16 MED ORDER — METOPROLOL TARTRATE 25 MG PO TABS
25.0000 mg | ORAL_TABLET | Freq: Two times a day (BID) | ORAL | Status: DC
  Administered 2019-04-16 – 2019-04-20 (×7): 25 mg via ORAL
  Filled 2019-04-16 (×9): qty 1

## 2019-04-16 MED ORDER — PANTOPRAZOLE SODIUM 40 MG PO TBEC
40.0000 mg | DELAYED_RELEASE_TABLET | Freq: Every day | ORAL | Status: DC
  Administered 2019-04-17 – 2019-04-20 (×4): 40 mg via ORAL
  Filled 2019-04-16 (×4): qty 1

## 2019-04-16 MED ORDER — ACETAMINOPHEN 325 MG PO TABS
650.0000 mg | ORAL_TABLET | Freq: Four times a day (QID) | ORAL | Status: DC | PRN
  Administered 2019-04-18: 650 mg via ORAL
  Filled 2019-04-16: qty 2

## 2019-04-16 MED ORDER — AMIODARONE HCL 200 MG PO TABS
100.0000 mg | ORAL_TABLET | Freq: Every day | ORAL | Status: DC
  Administered 2019-04-17 – 2019-04-20 (×4): 100 mg via ORAL
  Filled 2019-04-16 (×5): qty 1

## 2019-04-16 MED ORDER — FERROUS SULFATE 325 (65 FE) MG PO TABS
325.0000 mg | ORAL_TABLET | Freq: Every day | ORAL | Status: DC
  Filled 2019-04-16 (×5): qty 1

## 2019-04-16 MED ORDER — SPIRONOLACTONE 25 MG PO TABS
12.5000 mg | ORAL_TABLET | Freq: Every day | ORAL | Status: DC
  Administered 2019-04-17 – 2019-04-20 (×4): 12.5 mg via ORAL
  Filled 2019-04-16 (×5): qty 0.5
  Filled 2019-04-16: qty 1

## 2019-04-16 MED ORDER — ALBUTEROL SULFATE HFA 108 (90 BASE) MCG/ACT IN AERS: 2.0000 | INHALATION_SPRAY | Freq: Four times a day (QID) | RESPIRATORY_TRACT | Status: DC | PRN

## 2019-04-16 MED ORDER — FOSFOMYCIN TROMETHAMINE 3 G PO PACK
3.0000 g | PACK | Freq: Once | ORAL | Status: AC
  Administered 2019-04-16: 3 g via ORAL
  Filled 2019-04-16: qty 3

## 2019-04-16 NOTE — ED Triage Notes (Signed)
Pt sent from dr sparks office for placement. Pt is alert and oriented. Lives on own. Chronic O2. Pt admitted recently and reports she was in the bed the whole time and so now is stiff and having difficulty moving around so needs a rehab.  Pt hasn't been able to walk independently since discharged.  No new complaints.  No pain. No fever or sx of infection.

## 2019-04-16 NOTE — ED Notes (Signed)
Covid Swab hand walked to lab.

## 2019-04-16 NOTE — ED Notes (Signed)
Pt back from triage via wheelchair, needed 2 person assist to transfer to the stretcher. Pt A&Ox4 and in NAD.

## 2019-04-16 NOTE — ED Notes (Signed)
Report given to Noel, RN 

## 2019-04-16 NOTE — ED Notes (Addendum)
Pt states that her legs have been giving out on her and she is unable to walk like she used to. Visible discoloration on pts legs bilaterally from the knees down. Cap refill >3secs, on both feet. +1 pulses noted to both feet as well. Pt not c/o of any pain. AxOx4.

## 2019-04-16 NOTE — ED Notes (Signed)
Family member called to check on patient (unsure of name), call was lost due to poor service

## 2019-04-16 NOTE — ED Provider Notes (Signed)
[facility] Emergency Department Provider Note  Time seen: 7:06 PM  I have reviewed the triage vital signs and the nursing notes.   HISTORY  Chief Complaint needs placement    HPI Jessica Blackwell is a 85 y.o. female with a past medical history of arthritis, CHF, gastric reflux, hypertension, presents to the emergency department from her PCP for generalized fatigue/weakness requiring placement.   Per record review patient was admitted to the hospital 04/06/2019 discharge 04/10/2019 after an admission for acute on chronic respiratory failure/hypoxia with a UTI.  During the patient stay she became deconditioned, she was going to be placed in a rehab but the patient really wanted to go home.  Patient went home she lives alone but her daughter has been staying with her trying to help her.  Patient went to her PCP appointment today, has been so weak has not been able to get up out of bed without significant assistance, has not been able to walk around or perform her daily activities since her discharge.  This is a big change from her baseline.  Patient denies any recent fever cough.  Denies any shortness of breath.  Largely negative review of systems.  Past Medical History:  Diagnosis Date  . Aortic stenosis   . Arthritis   . CHF (congestive heart failure) ([place])   . Fall at home 07/28/2015  . GERD (gastroesophageal reflux disease)   . H/O bladder infections   . Hypertension   . Neuropathy    lower extrmities    Patient Active Problem List   Diagnosis Date Noted  . UTI (urinary tract infection) 04/06/2019  . CHF (congestive heart failure) ([place]) 03/26/2019  . Non-intractable vomiting with nausea   . LOC (loss of consciousness) ([place]) 12/22/2016  . AKI (acute kidney injury) ([place]) 08/01/2015  . V-tach ([place]) 08/01/2015  . NSVT (nonsustained ventricular tachycardia) ([place])   . Persistent atrial fibrillation   . Acute systolic congestive heart failure ([place]) 07/31/2015  .  Acute respiratory failure with hypoxia ([place]) 07/28/2015  . Fall at home 07/28/2015  . GERD (gastroesophageal reflux disease) 07/28/2015  . HTN (hypertension) 07/28/2015  . Anemia 07/28/2015    Past Surgical History:  Procedure Laterality Date  . ABDOMINAL HYSTERECTOMY    . CATARACT EXTRACTION    . COLON SURGERY    . ESOPHAGOGASTRODUODENOSCOPY N/A 05/22/2018   Procedure: ESOPHAGOGASTRODUODENOSCOPY (EGD);  Surgeon: Virgel Manifold, MD;  Location: [facility] ENDOSCOPY;  Service: Endoscopy;  Laterality: N/A;  . EYE SURGERY    . FOOT SURGERY Right   . ROTATOR CUFF REPAIR Left   . TONSILLECTOMY      Prior to Admission medications   Medication Sig Start Date End Date Taking? Authorizing Provider  acetaminophen (TYLENOL) 325 MG tablet Take 2 tablets (650 mg total) by mouth every 6 (six) hours as needed for mild pain (or Fever >/= 101). 05/26/18  Yes Gouru, Aruna, MD  albuterol (VENTOLIN HFA) 108 (90 Base) MCG/ACT inhaler Inhale 2 puffs into the lungs every 6 (six) hours as needed for wheezing or shortness of breath. 03/28/19  Yes Gouru, Illene Silver, MD  amiodarone (PACERONE) 200 MG tablet Take 0.5 tablets (100 mg total) by mouth daily. 04/09/19  Yes Wieting, Richard, MD  aspirin EC 81 MG tablet Take 81 mg by mouth daily.   Yes [provider]  cetirizine (ZYRTEC) 10 MG tablet Take 10 mg by mouth daily.   Yes [provider]  ferrous sulfate 325 (65 FE) MG tablet Take  325 mg by mouth daily.   Yes [provider]  furosemide (LASIX) 20 MG tablet Take 20 mg by mouth 2 (two) times daily.   Yes [provider]  gabapentin (NEURONTIN) 300 MG capsule Take 300 mg by mouth 3 (three) times daily.   Yes [provider]  meloxicam (MOBIC) 7.5 MG tablet Take 1 tablet (7.5 mg total) by mouth daily as needed (pain). 04/09/19  Yes Wieting, Richard, MD  metoprolol tartrate (LOPRESSOR) 25 MG tablet Take 1 tablet (25 mg total) by mouth 2 (two) times daily. 05/26/18  Yes Gouru,  Illene Silver, MD  pantoprazole (PROTONIX) 40 MG tablet Take 1 tablet (40 mg total) by mouth daily. 04/09/19  Yes Wieting, Richard, MD  polyethylene glycol (MIRALAX / GLYCOLAX) 17 g packet Take 17 g by mouth daily as needed for severe constipation. 04/09/19  Yes Wieting, Richard, MD  spironolactone (ALDACTONE) 25 MG tablet Take 12.5 mg by mouth daily.   Yes [provider]    Allergies  Allergen Reactions  . Ciprofloxacin Itching and Rash  . Codeine Other (See Comments)    Upset stomach  . Sulfamethoxazole-Trimethoprim Rash    Family History  Problem Relation Age of Onset  . Hypertension Father   . CAD Mother     Social History Social History   Tobacco Use  . Smoking status: Never Smoker  . Smokeless tobacco: Never Used  Substance Use Topics  . Alcohol use: No  . Drug use: No    Review of Systems Constitutional: Negative for fever. Cardiovascular: Negative for chest pain. Respiratory: Negative for shortness of breath. Gastrointestinal: Negative for abdominal pain, vomiting and diarrhea. Genitourinary: Negative for urinary compaints Musculoskeletal: Negative for musculoskeletal complaints Skin: Negative for skin complaints  Neurological: Negative for headache All other ROS negative  ____________________________________________   PHYSICAL EXAM:  VITAL SIGNS: ED Triage Vitals  Enc Vitals Group     BP 04/16/19 1601 102/65     Pulse Rate 04/16/19 1601 64     Resp 04/16/19 1601 18     Temp 04/16/19 1601 98.9 F (37.2 C)     Temp Source 04/16/19 1601 Oral     SpO2 04/16/19 1601 98 %     Weight 04/16/19 1558 148 lb 13 oz (67.5 kg)     Height 04/16/19 1558 5\' 1"  (1.549 m)     Head Circumference --      Peak Flow --      Pain Score 04/16/19 1557 0     Pain Loc --      Pain Edu? --      Excl. in [place]? --    Constitutional: Alert and oriented. Well appearing and in no distress. Eyes: Normal exam ENT      Head: Normocephalic and atraumatic.      Mouth/Throat:  Mucous membranes are moist. Cardiovascular: Normal rate, regular rhythm. No murmur Respiratory: Normal respiratory effort without tachypnea nor retractions. Breath sounds are clear  Gastrointestinal: Soft and nontender. No distention.  Musculoskeletal: Nontender with normal range of motion in all extremities.  Neurologic:  Normal speech and language. No gross focal neurologic deficits  Skin:  Skin is warm, dry and intact.  Psychiatric: Mood and affect are normal.     INITIAL IMPRESSION / ASSESSMENT AND PLAN / ED COURSE  Pertinent labs & imaging results that were available during my care of the patient were reviewed by me and considered in my medical decision making (see chart for details).   Patient presents emergency  department for generalized weakness from her PCP for rehab placement.  Overall the patient appears well, largely negative review of systems.  We will check labs, including coronavirus swab as a precaution.  We will continue to closely monitor.  We will have social work and PT evaluate the patient.  We will attempt to obtain a hospital bed for the patient tonight.  Patient's labs show a mild urinary tract infection we will dose a one-time dose of fosfomycin and send a urine culture.  Other lab work is largely [place].  Corona test pending.  Jessica Blackwell was evaluated in Emergency Department on 04/16/2019 for the symptoms described in the history of present illness. She was evaluated in the context of the global COVID-19 pandemic, which necessitated consideration that the patient might be at risk for infection with the SARS-CoV-2 virus that causes COVID-19. Institutional protocols and algorithms that pertain to the evaluation of patients at risk for COVID-19 are in a state of rapid change based on information released by regulatory bodies including the CDC and federal and state organizations. These policies and algorithms were followed during the patient's care in the  ED.  ____________________________________________   FINAL CLINICAL IMPRESSION(S) / ED DIAGNOSES  Generalized weakness   Harvest Dark, MD 04/16/19 1910

## 2019-04-16 NOTE — ED Notes (Signed)
Pt given a meal tray and beverage with MD permission. Pt stable in NAD, VS WNL

## 2019-04-17 DIAGNOSIS — N390 Urinary tract infection, site not specified: Secondary | ICD-10-CM | POA: Diagnosis not present

## 2019-04-17 NOTE — ED Notes (Signed)
Pt given dinner tray with 4 juices at pt's request.

## 2019-04-17 NOTE — ED Notes (Signed)
Report received, care assumed.

## 2019-04-17 NOTE — ED Notes (Signed)
Pt requesting lights to be lowered for sleeping

## 2019-04-17 NOTE — NC FL2 (Signed)
[place] LEVEL OF CARE SCREENING TOOL     IDENTIFICATION  Patient Name: Jessica Blackwell Birthdate: Feb 25, 1925 Sex: female Admission Date (Current Location): 04/16/2019  [place] and [place] Number:  Engineering geologist and Address:  [facility], [address] [place], [place] [address]      Provider Number: [id number]  Attending Physician Name and Address:  No att. providers found  Relative Name and Phone Number:  Burman Foster, granddaughter, [phone]    Current Level of Care: Hospital Recommended Level of Care: [place] Prior Approval Number:    Date Approved/Denied:   PASRR Number: [id number]  Discharge Plan: SNF    Current Diagnoses: Patient Active Problem List   Diagnosis Date Noted  . UTI (urinary tract infection) 04/06/2019  . CHF (congestive heart failure) ([place]) 03/26/2019  . Non-intractable vomiting with nausea   . LOC (loss of consciousness) ([place]) 12/22/2016  . AKI (acute kidney injury) ([place]) 08/01/2015  . V-tach ([place]) 08/01/2015  . NSVT (nonsustained ventricular tachycardia) ([place])   . Persistent atrial fibrillation   . Acute systolic congestive heart failure ([place]) 07/31/2015  . Acute respiratory failure with hypoxia ([place]) 07/28/2015  . Fall at home 07/28/2015  . GERD (gastroesophageal reflux disease) 07/28/2015  . HTN (hypertension) 07/28/2015  . Anemia 07/28/2015    Orientation RESPIRATION BLADDER Height & Weight     Self, Time, Situation, Place  O2(o2 litter acute o2) Indwelling catheter Weight: 148 lb 13 oz (67.5 kg) Height:  5\' 1"  (154.9 cm)  BEHAVIORAL SYMPTOMS/MOOD NEUROLOGICAL BOWEL NUTRITION STATUS      Continent Diet  AMBULATORY STATUS COMMUNICATION OF NEEDS Skin   Extensive Assist Verbally Normal                       Personal Care Assistance Level of Assistance  Bathing, Feeding, Dressing Bathing Assistance: Maximum assistance Feeding assistance: Limited  assistance Dressing Assistance: Maximum assistance Total Care Assistance: Maximum assistance   Functional Limitations Info    Sight Info: Adequate Hearing Info: Adequate Speech Info: Adequate    SPECIAL CARE FACTORS FREQUENCY  PT (By licensed PT), OT (By licensed OT)     PT Frequency: 5x OT Frequency: 5x            Contractures Contractures Info: Not present    Additional Factors Info  Code Status, Allergies Code Status Info: DNR Allergies Info: Ciprofloxacin, Codeine, Sulfamethoxazole-trimethoprim           Current Medications (04/17/2019):  This is the current hospital active medication list Current Facility-Administered Medications  Medication Dose Route Frequency Provider Last Rate Last Dose  . acetaminophen (TYLENOL) tablet 650 mg  650 mg Oral Q6H PRN Harvest Dark, MD      . albuterol (PROVENTIL) (2.5 MG/3ML) 0.083% nebulizer solution 2.5 mg  2.5 mg Nebulization Q6H PRN Nazari, Walid A, RPH      . amiodarone (PACERONE) tablet 100 mg  100 mg Oral Daily Harvest Dark, MD   100 mg at 04/17/19 0946  . ferrous sulfate tablet 325 mg  325 mg Oral Daily Harvest Dark, MD      . furosemide (LASIX) tablet 20 mg  20 mg Oral BID Harvest Dark, MD   20 mg at 04/17/19 0817  . gabapentin (NEURONTIN) capsule 300 mg  300 mg Oral TID Harvest Dark, MD   300 mg at 04/17/19 0816  . metoprolol tartrate (LOPRESSOR) tablet 25 mg  25 mg Oral BID Harvest Dark, MD  25 mg at 04/17/19 0817  . pantoprazole (PROTONIX) EC tablet 40 mg  40 mg Oral Daily Harvest Dark, MD   40 mg at 04/17/19 7915  . spironolactone (ALDACTONE) tablet 12.5 mg  12.5 mg Oral Daily Harvest Dark, MD   12.5 mg at 04/17/19 0569   Current Outpatient Medications  Medication Sig Dispense Refill  . acetaminophen (TYLENOL) 325 MG tablet Take 2 tablets (650 mg total) by mouth every 6 (six) hours as needed for mild pain (or Fever >/= 101).    Marland Kitchen albuterol (VENTOLIN HFA) 108 (90 Base)  MCG/ACT inhaler Inhale 2 puffs into the lungs every 6 (six) hours as needed for wheezing or shortness of breath. 8 g 0  . amiodarone (PACERONE) 200 MG tablet Take 0.5 tablets (100 mg total) by mouth daily.    Marland Kitchen aspirin EC 81 MG tablet Take 81 mg by mouth daily.    . cetirizine (ZYRTEC) 10 MG tablet Take 10 mg by mouth daily.    . ferrous sulfate 325 (65 FE) MG tablet Take 325 mg by mouth daily.    . furosemide (LASIX) 20 MG tablet Take 20 mg by mouth 2 (two) times daily.    Marland Kitchen gabapentin (NEURONTIN) 300 MG capsule Take 300 mg by mouth 3 (three) times daily.    . meloxicam (MOBIC) 7.5 MG tablet Take 1 tablet (7.5 mg total) by mouth daily as needed (pain).    . metoprolol tartrate (LOPRESSOR) 25 MG tablet Take 1 tablet (25 mg total) by mouth 2 (two) times daily.    . pantoprazole (PROTONIX) 40 MG tablet Take 1 tablet (40 mg total) by mouth daily.    . polyethylene glycol (MIRALAX / GLYCOLAX) 17 g packet Take 17 g by mouth daily as needed for severe constipation. 14 each 0  . spironolactone (ALDACTONE) 25 MG tablet Take 12.5 mg by mouth daily.       Discharge Medications: Please see discharge summary for a list of discharge medications.  Relevant Imaging Results:  Relevant Lab Results:   Additional Information SS No.: [id number]  Berenice Bouton, LCSW

## 2019-04-17 NOTE — ED Notes (Addendum)
Left message with PT re: PT referral.

## 2019-04-17 NOTE — ED Notes (Signed)
Attempted to get pt OOB to chair with 2 person assist, pt unable to stand or transfer with 2 person assist. Awaiting PT consult.

## 2019-04-17 NOTE — ED Notes (Signed)
Pt eating lunch

## 2019-04-17 NOTE — ED Notes (Signed)
Pt ate most of her lunch tray with 2 orange juices and has 2 grape juices at bedside.

## 2019-04-17 NOTE — TOC Initial Note (Signed)
Transition of Care [facility]) - Initial/Assessment Note    Patient Details  Name: Jessica Blackwell MRN: [id number] Date of Birth: [birth year]  Transition of Care [facility]) CM/SW Contact:    Berenice Bouton, LCSW Phone Number: 04/17/2019, 12:18 PM  Clinical Narrative:                The patient is a 85 y.o., woman who presented to [facility] ED due to "difficulty moving."  Family requesting skills nursing placement.     Expected Discharge Plan: Skilled Nursing Facility Barriers to Discharge: SNF Pending bed offer   Patient Goals and CMS Choice Patient states their goals for this hospitalization and ongoing recovery are:: Regain strength CMS Medicare.gov Compare Post Acute Care list provided to:: Patient Represenative (must comment) Choice offered to / list presented to : Adult Children(Granddaugher, daughter)  Expected Discharge Plan and Services Expected Discharge Plan: [place] In-house Referral: Clinical Social Work   Post Acute Care Choice: [place] Living arrangements for the past 2 months: [place]                                      Prior Living Arrangements/Services Living arrangements for the past 2 months: Single Family Home Lives with:: Self Patient language and need for interpreter reviewed:: No        Need for Family Participation in Patient Care: Yes (Comment) Care giver support system in place?: Yes (comment) Current home services: Home PT, DME, Homehealth aide, Home OT Criminal Activity/Legal Involvement Pertinent to Current Situation/Hospitalization: No - Comment as needed  Activities of Daily Living      Permission Sought/Granted Permission sought to share information with : Case Manager, Customer service manager, Family Supports Permission granted to share information with : Yes, Verbal Permission Granted  Share Information with NAME: Winifred [place], daughter [phone]; Burman Foster  [phone]  Permission granted to share info w AGENCY: [place] granted to share info w Relationship: Daughter, grand daughter     Emotional Assessment Appearance:: Appears stated age Attitude/Demeanor/Rapport: Other (comment)(Calm) Affect (typically observed): Accepting Orientation: : Oriented to Self, Oriented to Place, Oriented to  Time, Oriented to Situation Alcohol / Substance Use: Not Applicable Psych Involvement: No (comment)  Admission diagnosis:  needs placement Patient Active Problem List   Diagnosis Date Noted  . UTI (urinary tract infection) 04/06/2019  . CHF (congestive heart failure) ([place]) 03/26/2019  . Non-intractable vomiting with nausea   . LOC (loss of consciousness) ([place]) 12/22/2016  . AKI (acute kidney injury) ([place]) 08/01/2015  . V-tach ([place]) 08/01/2015  . NSVT (nonsustained ventricular tachycardia) ([place])   . Persistent atrial fibrillation   . Acute systolic congestive heart failure ([place]) 07/31/2015  . Acute respiratory failure with hypoxia ([place]) 07/28/2015  . Fall at home 07/28/2015  . GERD (gastroesophageal reflux disease) 07/28/2015  . HTN (hypertension) 07/28/2015  . Anemia 07/28/2015   PCP:  Idelle Crouch, MD Pharmacy:   [facility] [place] [address] Phone: [phone] Fax: [phone]     Social Determinants of Health (SDOH) Interventions    Readmission Risk Interventions Readmission Risk Prevention Plan 04/10/2019 04/09/2019  Post Dischage Appt Complete Complete  Medication Screening Complete Complete  Transportation Screening Complete Complete  Some recent data might be hidden

## 2019-04-17 NOTE — Evaluation (Signed)
Physical Therapy Evaluation Patient Details Name: Jessica Blackwell MRN: [id number] DOB: [birth year] Today's Date: 04/17/2019   History of Present Illness  Jessica Blackwell is a 48yoF who comes to [facility] after continued weakness and inability to walk at home. Pt was admitted to [facility] twice in July, DC last week to home against recommendations of PT for DC to rehab. Pt was unable to ambulate during admission d/t gross weakness. PMH: CHF, GERD, HTN. Pt reports she has had many falls in the past and get easily anxious when attempting to mobilize. Pt is familiar to author from prior admissions.  Clinical Impression  Pt admitted with above diagnosis. Pt currently with functional limitations due to the deficits listed below (see "PT Problem List"). Upon entry, pt in bed, awake and agreeable to participate. But hesitant given her gross weakness and repeated labored efforts of this morning with nursing. Pt just finishing gown and linen change with NA after bowel incontinence. The pt is alert and oriented x4, pleasant, conversational, and generally a good historian. Pt reports she regrets not going to rehab when recommended last admission, and has not been able to AMB more than 3 steps all week, in spite of help from family and HHPT services. This date Mod-maxA for bed mobility, modA for transfer to standing and maxA for correction of several LOB. Pt struggles with standing in place once up, very limited tolerance. Mentation remains good as in prior encounters. Functional mobility assessment demonstrates increased effort/time requirements, poor tolerance, and need for physical assistance, whereas the patient performed these at a higher level of independence PTA. Pt is typically able to AMB household distances with 4WW, but has been grossly deconditioned for several weeks now. A STR stay would offer the level of assistance she needs with ADL, and the frequency of rehab needed for quick recovery. Pt will benefit from skilled PT  intervention to increase independence and safety with basic mobility in preparation for discharge to the venue listed below.       Follow Up Recommendations SNF    Equipment Recommendations  None recommended by PT    Recommendations for Other Services       Precautions / Restrictions Precautions Precautions: Fall Precaution Comments: Chronic O2 at home Restrictions Weight Bearing Restrictions: No      Mobility  Bed Mobility Overal bed mobility: Needs Assistance Bed Mobility: Supine to Sit;Sit to Supine;Rolling Rolling: Min assist   Supine to sit: Mod assist Sit to supine: Min assist   General bed mobility comments: maxA for scooting up in bed  Transfers Overall transfer level: Needs assistance Equipment used: Rolling walker (2 wheeled) Transfers: Sit to/from Stand Sit to Stand: Mod assist         General transfer comment: verbalizes technique learned at home with PT, but struggles with implementation of plan. requires maxA for maintaining balance after 3-4x LOB feet sliding forward;pt unable to obtain independent balance.  Ambulation/Gait Ambulation/Gait assistance: (Deferred: pt weak and unsteady, anxious and knees buckling.)              Stairs            Wheelchair Mobility    Modified Rankin (Stroke Patients Only)       Balance Overall balance assessment: Needs assistance Sitting-balance support: Single extremity supported;Feet supported;Feet unsupported Sitting balance-Leahy Scale: Fair     Standing balance support: Bilateral upper extremity supported;During functional activity Standing balance-Leahy Scale: Zero  Pertinent Vitals/Pain Pain Assessment: No/denies pain    Home Living Family/patient expects to be discharged to:: Private residence Living Arrangements: Alone Available Help at Discharge: Family;Available PRN/intermittently(caregiver 10-5 to help with house work) Type of Home:  House Home Access: Stairs to enter Entrance Stairs-Rails: Left Entrance Stairs-Number of Steps: 3 Home Layout: One level Home Equipment: [place] - 2 wheels;Walker - 4 wheels;Wheelchair - manual;Shower seat;Hand held shower head;Adaptive equipment      Prior Function Level of Independence: Needs assistance   Gait / Transfers Assistance Needed: pt reports she uses rollater in house and ambulates very limited distances outside of the home           Hand Dominance        Extremity/Trunk Assessment   Upper Extremity Assessment Upper Extremity Assessment: Generalized weakness    Lower Extremity Assessment Lower Extremity Assessment: Generalized weakness       Communication      Cognition Arousal/Alertness: Awake/alert Behavior During Therapy: WFL for tasks assessed/performed Overall Cognitive Status: Within Functional Limits for tasks assessed                                        General Comments      Exercises     Assessment/Plan    PT Assessment Patient needs continued PT services  PT Problem List Decreased strength;Decreased range of motion;Decreased activity tolerance;Decreased balance;Decreased mobility;Decreased coordination       PT Treatment Interventions DME instruction;Gait training;Functional mobility training;Therapeutic activities;Therapeutic exercise;Stair training;Balance training;Patient/family education    PT Goals (Current goals can be found in the Care Plan section)  Acute Rehab PT Goals Patient Stated Goal: become stronger, return to AMB PT Goal Formulation: With patient Time For Goal Achievement: 04/21/19 Potential to Achieve Goals: Good    Frequency Min 2X/week   Barriers to discharge Inaccessible home environment;Decreased caregiver support      Co-evaluation               AM-PAC PT "6 Clicks" Mobility  Outcome Measure Help needed turning from your back to your side while in a flat bed without using  bedrails?: A Lot Help needed moving from lying on your back to sitting on the side of a flat bed without using bedrails?: A Lot Help needed moving to and from a bed to a chair (including a wheelchair)?: Total Help needed standing up from a chair using your arms (e.g., wheelchair or bedside chair)?: Total Help needed to walk in hospital room?: Total Help needed climbing 3-5 steps with a railing? : Total 6 Click Score: 8    End of Session Equipment Utilized During Treatment: Oxygen Activity Tolerance: Patient limited by fatigue;No increased pain Patient left: in bed;with call bell/phone within reach;Other (comment)(pt requests all 4 rails up) Nurse Communication: Mobility status PT Visit Diagnosis: Unsteadiness on feet (R26.81);Other abnormalities of gait and mobility (R26.89);Muscle weakness (generalized) (M62.81);Difficulty in walking, not elsewhere classified (R26.2)    Time: 8101-7510 PT Time Calculation (min) (ACUTE ONLY): 14 min   Charges:   PT Evaluation $PT Eval Low Complexity: 1 Low         12:27 PM, 04/17/19 Etta Grandchild, PT, DPT Physical Therapist - [facility]  [phone] ([place])    [place] C 04/17/2019, 12:22 PM

## 2019-04-17 NOTE — Social Work (Signed)
TOC CM/SW consult received.  In progress.    Berenice Bouton, MSW, LCSW  [phone] 8am-6pm (weekends) or CSW ED # [phone]

## 2019-04-17 NOTE — ED Notes (Signed)
Pt placed in hospital bed at this time and adjusted for comfort.

## 2019-04-18 DIAGNOSIS — N390 Urinary tract infection, site not specified: Secondary | ICD-10-CM | POA: Diagnosis not present

## 2019-04-18 MED ORDER — ASPIRIN 81 MG PO CHEW
81.0000 mg | CHEWABLE_TABLET | Freq: Every day | ORAL | Status: DC
  Administered 2019-04-19 – 2019-04-20 (×2): 81 mg via ORAL
  Filled 2019-04-18 (×2): qty 1

## 2019-04-18 NOTE — ED Notes (Signed)
Received call from pt's daughter who states social worked called the granddaughter yesterday afternoon and discussed placement in [place].  Daughter states she has not agreed to her mother going to [place] and wanted more information.  Chart reviewed, no notes from social work.  Gave daughter update on pt's condition and informed her of currently available information on placement.  Informed her that I would put a call in to social work and call her back when I have an answer.  Message left for social work at this time.

## 2019-04-18 NOTE — ED Notes (Signed)
Report received, care of pt assumed.  Pt resting quietly, eyes closed, respirations even and unlabored, VSS.  Will monitor.

## 2019-04-18 NOTE — ED Notes (Signed)
Eyes closed, resp even and unlabored 

## 2019-04-18 NOTE — ED Notes (Signed)
Pt's monitor alarming asystole; pt yelling out for help; says the alarm woke her up; oxygen sensor on finger changed as it was coming loose;

## 2019-04-18 NOTE — ED Notes (Signed)
PT provided lunch tray and assisted with setup.  Pt declines further needs at this time.

## 2019-04-18 NOTE — ED Provider Notes (Signed)
-----------------------------------------   4:46 AM on 04/18/2019 -----------------------------------------   Blood pressure 121/63, pulse 62, temperature 98 F (36.7 C), temperature source Oral, resp. rate 20, height 1.549 m (5\' 1" ), weight 67.5 kg, SpO2 97 %.  The patient is sleeping currently but has been calm and cooperative while awake during my shift.  There have been no acute events since the last update.  Awaiting placement.   Hinda Kehr, MD 04/18/19 [phone]

## 2019-04-18 NOTE — ED Notes (Signed)
Pt requesting tylenol. States "I won't be able to sleep without it".

## 2019-04-18 NOTE — ED Notes (Signed)
Report from sarah, rn.  

## 2019-04-18 NOTE — TOC Progression Note (Signed)
Transition of Care [facility]) - Progression Note    Patient Details  Name: Jessica Blackwell MRN: [id number] Date of Birth: 1924/10/07  Transition of [facility]) CM/SW Octavia, LCSW Phone Number: 04/18/2019, 3:00 PM  Clinical Narrative:   Waiting for SNF placement. SW LM for [employer] at [facility].  Family noted that patient was promised a bed at [facility].    Collateral/Family contacts: SW spoke to the patient's daughter and updated on placement status.  SW spoke to patient's niece updated her on placement status.     Expected Discharge Plan: Skilled Nursing Facility Barriers to Discharge: SNF Pending bed offer  Expected Discharge Plan and Services Expected Discharge Plan: [place] In-house Referral: Clinical Social Work   Post Acute Care Choice: [place] Living arrangements for the past 2 months: Single Family Home                    Social Determinants of Health (SDOH) Interventions    Readmission Risk Interventions Readmission Risk Prevention Plan 04/10/2019 04/09/2019  Post Dischage Appt Complete Complete  Medication Screening Complete Complete  Transportation Screening Complete Complete  Some recent data might be hidden

## 2019-04-18 NOTE — ED Notes (Signed)
Pt requests to get ready for bed. Bedside cleaning done. Pt does not want to brush her teeth. Blankets situated and pt repositioned. Pt lights turned off.

## 2019-04-18 NOTE — ED Notes (Signed)
PT placed on bedpan and cleaned of moderate amount of brown stool.  PT cleaned, brief changed, purwick replaced with new one and pt repositioned in the bed.  PT voices no other c/o or needs at this time.  Will continue to monitor.

## 2019-04-19 ENCOUNTER — Other Ambulatory Visit: Payer: Self-pay | Admitting: Nurse Practitioner

## 2019-04-19 ENCOUNTER — Observation Stay: Payer: Self-pay

## 2019-04-19 ENCOUNTER — Telehealth: Payer: Self-pay | Admitting: Nurse Practitioner

## 2019-04-19 DIAGNOSIS — N390 Urinary tract infection, site not specified: Secondary | ICD-10-CM | POA: Diagnosis not present

## 2019-04-19 LAB — COMPREHENSIVE METABOLIC PANEL
ALT: 15 U/L (ref 0–44)
AST: 17 U/L (ref 15–41)
Albumin: 4.1 g/dL (ref 3.5–5.0)
Alkaline Phosphatase: 68 U/L (ref 38–126)
Anion gap: 10 (ref 5–15)
BUN: 14 mg/dL (ref 8–23)
CO2: 32 mmol/L (ref 22–32)
Calcium: 9.3 mg/dL (ref 8.9–10.3)
Chloride: 99 mmol/L (ref 98–111)
Creatinine, Ser: 0.61 mg/dL (ref 0.44–1.00)
GFR calc Af Amer: >60 mL/min (ref >60)
GFR calc non Af Amer: >60 mL/min (ref >60)
Glucose, Bld: 134 mg/dL — ABNORMAL HIGH (ref 70–99)
Potassium: 4.3 mmol/L (ref 3.5–5.1)
Sodium: 141 mmol/L (ref 135–145)
Total Bilirubin: 0.6 mg/dL (ref 0.3–1.2)
Total Protein: 7 g/dL (ref 6.5–8.1)

## 2019-04-19 LAB — URINALYSIS, COMPLETE (UACMP) WITH MICROSCOPIC
Bilirubin Urine: NEGATIVE
Glucose, UA: NEGATIVE mg/dL
Ketones, ur: NEGATIVE mg/dL
Nitrite: POSITIVE — AB
Protein, ur: NEGATIVE mg/dL
Specific Gravity, Urine: 1.01 (ref 1.005–1.030)
WBC, UA: >50 WBC/hpf — ABNORMAL HIGH (ref 0–5)
pH: 7 (ref 5.0–8.0)

## 2019-04-19 LAB — CBC
HCT: 50.5 % — ABNORMAL HIGH (ref 36.0–46.0)
Hemoglobin: 15 g/dL (ref 12.0–15.0)
MCH: 26.5 pg (ref 26.0–34.0)
MCHC: 29.7 g/dL — ABNORMAL LOW (ref 30.0–36.0)
MCV: 89.4 fL (ref 80.0–100.0)
Platelets: 89 10*3/uL — ABNORMAL LOW (ref 150–400)
RBC: 5.65 MIL/uL — ABNORMAL HIGH (ref 3.87–5.11)
RDW: 15.4 % (ref 11.5–15.5)
WBC: 5.3 10*3/uL (ref 4.0–10.5)
nRBC: 0 % (ref 0.0–0.2)

## 2019-04-19 LAB — URINE CULTURE: Culture: >=100000 — AB

## 2019-04-19 LAB — SARS CORONAVIRUS 2 BY RT PCR (HOSPITAL ORDER, PERFORMED IN ~~LOC~~ HOSPITAL LAB): SARS Coronavirus 2: NEGATIVE

## 2019-04-19 MED ORDER — POLYETHYLENE GLYCOL 3350 17 G PO PACK: 17.0000 g | PACK | Freq: Every day | ORAL | Status: DC | PRN

## 2019-04-19 MED ORDER — ASPIRIN EC 81 MG PO TBEC: 81.0000 mg | DELAYED_RELEASE_TABLET | Freq: Every day | ORAL | Status: DC

## 2019-04-19 MED ORDER — SODIUM CHLORIDE 0.9 % IV SOLN
1.0000 g | Freq: Two times a day (BID) | INTRAVENOUS | Status: DC
  Administered 2019-04-19 (×2): 1 g via INTRAVENOUS
  Filled 2019-04-19 (×6): qty 1

## 2019-04-19 MED ORDER — ACETAMINOPHEN 650 MG RE SUPP: 650.0000 mg | Freq: Four times a day (QID) | RECTAL | Status: DC | PRN

## 2019-04-19 MED ORDER — ACETAMINOPHEN 325 MG PO TABS
650.0000 mg | ORAL_TABLET | Freq: Four times a day (QID) | ORAL | Status: DC | PRN
  Administered 2019-04-19: 23:00:00 650 mg via ORAL
  Filled 2019-04-19: qty 2

## 2019-04-19 MED ORDER — FOSFOMYCIN TROMETHAMINE 3 G PO PACK
3.0000 g | PACK | Freq: Once | ORAL | Status: AC
  Administered 2019-04-19: 3 g via ORAL
  Filled 2019-04-19: qty 3

## 2019-04-19 MED ORDER — HEPARIN SODIUM (PORCINE) 5000 UNIT/ML IJ SOLN
5000.0000 [IU] | Freq: Three times a day (TID) | INTRAMUSCULAR | Status: DC
  Administered 2019-04-19 – 2019-04-20 (×3): 5000 [IU] via SUBCUTANEOUS
  Filled 2019-04-19 (×3): qty 1

## 2019-04-19 MED ORDER — ONDANSETRON HCL 4 MG/2ML IJ SOLN: 4.0000 mg | Freq: Four times a day (QID) | INTRAMUSCULAR | Status: DC | PRN

## 2019-04-19 MED ORDER — ONDANSETRON HCL 4 MG PO TABS: 4.0000 mg | ORAL_TABLET | Freq: Four times a day (QID) | ORAL | Status: DC | PRN

## 2019-04-19 MED ORDER — SODIUM CHLORIDE 0.9 % IV SOLN: 1.0000 g | Freq: Three times a day (TID) | INTRAVENOUS | Status: DC

## 2019-04-19 NOTE — Care Management Obs Status (Signed)
[place] NOTIFICATION   Patient Details  Name: Jessica Blackwell MRN: [id number] Date of Birth: 1925/08/15   Medicare Observation Status Notification Given:  Yes    Marshell Garfinkel, RN 04/19/2019, 4:02 PM

## 2019-04-19 NOTE — NC FL2 (Signed)
[place] LEVEL OF CARE SCREENING TOOL     IDENTIFICATION  Patient Name: Jessica Blackwell Birthdate: [birth year] Sex: female Admission Date (Current Location): 04/16/2019  [place] and [place] Number:  Engineering geologist and Address:  [facility] [address], [place], [place] [address]      Provider Number: [id number]  Attending Physician Name and Address:  Fritzi Mandes, MD  Relative Name and Phone Number:  Burman Foster, granddaughter, [phone]    Current Level of Care: Hospital Recommended Level of Care: [place] Prior Approval Number:    Date Approved/Denied:   PASRR Number: [id number]  Discharge Plan: SNF    Current Diagnoses: Patient Active Problem List   Diagnosis Date Noted  . UTI (urinary tract infection) 04/06/2019  . CHF (congestive heart failure) ([place]) 03/26/2019  . Non-intractable vomiting with nausea   . LOC (loss of consciousness) ([place]) 12/22/2016  . AKI (acute kidney injury) ([place]) 08/01/2015  . V-tach ([place]) 08/01/2015  . NSVT (nonsustained ventricular tachycardia) ([place])   . Persistent atrial fibrillation   . Acute systolic congestive heart failure ([place]) 07/31/2015  . Acute respiratory failure with hypoxia ([place]) 07/28/2015  . Fall at home 07/28/2015  . GERD (gastroesophageal reflux disease) 07/28/2015  . HTN (hypertension) 07/28/2015  . Anemia 07/28/2015    Orientation RESPIRATION BLADDER Height & Weight     Self, Time, Situation, Place  O2(o2 litter acute o2) Indwelling catheter Weight: 67.5 kg Height:  5\' 1"  (154.9 cm)  BEHAVIORAL SYMPTOMS/MOOD NEUROLOGICAL BOWEL NUTRITION STATUS      Continent Diet  AMBULATORY STATUS COMMUNICATION OF NEEDS Skin   Extensive Assist Verbally Normal                       Personal Care Assistance Level of Assistance  Bathing, Feeding, Dressing Bathing Assistance: Maximum assistance Feeding assistance: Limited assistance Dressing Assistance:  Maximum assistance Total Care Assistance: Maximum assistance   Functional Limitations Info    Sight Info: Adequate Hearing Info: Adequate Speech Info: Adequate    SPECIAL CARE FACTORS FREQUENCY  PT (By licensed PT), OT (By licensed OT)     PT Frequency: 5x OT Frequency: 5x            Contractures Contractures Info: Not present    Additional Factors Info  Code Status, Allergies Code Status Info: DNR Allergies Info: Ciprofloxacin, Codeine, Sulfamethoxazole-trimethoprim           Current Medications (04/19/2019):  This is the current hospital active medication list Current Facility-Administered Medications  Medication Dose Route Frequency Provider Last Rate Last Dose  . acetaminophen (TYLENOL) tablet 650 mg  650 mg Oral Q6H PRN Harvest Dark, MD   650 mg at 04/18/19 2250  . albuterol (PROVENTIL) (2.5 MG/3ML) 0.083% nebulizer solution 2.5 mg  2.5 mg Nebulization Q6H PRN Nazari, Walid A, RPH      . amiodarone (PACERONE) tablet 100 mg  100 mg Oral Daily Harvest Dark, MD   100 mg at 04/19/19 0801  . aspirin chewable tablet 81 mg  81 mg Oral Daily Earleen Newport, MD   81 mg at 04/19/19 0804  . ferrous sulfate tablet 325 mg  325 mg Oral Daily Harvest Dark, MD      . furosemide (LASIX) tablet 20 mg  20 mg Oral BID Harvest Dark, MD   20 mg at 04/19/19 0803  . gabapentin (NEURONTIN) capsule 300 mg  300 mg Oral TID Harvest Dark, MD   300  mg at 04/19/19 0803  . meropenem (MERREM) 1 g in sodium chloride 0.9 % 100 mL IVPB  1 g Intravenous Q12H Gregor Hams, MD 200 mL/hr at 04/19/19 1451 1 g at 04/19/19 1451  . metoprolol tartrate (LOPRESSOR) tablet 25 mg  25 mg Oral BID Harvest Dark, MD   25 mg at 04/19/19 0802  . pantoprazole (PROTONIX) EC tablet 40 mg  40 mg Oral Daily Harvest Dark, MD   40 mg at 04/19/19 0803  . spironolactone (ALDACTONE) tablet 12.5 mg  12.5 mg Oral Daily Harvest Dark, MD   12.5 mg at 04/19/19 0801   Current  Outpatient Medications  Medication Sig Dispense Refill  . acetaminophen (TYLENOL) 325 MG tablet Take 2 tablets (650 mg total) by mouth every 6 (six) hours as needed for mild pain (or Fever >/= 101).    Marland Kitchen albuterol (VENTOLIN HFA) 108 (90 Base) MCG/ACT inhaler Inhale 2 puffs into the lungs every 6 (six) hours as needed for wheezing or shortness of breath. 8 g 0  . amiodarone (PACERONE) 200 MG tablet Take 0.5 tablets (100 mg total) by mouth daily.    Marland Kitchen aspirin EC 81 MG tablet Take 81 mg by mouth daily.    . cetirizine (ZYRTEC) 10 MG tablet Take 10 mg by mouth daily.    . ferrous sulfate 325 (65 FE) MG tablet Take 325 mg by mouth daily.    . furosemide (LASIX) 20 MG tablet Take 20 mg by mouth 2 (two) times daily.    Marland Kitchen gabapentin (NEURONTIN) 300 MG capsule Take 300 mg by mouth 3 (three) times daily.    . meloxicam (MOBIC) 7.5 MG tablet Take 1 tablet (7.5 mg total) by mouth daily as needed (pain).    . metoprolol tartrate (LOPRESSOR) 25 MG tablet Take 1 tablet (25 mg total) by mouth 2 (two) times daily.    . pantoprazole (PROTONIX) 40 MG tablet Take 1 tablet (40 mg total) by mouth daily.    . polyethylene glycol (MIRALAX / GLYCOLAX) 17 g packet Take 17 g by mouth daily as needed for severe constipation. 14 each 0  . spironolactone (ALDACTONE) 25 MG tablet Take 12.5 mg by mouth daily.       Discharge Medications: Please see discharge summary for a list of discharge medications.  Relevant Imaging Results:  Relevant Lab Results:   Additional Information SS No.: [id number]  Marshell Garfinkel, RN

## 2019-04-19 NOTE — Progress Notes (Signed)
Family Meeting Note  Advance Directive yes  Today a meeting took place with the grand dter --Jessica Blackwell  Patient has history of aortic stenosis, Jessica Blackwell, hypertension and generalized weakness with history of recurrent bladder infection comes in for UTI which is growing urine culture growing Klebsiella ESBL. She will need IV antibiotics. Code status address and granddaughter tells me patient is a no code DNR.   Time spent 16 minutes   Fritzi Mandes, MD

## 2019-04-19 NOTE — TOC Progression Note (Addendum)
Transition of Care [facility]) - Progression Note    Patient Details  Name: Jessica Blackwell MRN: [id number] Date of Birth: 11/04/1924  Transition of Care [facility]) CM/SW Contact  Marshell Garfinkel, RN Phone Number: 04/19/2019, 9:31 AM  Clinical Narrative:    RNCM contacted daughter [name] [phone]. Per [name], she would like TOC to speak with niece [name] [phone] moving forward as she is out of town preparing her home for anticipated bad weather. [name] did share that they are trying to get patient to [facility] and this failed from home due to new Covid process so they brought patient to ED for placement. Per note on 04/10/19 patient was prepared to return to home on chronic supplemental O2 for CHF. I feel patient can be skilled for medication management/pulmonary ascultation to wean from O2 and mobilization. I have shared this with Magda Paganini at [facility].  At [facility] request, I spoke with patient's care manager Langley Gauss [phone] (with [place]).  At this time, [name] is not interested patient going to [place] for SNF. Patient and family are not interested in long-term placement. RNCM waiting for Magda Paganini to review skilled need. I spoke with [name] niece [phone] to update. Update at 1135A- new Covid swab requested. Magda Paganini with [facility] called this RNCM back however she did not leave message and I was on another call- I did return her call and left message asking her to call me back.  I received a call from Jake Michaelis with [facility] also [phone] requesting new Covid swab however it has not been confirmed with Magda Paganini that [facility] will take patient. Updated Magda Paganini at [place] urine culture; will discuss with EDP. Niece [name] asked that I not discuss finances with patient however patient has capacity; [name] is HCPOA however [name] is out of town and asked that [facility] [employer] discuss plan with [name]- ultimately patient is the Media planner but  allowed [name] to assist with decision to return to home at last discharge. Update at 1342: we now have 3 SNF bed offers and Jennifer/niece is still addiment that patient go to [facility]. I explained to [name] that patient has options for safe discharge- [name] acknowledged.  I explained that the ED is not the best place for patient to be however [name] reminded me that patient unfortunately could not be placed from home due to SNF covid restrictions.  Magda Paganini with [facility] still reviewing for skilled need. Update at 1420: RNCM received bed offer from 4th SNF. Update 1521: EDP would like for patient to be treated for UTI. [name] is at patient's bedside currently. [facility] cannot take patient for PO antibiotics. [facility] can accept patient today however her Medicare is showing inactive and I feel something has been entered incorrectly. RNCM will check with [facility]. Update at 1606: MOON letter explained/delivered to granddaughter [name]. Magda Paganini with [facility] CAN take patient tomorrow.  FL2 updated to include current treatment plan.   Expected Discharge Plan: Skilled Nursing Facility Barriers to Discharge: SNF Pending bed offer  Expected Discharge Plan and Services Expected Discharge Plan: [place] In-house Referral: Clinical Social Work   Post Acute Care Choice: [place] Living arrangements for the past 2 months: Single Family Home                                       Social Determinants of Health (SDOH) Interventions  Readmission Risk Interventions Readmission Risk Prevention Plan 04/10/2019 04/09/2019  Post Dischage Appt Complete Complete  Medication Screening Complete Complete  Transportation Screening Complete Complete  Some recent data might be hidden

## 2019-04-19 NOTE — Progress Notes (Signed)
PHARMACY NOTE:  ANTIMICROBIAL RENAL DOSAGE ADJUSTMENT  Current antimicrobial regimen includes a mismatch between antimicrobial dosage and estimated renal function.  As per policy approved by the Pharmacy & Therapeutics and Medical Executive Committees, the antimicrobial dosage will be adjusted accordingly.  Current antimicrobial dosage:  Meropenem 1 gm IV q8h  Indication: UTI-ESBL  Renal Function:  Estimated Creatinine Clearance: 39.5 mL/min (by C-G formula based on SCr of 0.56 mg/dL).     Antimicrobial dosage has been changed to:  Meropenem 1 gm q12h  Additional comments:   Thank you for allowing pharmacy to be a part of this patient's care.  Ameenah Prosser A, [place] 04/19/2019 1:50 PM

## 2019-04-19 NOTE — ED Notes (Signed)
Pt cleansed of incontinent urine, new purwick placed, suction on. Pt immediately back to sleep.

## 2019-04-19 NOTE — Care Management Obs Status (Deleted)
[place] NOTIFICATION   Patient Details  Name: Jessica Blackwell MRN: [id number] Date of Birth: 1925-04-28   Medicare Observation Status Notification Given:  Yes Patient is on isolation. Explained to granddaughter [name].    Marshell Garfinkel, RN 04/19/2019, 3:50 PM

## 2019-04-19 NOTE — ED Notes (Signed)
Report to [place] and [place], rn.

## 2019-04-19 NOTE — Plan of Care (Signed)
Pt admitted from the ED.  VSS. Denies pain. Contact precautions initiated for ESBL in urine. PICC line placement pending.

## 2019-04-19 NOTE — ED Provider Notes (Signed)
3:20 PM 04/19/19  Patient noted to have UTI with multiple antibiotic resistances, also complicated by patient's antibiotic allergies.  Patient seems to be best served by inpatient admission for IV antibiotics, received initial dose of meropenem and accepted by hospitalist service.  Blood pressure 125/71, pulse 61, temperature 97.6 F (36.4 C), temperature source Oral, resp. rate 16, height 5\' 1"  (1.549 m), weight 67.5 kg, SpO2 98 %.   Blake Divine, MD    Blake Divine, MD 04/19/19 [phone]

## 2019-04-19 NOTE — ED Notes (Signed)
Pt awoken for vital signs, declines change in position. No further urine noted since last entry.

## 2019-04-19 NOTE — H&P (Signed)
[facility] [place] NAME: Jessica Blackwell    MR#:  [id number]  DATE OF BIRTH:  [birth year]  DATE OF ADMISSION:  04/16/2019  PRIMARY CARE PHYSICIAN: Idelle Crouch, MD   REQUESTING/REFERRING PHYSICIAN: Dr. Owens Shark  CHIEF COMPLAINT:   Patient is diagnosed with ESBL Klebsiella UTI. She needs and IV PICC line placed before she goes to rehab. pt has been here in the emergency room for three days.  I met with patient's granddaughter [name] in the ER HISTORY OF PRESENT ILLNESS:  Jessica Blackwell  is a 85 y.o. female with a known history of recurrent UTI, aortic stenosis, Gerd, hypertension, generalized weakness comes to the emergency room three days ago with weakness and evaluation found patient has UTI. Her urine culture that was drawn few days ago showed Klebsiella ESBL requiring IV antibiotic. Patient will get a PICC line place today. He has a bed available on [facility] from what I've told by the care manager and will be able to discharge tomorrow if she remains stable.  Discussed with granddaughter [name] denies any complaints.  PAST MEDICAL HISTORY:   Past Medical History:  Diagnosis Date  . Aortic stenosis   . Arthritis   . CHF (congestive heart failure) ([place])   . Fall at home 07/28/2015  . GERD (gastroesophageal reflux disease)   . H/O bladder infections   . Hypertension   . Neuropathy    lower extrmities    PAST SURGICAL HISTOIRY:   Past Surgical History:  Procedure Laterality Date  . ABDOMINAL HYSTERECTOMY    . CATARACT EXTRACTION    . COLON SURGERY    . ESOPHAGOGASTRODUODENOSCOPY N/A 05/22/2018   Procedure: ESOPHAGOGASTRODUODENOSCOPY (EGD);  Surgeon: Virgel Manifold, MD;  Location: [facility] ENDOSCOPY;  Service: Endoscopy;  Laterality: N/A;  . EYE SURGERY    . FOOT SURGERY Right   . ROTATOR CUFF REPAIR Left   . TONSILLECTOMY      SOCIAL HISTORY:   Social History   Tobacco Use  . Smoking status: Never  Smoker  . Smokeless tobacco: Never Used  Substance Use Topics  . Alcohol use: No    FAMILY HISTORY:   Family History  Problem Relation Age of Onset  . Hypertension Father   . CAD Mother     DRUG ALLERGIES:   Allergies  Allergen Reactions  . Ciprofloxacin Itching and Rash  . Codeine Other (See Comments)    Upset stomach  . Sulfamethoxazole-Trimethoprim Rash    REVIEW OF SYSTEMS:  Review of Systems  Constitutional: Negative for chills, fever and weight loss.  HENT: Negative for ear discharge, ear pain and nosebleeds.   Eyes: Negative for blurred vision, pain and discharge.  Respiratory: Negative for sputum production, shortness of breath, wheezing and stridor.   Cardiovascular: Negative for chest pain, palpitations, orthopnea and PND.  Gastrointestinal: Negative for abdominal pain, diarrhea, nausea and vomiting.  Genitourinary: Negative for frequency and urgency.  Musculoskeletal: Negative for back pain and joint pain.  Neurological: Positive for weakness. Negative for sensory change, speech change and focal weakness.  Psychiatric/Behavioral: Negative for depression and hallucinations. The patient is not nervous/anxious.      MEDICATIONS AT HOME:   Prior to Admission medications   Medication Sig Start Date End Date Taking? Authorizing Provider  acetaminophen (TYLENOL) 325 MG tablet Take 2 tablets (650 mg total) by mouth every 6 (six) hours as needed for mild pain (or Fever >/= 101). 05/26/18  Yes Nicholes Mango, MD  albuterol (VENTOLIN HFA) 108 (90 Base) MCG/ACT inhaler Inhale 2 puffs into the lungs every 6 (six) hours as needed for wheezing or shortness of breath. 03/28/19  Yes Gouru, Illene Silver, MD  amiodarone (PACERONE) 200 MG tablet Take 0.5 tablets (100 mg total) by mouth daily. 04/09/19  Yes Wieting, Richard, MD  aspirin EC 81 MG tablet Take 81 mg by mouth daily.   Yes [provider]  cetirizine (ZYRTEC) 10 MG tablet Take 10 mg by mouth daily.   Yes [provider]  ferrous sulfate 325 (65 FE) MG tablet Take 325 mg by mouth daily.   Yes [provider]  furosemide (LASIX) 20 MG tablet Take 20 mg by mouth 2 (two) times daily.   Yes [provider]  gabapentin (NEURONTIN) 300 MG capsule Take 300 mg by mouth 3 (three) times daily.   Yes [provider]  meloxicam (MOBIC) 7.5 MG tablet Take 1 tablet (7.5 mg total) by mouth daily as needed (pain). 04/09/19  Yes Wieting, Richard, MD  metoprolol tartrate (LOPRESSOR) 25 MG tablet Take 1 tablet (25 mg total) by mouth 2 (two) times daily. 05/26/18  Yes Gouru, Illene Silver, MD  pantoprazole (PROTONIX) 40 MG tablet Take 1 tablet (40 mg total) by mouth daily. 04/09/19  Yes Wieting, Richard, MD  polyethylene glycol (MIRALAX / GLYCOLAX) 17 g packet Take 17 g by mouth daily as needed for severe constipation. 04/09/19  Yes Wieting, Richard, MD  spironolactone (ALDACTONE) 25 MG tablet Take 12.5 mg by mouth daily.   Yes [provider]      VITAL SIGNS:  Blood pressure 125/71, pulse 61, temperature 97.6 F (36.4 C), temperature source Oral, resp. rate 16, height '5\' 1"'$  (1.549 m), weight 67.5 kg, SpO2 98 %.  PHYSICAL EXAMINATION:  GENERAL:  85 y.o.-year-old patient lying in the bed with no acute distress.  EYES: Pupils equal, round, reactive to light and accommodation. No scleral icterus. Extraocular muscles intact.  HEENT: Head atraumatic, normocephalic. Oropharynx and nasopharynx clear.  NECK:  Supple, no jugular venous distention. No thyroid enlargement, no tenderness.  LUNGS: Normal breath sounds bilaterally, no wheezing, rales,rhonchi or crepitation. No use of accessory muscles of respiration.  CARDIOVASCULAR: S1, S2 normal. No murmurs, rubs, or gallops.  ABDOMEN: Soft, nontender, nondistended. Bowel sounds present. No organomegaly or mass.  EXTREMITIES: No pedal edema, cyanosis, or clubbing.  NEUROLOGIC: Cranial nerves II through XII are intact. Muscle strength 5/5 in all  extremities. Sensation intact. Gait not checked. Subjective weakness PSYCHIATRIC: The patient is alert and oriented x 3.  SKIN: No obvious rash, lesion, or ulcer.   LABORATORY PANEL:   CBC Recent Labs  Lab 04/16/19 1602  WBC 4.7  HGB 13.9  HCT 45.7  PLT 114*   ------------------------------------------------------------------------------------------------------------------  Chemistries  Recent Labs  Lab 04/16/19 1602  NA 141  K 3.9  CL 105  CO2 26  GLUCOSE 155*  BUN 14  CREATININE 0.56  CALCIUM 8.7*  AST 17  ALT 12  ALKPHOS 57  BILITOT 0.7   ------------------------------------------------------------------------------------------------------------------  Cardiac Enzymes No results for input(s): TROPONINI in the last 168 hours. ------------------------------------------------------------------------------------------------------------------  RADIOLOGY:  [place] Ekg Site Rite  Result Date: 04/19/2019 If Site Rite image not attached, placement could not be confirmed due to current cardiac rhythm.   EKG:    IMPRESSION AND PLAN:   Jessica Blackwell  is a 85 y.o. female with a known history of recurrent UTI, aortic stenosis, Gerd, hypertension, generalized weakness comes to the emergency room three days  ago with weakness and evaluation found patient has UTI. Her urine culture that was drawn few days ago showed Klebsiella ESBL requiring IV antibiotic.  1. Recurrent UTI--- urine culture positive for Klebsiella ESBL -admit overnight observation -PICC line to be placed today--- discussed with granddaughter agreeable -continue IV meropenem for at least seven days  2. Hypertension continue metoprolol  3. Jerrye Bushy continue PPI  4. Generalized weakness patient will go to rehab for physical therapy and IV antibiotics  She is open to palliative care as outpatient please resume the services at discharge  Discussed with care manager Levada Dy in the ER    All the records are  reviewed and case discussed with ED provider.   CODE STATUS: DNR  TOTAL TIME TAKING CARE OF THIS PATIENT: *45** minutes.    Fritzi Mandes M.D on 04/19/2019 at 3:59 PM  Between 7am to 6pm - Pager - [phone]  After 6pm go to [email or web address] - password [email or web address] [facility]  [facility]  Office  [phone]  CC: Primary care physician; Idelle Crouch, MD

## 2019-04-19 NOTE — Telephone Encounter (Signed)
I attempted to contact Jessica Blackwell for scheduled palliative care initial visit, message left, it appears Jessica Blackwell remains in ED at present time at [facility].

## 2019-04-20 DIAGNOSIS — N390 Urinary tract infection, site not specified: Secondary | ICD-10-CM | POA: Diagnosis not present

## 2019-04-20 MED ORDER — SODIUM CHLORIDE 0.9 % IV SOLN
1.0000 g | INTRAVENOUS | Status: DC
  Administered 2019-04-20: 1000 mg via INTRAVENOUS
  Filled 2019-04-20 (×2): qty 1

## 2019-04-20 MED ORDER — SODIUM CHLORIDE 0.9% FLUSH: 10.0000 mL | INTRAVENOUS | Status: DC | PRN

## 2019-04-20 MED ORDER — RISAQUAD PO CAPS: 1.0000 | ORAL_CAPSULE | Freq: Every day | ORAL | 0 refills | Status: AC

## 2019-04-20 MED ORDER — SODIUM CHLORIDE 0.9% FLUSH
10.0000 mL | Freq: Two times a day (BID) | INTRAVENOUS | Status: DC
  Administered 2019-04-20: 10 mL

## 2019-04-20 MED ORDER — SODIUM CHLORIDE 0.9 % IV SOLN: 1.0000 g | INTRAVENOUS | 0 refills | Status: DC

## 2019-04-20 NOTE — Progress Notes (Signed)
Pt is being d/ced to [facility] - going to Rm 507.  Pt has been having recurrent UTIs. Will receive IV abx for a week and receive PT.  She had PICC line placed this am.  Called report to [place] at [facility], ph# [phone]. Pt will transport via EMS.

## 2019-04-20 NOTE — Discharge Summary (Addendum)
[facility] [place] NAME: Jessica Blackwell    MR#:  [id number]  DATE OF BIRTH:  1925/07/15  DATE OF ADMISSION:  04/16/2019 ADMITTING PHYSICIAN: Fritzi Mandes, MD  DATE OF DISCHARGE: 04/20/2019  PRIMARY CARE PHYSICIAN: Idelle Crouch, MD   ADMISSION DIAGNOSIS:  Weakness [R53.1]  DISCHARGE DIAGNOSIS:  Active Problems:   UTI (urinary tract infection)  SECONDARY DIAGNOSIS:   Past Medical History:  Diagnosis Date  . Aortic stenosis   . Arthritis   . CHF (congestive heart failure) ([place])   . Fall at home 07/28/2015  . GERD (gastroesophageal reflux disease)   . H/O bladder infections   . Hypertension   . Neuropathy    lower extrmities    HOSPITAL COURSE:   1.  ESBL Klebsiella urinary tract infection.  It does have a resistance profile and then the patient is allergic to to oral options that we could have potentially chosen.  Switch antibiotics to IV Invanz 1 g IV daily.  PICC line in place.  IV antibiotics Invanz 1 g IV daily to be given for 1 more week through 11 August.  At that time PICC line can be pulled.  PICC line care as per protocol. 2.  Patient has alternating diarrhea and constipation.  While on antibiotics I will start probiotic. 3.  Chronic systolic congestive heart failure with aortic stenosis.  Continue Lasix, beta-blocker and spironolactone.  No ACE inhibitor or ARB secondary to severe aortic stenosis, this type of medication is contraindicated. 4.  Paroxysmal atrial fibrillation on amiodarone 5.  Neuropathy on gabapentin 6.  Essential hypertension continue usual medications 7.  Thrombocytopenia this is chronic in nature 8.  Abnormal CT scan of the chest back in 2010 showed a soft tissue pleural-based nodule which is also seen on recent chest x-ray 9.  Acute on chronic hypoxic respiratory failure.  Chronic 2 L of oxygen prescribed 10.  To COVID test negative recently 11.  The patient wants to be on a regular diet.  She states  she is 56 and she wants to eat what ever she wants to eat. 12. Palliative care to follow with patient as outpatient  DISCHARGE CONDITIONS:   Satisfactory  CONSULTS OBTAINED:  None  DRUG ALLERGIES:   Allergies  Allergen Reactions  . Ciprofloxacin Itching and Rash  . Codeine Other (See Comments)    Upset stomach  . Sulfamethoxazole-Trimethoprim Rash    DISCHARGE MEDICATIONS:   Allergies as of 04/20/2019      Reactions   Ciprofloxacin Itching, Rash   Codeine Other (See Comments)   Upset stomach   Sulfamethoxazole-trimethoprim Rash      Medication List    STOP taking these medications   meloxicam 7.5 MG tablet Commonly known as: MOBIC     TAKE these medications   acetaminophen 325 MG tablet Commonly known as: TYLENOL Take 2 tablets (650 mg total) by mouth every 6 (six) hours as needed for mild pain (or Fever >/= 101).   acidophilus Caps capsule Take 1 capsule by mouth daily for 9 days. Start taking on: April 21, 2019   albuterol 108 (90 Base) MCG/ACT inhaler Commonly known as: VENTOLIN HFA Inhale 2 puffs into the lungs every 6 (six) hours as needed for wheezing or shortness of breath.   amiodarone 200 MG tablet Commonly known as: PACERONE Take 0.5 tablets (100 mg total) by mouth daily.   aspirin EC 81 MG tablet Take 81 mg by mouth daily.   cetirizine  10 MG tablet Commonly known as: ZYRTEC Take 10 mg by mouth daily.   ertapenem 1,000 mg in sodium chloride 0.9 % 100 mL Inject 1,000 mg into the vein daily. Start taking on: April 21, 2019   ferrous sulfate 325 (65 FE) MG tablet Take 325 mg by mouth daily.   furosemide 20 MG tablet Commonly known as: LASIX Take 20 mg by mouth 2 (two) times daily.   gabapentin 300 MG capsule Commonly known as: NEURONTIN Take 300 mg by mouth 3 (three) times daily.   metoprolol tartrate 25 MG tablet Commonly known as: LOPRESSOR Take 1 tablet (25 mg total) by mouth 2 (two) times daily.   pantoprazole 40 MG  tablet Commonly known as: PROTONIX Take 1 tablet (40 mg total) by mouth daily.   polyethylene glycol 17 g packet Commonly known as: MIRALAX / GLYCOLAX Take 17 g by mouth daily as needed for severe constipation.   spironolactone 25 MG tablet Commonly known as: ALDACTONE Take 12.5 mg by mouth daily.        DISCHARGE INSTRUCTIONS:   Follow-up with Dr. at rehab 1 day  If you experience worsening of your admission symptoms, develop shortness of breath, life threatening emergency, suicidal or homicidal thoughts you must seek medical attention immediately by calling 911 or calling your MD immediately  if symptoms less severe.  You Must read complete instructions/literature along with all the possible adverse reactions/side effects for all the Medicines you take and that have been prescribed to you. Take any new Medicines after you have completely understood and accept all the possible adverse reactions/side effects.   Please note  You were cared for by a hospitalist during your hospital stay. If you have any questions about your discharge medications or the care you received while you were in the hospital after you are discharged, you can call the unit and asked to speak with the hospitalist on call if the hospitalist that took care of you is not available. Once you are discharged, your primary care physician will handle any further medical issues. Please note that NO REFILLS for any discharge medications will be authorized once you are discharged, as it is imperative that you return to your primary care physician (or establish a relationship with a primary care physician if you do not have one) for your aftercare needs so that they can reassess your need for medications and monitor your lab values.    Today   CHIEF COMPLAINT:   Chief Complaint  Patient presents with  . needs placement    HISTORY OF PRESENT ILLNESS:  Jessica Blackwell  is a 85 y.o. female found to have  multidrug-resistant urinary tract infection   VITAL SIGNS:  Blood pressure 130/77, pulse 73, temperature 98 F (36.7 C), temperature source Oral, resp. rate 20, height 5\' 1"  (1.549 m), weight 67.5 kg, SpO2 92 %.    PHYSICAL EXAMINATION:  GENERAL:  85 y.o.-year-old patient lying in the bed with no acute distress.  EYES: Pupils equal, round, reactive to light and accommodation. No scleral icterus. Extraocular muscles intact.  HEENT: Head atraumatic, normocephalic. Oropharynx and nasopharynx clear.  NECK:  Supple, no jugular venous distention. No thyroid enlargement, no tenderness.  LUNGS: Normal breath sounds bilaterally, no wheezing, rales,rhonchi or crepitation. No use of accessory muscles of respiration.  CARDIOVASCULAR: S1, S2 normal. No murmurs, rubs, or gallops.  ABDOMEN: Soft, non-tender, non-distended. Bowel sounds present. No organomegaly or mass.  EXTREMITIES: Trace pedal edema, no cyanosis, or clubbing.  NEUROLOGIC: Cranial  nerves II through XII are intact. Muscle strength 5/5 in all extremities. Sensation intact. Gait not checked.  PSYCHIATRIC: The patient is alert and oriented x 3.  SKIN: No obvious rash, lesion, or ulcer.   DATA REVIEW:   CBC Recent Labs  Lab 04/19/19 1743  WBC 5.3  HGB 15.0  HCT 50.5*  PLT 89*    Chemistries  Recent Labs  Lab 04/19/19 1743  NA 141  K 4.3  CL 99  CO2 32  GLUCOSE 134*  BUN 14  CREATININE 0.61  CALCIUM 9.3  AST 17  ALT 15  ALKPHOS 68  BILITOT 0.6    Microbiology Results  Results for orders placed or performed during the hospital encounter of 04/16/19  SARS Coronavirus 2 [facility] order, Performed in [place] hospital lab)     Status: None   Collection Time: 04/16/19  6:44 PM  Result Value Ref Range Status   SARS Coronavirus 2 NEGATIVE NEGATIVE Final    Comment: (NOTE) If result is NEGATIVE SARS-CoV-2 target nucleic acids are NOT DETECTED. The SARS-CoV-2 RNA is generally detectable in upper and lower   respiratory specimens during the acute phase of infection. The lowest  concentration of SARS-CoV-2 viral copies this assay can detect is 250  copies / mL. A negative result does not preclude SARS-CoV-2 infection  and should not be used as the sole basis for treatment or other  patient management decisions.  A negative result may occur with  improper specimen collection / handling, submission of specimen other  than nasopharyngeal swab, presence of viral mutation(s) within the  areas targeted by this assay, and inadequate number of viral copies  (<250 copies / mL). A negative result must be combined with clinical  observations, patient history, and epidemiological information. If result is POSITIVE SARS-CoV-2 target nucleic acids are DETECTED. The SARS-CoV-2 RNA is generally detectable in upper and lower  respiratory specimens dur ing the acute phase of infection.  Positive  results are indicative of active infection with SARS-CoV-2.  Clinical  correlation with patient history and other diagnostic information is  necessary to determine patient infection status.  Positive results do  not rule out bacterial infection or co-infection with other viruses. If result is PRESUMPTIVE POSTIVE SARS-CoV-2 nucleic acids MAY BE PRESENT.   A presumptive positive result was obtained on the submitted specimen  and confirmed on repeat testing.  While 2019 novel coronavirus  (SARS-CoV-2) nucleic acids may be present in the submitted sample  additional confirmatory testing may be necessary for epidemiological  and / or clinical management purposes  to differentiate between  SARS-CoV-2 and other Sarbecovirus currently known to infect humans.  If clinically indicated additional testing with an alternate test  methodology [phone]) is advised. The SARS-CoV-2 RNA is generally  detectable in upper and lower respiratory sp ecimens during the acute  phase of infection. The expected result is Negative. Fact  Sheet for Patients:  StrictlyIdeas.no Fact Sheet for Healthcare Providers: BankingDealers.co.za This test is not yet approved or cleared by the [place] FDA and has been authorized for detection and/or diagnosis of SARS-CoV-2 by FDA under an Emergency Use Authorization (EUA).  This EUA will remain in effect (meaning this test can be used) for the duration of the COVID-19 declaration under Section 564(b)(1) of the Act, 21 U.S.C. section 360bbb-3(b)(1), unless the authorization is terminated or revoked sooner. Performed at [facility], [address] [place], [place] [address]   Urine Culture     Status: Abnormal  Collection Time: 04/16/19  6:44 PM   Specimen: Urine, Random  Result Value Ref Range Status   Specimen Description   Final    URINE, RANDOM Performed at [facility], [address], [place], [place] [address]    Special Requests   Final    NONE Performed at [facility], [address] [place], [place] [address]    Culture (A)  Final    >=100,000 COLONIES/mL KLEBSIELLA PNEUMONIAE Confirmed Extended Spectrum Beta-Lactamase Producer (ESBL).  In bloodstream infections from ESBL organisms, carbapenems are preferred over piperacillin/tazobactam. They are shown to have a lower risk of mortality.    Report Status 04/19/2019 FINAL  Final   Organism ID, Bacteria KLEBSIELLA PNEUMONIAE (A)  Final      Susceptibility   Klebsiella pneumoniae - MIC*    AMPICILLIN >=32 RESISTANT Resistant     CEFAZOLIN >=64 RESISTANT Resistant     CEFTRIAXONE >=64 RESISTANT Resistant     CIPROFLOXACIN <=0.25 SENSITIVE Sensitive     GENTAMICIN <=1 SENSITIVE Sensitive     IMIPENEM 0.5 SENSITIVE Sensitive     NITROFURANTOIN >=512 RESISTANT Resistant     TRIMETH/SULFA <=20 SENSITIVE Sensitive     AMPICILLIN/SULBACTAM >=32 RESISTANT Resistant     PIP/TAZO >=128 RESISTANT Resistant     Extended ESBL POSITIVE Resistant      * >=100,000 COLONIES/mL KLEBSIELLA PNEUMONIAE  SARS Coronavirus 2 [facility] order, Performed in [facility] hospital lab) Nasopharyngeal Nasopharyngeal Swab     Status: None   Collection Time: 04/19/19 10:38 AM   Specimen: Nasopharyngeal Swab  Result Value Ref Range Status   SARS Coronavirus 2 NEGATIVE NEGATIVE Final    Comment: (NOTE) If result is NEGATIVE SARS-CoV-2 target nucleic acids are NOT DETECTED. The SARS-CoV-2 RNA is generally detectable in upper and lower  respiratory specimens during the acute phase of infection. The lowest  concentration of SARS-CoV-2 viral copies this assay can detect is 250  copies / mL. A negative result does not preclude SARS-CoV-2 infection  and should not be used as the sole basis for treatment or other  patient management decisions.  A negative result may occur with  improper specimen collection / handling, submission of specimen other  than nasopharyngeal swab, presence of viral mutation(s) within the  areas targeted by this assay, and inadequate number of viral copies  (<250 copies / mL). A negative result must be combined with clinical  observations, patient history, and epidemiological information. If result is POSITIVE SARS-CoV-2 target nucleic acids are DETECTED. The SARS-CoV-2 RNA is generally detectable in upper and lower  respiratory specimens dur ing the acute phase of infection.  Positive  results are indicative of active infection with SARS-CoV-2.  Clinical  correlation with patient history and other diagnostic information is  necessary to determine patient infection status.  Positive results do  not rule out bacterial infection or co-infection with other viruses. If result is PRESUMPTIVE POSTIVE SARS-CoV-2 nucleic acids MAY BE PRESENT.   A presumptive positive result was obtained on the submitted specimen  and confirmed on repeat testing.  While 2019 novel coronavirus  (SARS-CoV-2) nucleic acids may be present in the submitted  sample  additional confirmatory testing may be necessary for epidemiological  and / or clinical management purposes  to differentiate between  SARS-CoV-2 and other Sarbecovirus currently known to infect humans.  If clinically indicated additional testing with an alternate test  methodology [phone]) is advised. The SARS-CoV-2 RNA is generally  detectable in upper and lower respiratory sp ecimens  during the acute  phase of infection. The expected result is Negative. Fact Sheet for Patients:  StrictlyIdeas.no Fact Sheet for Healthcare Providers: BankingDealers.co.za This test is not yet approved or cleared by the [place] FDA and has been authorized for detection and/or diagnosis of SARS-CoV-2 by FDA under an Emergency Use Authorization (EUA).  This EUA will remain in effect (meaning this test can be used) for the duration of the COVID-19 declaration under Section 564(b)(1) of the Act, 21 U.S.C. section 360bbb-3(b)(1), unless the authorization is terminated or revoked sooner. Performed at [facility], [address] [place], [place] [address]     RADIOLOGY:  [place] Ekg Site Rite  Result Date: 04/19/2019 If [facility] image not attached, placement could not be confirmed due to current cardiac rhythm.    Management plans discussed with the patient, family and they are in agreement.  CODE STATUS:     Code Status Orders  (From admission, onward)         Start     Ordered   04/19/19 1740  Do not attempt resuscitation (DNR)  Continuous    Question Answer Comment  In the event of cardiac or respiratory ARREST Do not call a "code blue"   In the event of cardiac or respiratory ARREST Do not perform Intubation, CPR, defibrillation or ACLS   In the event of cardiac or respiratory ARREST Use medication by any route, position, wound care, and other measures to relive pain and suffering. May use oxygen, suction and manual  treatment of airway obstruction as needed for comfort.   Comments nurse may pronounce      04/19/19 1739        Code Status History    Date Active Date Inactive Code Status Order ID Comments User Context   04/07/2019 0017 04/10/2019 1951 DNR [id number]  Mayer Camel, NP Inpatient   04/07/2019 0017 04/07/2019 0017 DNR [id number]  Mayer Camel, NP Inpatient   03/26/2019 1948 03/28/2019 1814 DNR [id number]  Loletha Grayer, MD ED   05/21/2018 2003 05/26/2018 2013 Full Code [id number]  Fritzi Mandes, MD Inpatient   05/20/2018 1418 05/21/2018 2003 DNR [id number]  Loletha Grayer, MD ED   12/22/2016 2052 12/23/2016 1512 Full Code [id number]  Quintella Baton, MD Inpatient   07/28/2015 0638 08/02/2015 1844 Full Code [id number]  Norval Morton, MD Inpatient   Advance Care Planning Activity    Advance Directive Documentation     Most Recent Value  Type of Advance Directive  Healthcare Power of Attorney  Pre-existing out of facility DNR order (yellow form or pink MOST form)  -  "MOST" Form in Place?  -      TOTAL TIME TAKING CARE OF THIS PATIENT: 35 minutes.    Loletha Grayer M.D on 04/20/2019 at 10:26 AM  Between 7am to 6pm - Pager - [phone]  After 6pm go to [email or web address] - password [email or web address] [facility]  [phone]  CC: Primary care physician; Idelle Crouch, MD

## 2019-04-20 NOTE — TOC Transition Note (Signed)
Transition of Care [facility]) - CM/SW Discharge Note   Patient Details  Name: Jessica Blackwell MRN: [id number] Date of Birth: 1925-01-14  [facility] CM/SW Contact:  Shelbie Hutching, RN Phone Number: 04/20/2019, 10:07 AM   Clinical Narrative:    Patient is ready for discharge to [facility] today.  Patient needs one more negative Covid before going.  Family and patient are aware of discharge- pt will be going to rm 508.  [facility] will provide transport.     Final next level of care: Skilled Nursing Facility Barriers to Discharge: Barriers Resolved   Patient Goals and CMS Choice Patient states their goals for this hospitalization and ongoing recovery are:: Regain strength CMS Medicare.gov Compare Post Acute Care list provided to:: Patient Choice offered to / list presented to : Patient, Adult Children  Discharge Placement              Patient chooses bed at: [facility] Patient to be transferred to facility by: [facility] Name of family member notified: Family aware of discharge today- daughters Patient and family notified of of transfer: 04/20/19  Discharge Plan and Services In-house Referral: Clinical Social Work   Post Acute Care Choice: Skilled Nursing Facility([facility], [facility], [facility], [facility])                               Social Determinants of Health (SDOH) Interventions     Readmission Risk Interventions Readmission Risk Prevention Plan 04/10/2019 04/09/2019  Post Dischage Appt Complete Complete  Medication Screening Complete Complete  Transportation Screening Complete Complete  Some recent data might be hidden

## 2019-04-20 NOTE — TOC Transition Note (Signed)
Transition of Care [facility]) - CM/SW Discharge Note   Patient Details  Name: Jessica Blackwell MRN: [id number] Date of Birth: [birth year]  Transition of Care [facility] CM/SW Contact:  Raniah Karan, Lenice Llamas Phone Number: [phone]  04/20/2019, 12:04 PM   Clinical Narrative: Patient is medically stable for D/C to [facility] today. Per [facility] admissions coordinator at [facility] patient can come today to room 507 and they can use the negative covid test from yesterday 8/3. Per Magda Paganini they can use the medicare 3 day waiver to bring patient in under medicare without having a 3 night inpatient stay. RN will call report and arrange EMS for transport. Clinical Education officer, museum (CSW) sent D/C orders to [facility] via [facility]. Patient is aware of above. CSW contacted patient's granddaughter [name] and made her aware of above. Per [name] she is going to [facility] today at 2 pm to complete admissions paper work. Please reconsult if future social work needs arise. CSW signing off.       Final next level of care: Skilled Nursing Facility Barriers to Discharge: Barriers Resolved   Patient Goals and CMS Choice Patient states their goals for this hospitalization and ongoing recovery are:: Regain strength CMS Medicare.gov Compare Post Acute Care list provided to:: Patient Choice offered to / list presented to : Patient, Adult Children  Discharge Placement   Existing PASRR number confirmed : 04/19/19          Patient [facility] bed at: [facility] Patient to be transferred to facility by: [facility] Name of family member notified: Patient's granddaughter [name] is aware of D/C today. Patient and family notified of of transfer: 04/20/19  Discharge Plan and Services In-house Referral: Clinical Social Work   Post Acute Care Choice: Skilled Nursing Facility([facility], [facility], [facility], [facility])                               Social  Determinants of Health (SDOH) Interventions     Readmission Risk Interventions Readmission Risk Prevention Plan 04/10/2019 04/09/2019  Post Dischage Appt Complete Complete  Medication Screening Complete Complete  Transportation Screening Complete Complete  Some recent data might be hidden

## 2019-04-20 NOTE — Progress Notes (Signed)
Please note, patient is currently followed by by [facility] outpatient Palliative at home. This services should be continued at [facility]. CSW [employer] made aware. Flo Shanks BSN, RN, [facility] [employer] [phone]

## 2019-04-20 NOTE — Progress Notes (Signed)
Peripherally Inserted Central Catheter/Midline Placement  The IV Nurse has discussed with the patient and/or persons authorized to consent for the patient, the purpose of this procedure and the potential benefits and risks involved with this procedure.  The benefits include less needle sticks, lab draws from the catheter, and the patient may be discharged home with the catheter. Risks include, but not limited to, infection, bleeding, blood clot (thrombus formation), and puncture of an artery; nerve damage and irregular heartbeat and possibility to perform a PICC exchange if needed/ordered by physician.  Alternatives to this procedure were also discussed.  Bard Power PICC patient education guide, fact sheet on infection prevention and patient information card has been provided to patient /or left at bedside.    PICC/Midline Placement Documentation  PICC Single Lumen 45/40/98 PICC Right Basilic 37 cm 0 cm (Active)  Indication for Insertion or Continuance of Line Home intravenous therapies (PICC only) 04/20/19 0905  Exposed Catheter (cm) 0 cm 04/20/19 0905  Site Assessment Clean;Dry;Intact 04/20/19 0905  Line Status Flushed;Saline locked;Blood return noted 04/20/19 0905  Dressing Type Transparent;Securing device 04/20/19 1191  Dressing Status Clean;Dry;Intact;Antimicrobial disc in place 04/20/19 0905  Dressing Intervention New dressing 04/20/19 0905  Dressing Change Due 04/27/19 04/20/19 0905       Enos Fling 04/20/2019, 9:31 AM

## 2019-04-21 DIAGNOSIS — J9611 Chronic respiratory failure with hypoxia: Secondary | ICD-10-CM | POA: Insufficient documentation

## 2019-04-21 DIAGNOSIS — B9689 Other specified bacterial agents as the cause of diseases classified elsewhere: Secondary | ICD-10-CM | POA: Insufficient documentation

## 2019-04-22 LAB — URINE CULTURE: Culture: 20000 — AB

## 2019-05-02 ENCOUNTER — Other Ambulatory Visit: Payer: Self-pay

## 2019-05-02 ENCOUNTER — Emergency Department: Payer: Medicare Other

## 2019-05-02 ENCOUNTER — Inpatient Hospital Stay
Admission: EM | Admit: 2019-05-02 | Discharge: 2019-05-03 | DRG: 690 | Disposition: A | Discharge status: Skilled Nursing Facility | Payer: Medicare Other | Attending: Internal Medicine | Admitting: Internal Medicine

## 2019-05-02 ENCOUNTER — Encounter: Payer: Self-pay | Admitting: Emergency Medicine

## 2019-05-02 DIAGNOSIS — M1990 Unspecified osteoarthritis, unspecified site: Secondary | ICD-10-CM | POA: Diagnosis present

## 2019-05-02 DIAGNOSIS — I480 Paroxysmal atrial fibrillation: Secondary | ICD-10-CM | POA: Diagnosis present

## 2019-05-02 DIAGNOSIS — J9611 Chronic respiratory failure with hypoxia: Secondary | ICD-10-CM | POA: Diagnosis present

## 2019-05-02 DIAGNOSIS — Z66 Do not resuscitate: Secondary | ICD-10-CM | POA: Diagnosis present

## 2019-05-02 DIAGNOSIS — Z888 Allergy status to other drugs, medicaments and biological substances status: Secondary | ICD-10-CM | POA: Diagnosis not present

## 2019-05-02 DIAGNOSIS — Z8249 Family history of ischemic heart disease and other diseases of the circulatory system: Secondary | ICD-10-CM | POA: Diagnosis not present

## 2019-05-02 DIAGNOSIS — I110 Hypertensive heart disease with heart failure: Secondary | ICD-10-CM | POA: Diagnosis present

## 2019-05-02 DIAGNOSIS — I4819 Other persistent atrial fibrillation: Secondary | ICD-10-CM | POA: Diagnosis present

## 2019-05-02 DIAGNOSIS — I5042 Chronic combined systolic (congestive) and diastolic (congestive) heart failure: Secondary | ICD-10-CM | POA: Diagnosis present

## 2019-05-02 DIAGNOSIS — Z87440 Personal history of urinary (tract) infections: Secondary | ICD-10-CM

## 2019-05-02 DIAGNOSIS — Z885 Allergy status to narcotic agent status: Secondary | ICD-10-CM

## 2019-05-02 DIAGNOSIS — I350 Nonrheumatic aortic (valve) stenosis: Secondary | ICD-10-CM | POA: Diagnosis present

## 2019-05-02 DIAGNOSIS — Z20828 Contact with and (suspected) exposure to other viral communicable diseases: Secondary | ICD-10-CM | POA: Diagnosis present

## 2019-05-02 DIAGNOSIS — R441 Visual hallucinations: Secondary | ICD-10-CM | POA: Diagnosis present

## 2019-05-02 DIAGNOSIS — R410 Disorientation, unspecified: Secondary | ICD-10-CM | POA: Diagnosis present

## 2019-05-02 DIAGNOSIS — N3000 Acute cystitis without hematuria: Secondary | ICD-10-CM | POA: Diagnosis present

## 2019-05-02 DIAGNOSIS — Z7982 Long term (current) use of aspirin: Secondary | ICD-10-CM

## 2019-05-02 DIAGNOSIS — Z882 Allergy status to sulfonamides status: Secondary | ICD-10-CM | POA: Diagnosis not present

## 2019-05-02 DIAGNOSIS — L89152 Pressure ulcer of sacral region, stage 2: Secondary | ICD-10-CM | POA: Diagnosis present

## 2019-05-02 DIAGNOSIS — Z79899 Other long term (current) drug therapy: Secondary | ICD-10-CM

## 2019-05-02 DIAGNOSIS — N390 Urinary tract infection, site not specified: Secondary | ICD-10-CM

## 2019-05-02 DIAGNOSIS — K219 Gastro-esophageal reflux disease without esophagitis: Secondary | ICD-10-CM | POA: Diagnosis present

## 2019-05-02 DIAGNOSIS — F05 Delirium due to known physiological condition: Secondary | ICD-10-CM | POA: Diagnosis present

## 2019-05-02 DIAGNOSIS — L8990 Pressure ulcer of unspecified site, unspecified stage: Secondary | ICD-10-CM | POA: Insufficient documentation

## 2019-05-02 LAB — BASIC METABOLIC PANEL
Anion gap: 9 (ref 5–15)
BUN: 15 mg/dL (ref 8–23)
CO2: 29 mmol/L (ref 22–32)
Calcium: 9.2 mg/dL (ref 8.9–10.3)
Chloride: 100 mmol/L (ref 98–111)
Creatinine, Ser: 0.82 mg/dL (ref 0.44–1.00)
GFR calc Af Amer: >60 mL/min (ref >60)
GFR calc non Af Amer: >60 mL/min (ref >60)
Glucose, Bld: 163 mg/dL — ABNORMAL HIGH (ref 70–99)
Potassium: 3.7 mmol/L (ref 3.5–5.1)
Sodium: 138 mmol/L (ref 135–145)

## 2019-05-02 LAB — SARS CORONAVIRUS 2 BY RT PCR (HOSPITAL ORDER, PERFORMED IN ~~LOC~~ HOSPITAL LAB): SARS Coronavirus 2: NEGATIVE

## 2019-05-02 LAB — CBC
HCT: 45 % (ref 36.0–46.0)
Hemoglobin: 14 g/dL (ref 12.0–15.0)
MCH: 26.5 pg (ref 26.0–34.0)
MCHC: 31.1 g/dL (ref 30.0–36.0)
MCV: 85.1 fL (ref 80.0–100.0)
Platelets: 72 10*3/uL — ABNORMAL LOW (ref 150–400)
RBC: 5.29 MIL/uL — ABNORMAL HIGH (ref 3.87–5.11)
RDW: 15.1 % (ref 11.5–15.5)
WBC: 5 10*3/uL (ref 4.0–10.5)
nRBC: 0 % (ref 0.0–0.2)

## 2019-05-02 LAB — AMMONIA: Ammonia: 17 umol/L (ref 9–35)

## 2019-05-02 LAB — URINE DRUG SCREEN, QUALITATIVE (ARMC ONLY)
Amphetamines, Ur Screen: NOT DETECTED
Barbiturates, Ur Screen: NOT DETECTED
Benzodiazepine, Ur Scrn: NOT DETECTED
Cannabinoid 50 Ng, Ur ~~LOC~~: NOT DETECTED
Cocaine Metabolite,Ur ~~LOC~~: NOT DETECTED
MDMA (Ecstasy)Ur Screen: NOT DETECTED
Methadone Scn, Ur: NOT DETECTED
Opiate, Ur Screen: NOT DETECTED
Phencyclidine (PCP) Ur S: NOT DETECTED
Tricyclic, Ur Screen: NOT DETECTED

## 2019-05-02 LAB — URINALYSIS, COMPLETE (UACMP) WITH MICROSCOPIC
Bilirubin Urine: NEGATIVE
Glucose, UA: NEGATIVE mg/dL
Ketones, ur: NEGATIVE mg/dL
Leukocytes,Ua: NEGATIVE
Nitrite: POSITIVE — AB
Protein, ur: NEGATIVE mg/dL
Specific Gravity, Urine: 1.01 (ref 1.005–1.030)
pH: 6 (ref 5.0–8.0)

## 2019-05-02 LAB — TSH: TSH: 0.343 u[IU]/mL — ABNORMAL LOW (ref 0.350–4.500)

## 2019-05-02 LAB — MRSA PCR SCREENING: MRSA by PCR: NEGATIVE

## 2019-05-02 LAB — FOLATE: Folate: 9.7 ng/mL (ref >5.9)

## 2019-05-02 MED ORDER — ENOXAPARIN SODIUM 40 MG/0.4ML ~~LOC~~ SOLN: 40.0000 mg | SUBCUTANEOUS | Status: DC

## 2019-05-02 MED ORDER — PANTOPRAZOLE SODIUM 40 MG PO TBEC
40.0000 mg | DELAYED_RELEASE_TABLET | Freq: Every day | ORAL | Status: DC
  Administered 2019-05-03: 40 mg via ORAL
  Filled 2019-05-02: qty 1

## 2019-05-02 MED ORDER — SPIRONOLACTONE 25 MG PO TABS
12.5000 mg | ORAL_TABLET | Freq: Every day | ORAL | Status: DC
  Administered 2019-05-03: 12.5 mg via ORAL
  Filled 2019-05-02: qty 1
  Filled 2019-05-02: qty 0.5

## 2019-05-02 MED ORDER — ACETAMINOPHEN 650 MG RE SUPP: 650.0000 mg | Freq: Four times a day (QID) | RECTAL | Status: DC | PRN

## 2019-05-02 MED ORDER — LORATADINE 10 MG PO TABS
10.0000 mg | ORAL_TABLET | Freq: Every day | ORAL | Status: DC
  Administered 2019-05-03: 10 mg via ORAL
  Filled 2019-05-02: qty 1

## 2019-05-02 MED ORDER — ONDANSETRON HCL 4 MG/2ML IJ SOLN: 4.0000 mg | Freq: Four times a day (QID) | INTRAMUSCULAR | Status: DC | PRN

## 2019-05-02 MED ORDER — ACETAMINOPHEN 325 MG PO TABS: 650.0000 mg | ORAL_TABLET | Freq: Four times a day (QID) | ORAL | Status: DC | PRN

## 2019-05-02 MED ORDER — SODIUM CHLORIDE 0.9 % IV SOLN
1.0000 g | Freq: Once | INTRAVENOUS | Status: AC
  Administered 2019-05-02: 1 g via INTRAVENOUS
  Filled 2019-05-02: qty 1

## 2019-05-02 MED ORDER — FUROSEMIDE 20 MG PO TABS
20.0000 mg | ORAL_TABLET | Freq: Two times a day (BID) | ORAL | Status: DC
  Administered 2019-05-03: 09:00:00 20 mg via ORAL
  Filled 2019-05-02: qty 1

## 2019-05-02 MED ORDER — ONDANSETRON HCL 4 MG PO TABS: 4.0000 mg | ORAL_TABLET | Freq: Four times a day (QID) | ORAL | Status: DC | PRN

## 2019-05-02 MED ORDER — SODIUM CHLORIDE 0.9 % IV SOLN
1.0000 g | Freq: Two times a day (BID) | INTRAVENOUS | Status: DC
  Administered 2019-05-02: 1 g via INTRAVENOUS
  Filled 2019-05-02 (×3): qty 1

## 2019-05-02 MED ORDER — FERROUS SULFATE 325 (65 FE) MG PO TABS
325.0000 mg | ORAL_TABLET | Freq: Every day | ORAL | Status: DC
  Filled 2019-05-02: qty 1

## 2019-05-02 MED ORDER — ASPIRIN EC 81 MG PO TBEC
81.0000 mg | DELAYED_RELEASE_TABLET | Freq: Every day | ORAL | Status: DC
  Administered 2019-05-03: 81 mg via ORAL
  Filled 2019-05-02: qty 1

## 2019-05-02 MED ORDER — METOPROLOL TARTRATE 25 MG PO TABS
25.0000 mg | ORAL_TABLET | Freq: Two times a day (BID) | ORAL | Status: DC
  Administered 2019-05-02 – 2019-05-03 (×2): 25 mg via ORAL
  Filled 2019-05-02 (×2): qty 1

## 2019-05-02 MED ORDER — ALBUTEROL SULFATE (2.5 MG/3ML) 0.083% IN NEBU: 3.0000 mL | INHALATION_SOLUTION | Freq: Four times a day (QID) | RESPIRATORY_TRACT | Status: DC | PRN

## 2019-05-02 MED ORDER — POLYETHYLENE GLYCOL 3350 17 G PO PACK: 17.0000 g | PACK | Freq: Every day | ORAL | Status: DC | PRN

## 2019-05-02 MED ORDER — GABAPENTIN 300 MG PO CAPS
300.0000 mg | ORAL_CAPSULE | Freq: Three times a day (TID) | ORAL | Status: DC
  Administered 2019-05-02 – 2019-05-03 (×2): 300 mg via ORAL
  Filled 2019-05-02 (×2): qty 1

## 2019-05-02 MED ORDER — AMIODARONE HCL 200 MG PO TABS
100.0000 mg | ORAL_TABLET | Freq: Every day | ORAL | Status: DC
  Administered 2019-05-03: 100 mg via ORAL
  Filled 2019-05-02: qty 1

## 2019-05-02 NOTE — ED Notes (Signed)
TTS and psychiatry at bedside.

## 2019-05-02 NOTE — ED Provider Notes (Signed)
[facility] Emergency Department Provider Note   ____________________________________________   None    (approximate)  I have reviewed the triage vital signs and the nursing notes.   HISTORY  Chief Complaint Hallucinations    HPI Jessica Blackwell is a 85 y.o. female with past medical history of hypertension, CHF, frequent UTI presents to the ED for hallucinations.  Per EMS, patient complained of seeing people in the room at her nursing facility and stated that they ran out of the room when the door was opened.  She denies seeing anything that not there currently, but continues to state that people were in her room earlier.  She has no history of auditory or visual hallucinations.  She states she feels well currently with no pain and denies any recent fevers.  She was recently admitted for UTI and completed a course of ertapenem.        Past Medical History:  Diagnosis Date  . Aortic stenosis   . Arthritis   . CHF (congestive heart failure) ([place])   . Fall at home 07/28/2015  . GERD (gastroesophageal reflux disease)   . H/O bladder infections   . Hypertension   . Neuropathy    lower extrmities    Patient Active Problem List   Diagnosis Date Noted  . Delirium due to another medical condition 05/02/2019  . Visual hallucinations   . UTI (urinary tract infection) 04/06/2019  . CHF (congestive heart failure) ([place]) 03/26/2019  . Non-intractable vomiting with nausea   . LOC (loss of consciousness) ([place]) 12/22/2016  . AKI (acute kidney injury) ([place]) 08/01/2015  . V-tach ([place]) 08/01/2015  . NSVT (nonsustained ventricular tachycardia) ([place])   . Persistent atrial fibrillation   . Acute systolic congestive heart failure ([place]) 07/31/2015  . Acute respiratory failure with hypoxia ([place]) 07/28/2015  . Fall at home 07/28/2015  . GERD (gastroesophageal reflux disease) 07/28/2015  . HTN (hypertension) 07/28/2015  . Anemia 07/28/2015    Past Surgical  History:  Procedure Laterality Date  . ABDOMINAL HYSTERECTOMY    . CATARACT EXTRACTION    . COLON SURGERY    . ESOPHAGOGASTRODUODENOSCOPY N/A 05/22/2018   Procedure: ESOPHAGOGASTRODUODENOSCOPY (EGD);  Surgeon: Virgel Manifold, MD;  Location: [facility] ENDOSCOPY;  Service: Endoscopy;  Laterality: N/A;  . EYE SURGERY    . FOOT SURGERY Right   . ROTATOR CUFF REPAIR Left   . TONSILLECTOMY      Prior to Admission medications   Medication Sig Start Date End Date Taking? Authorizing Provider  acetaminophen (TYLENOL) 325 MG tablet Take 2 tablets (650 mg total) by mouth every 6 (six) hours as needed for mild pain (or Fever >/= 101). 05/26/18  Yes Gouru, Aruna, MD  albuterol (VENTOLIN HFA) 108 (90 Base) MCG/ACT inhaler Inhale 2 puffs into the lungs every 6 (six) hours as needed for wheezing or shortness of breath. 03/28/19  Yes Gouru, Illene Silver, MD  amiodarone (PACERONE) 200 MG tablet Take 0.5 tablets (100 mg total) by mouth daily. 04/09/19  Yes Wieting, Richard, MD  aspirin EC 81 MG tablet Take 81 mg by mouth daily.   Yes [provider]  ferrous sulfate 325 (65 FE) MG tablet Take 325 mg by mouth daily.   Yes [provider]  fexofenadine (ALLEGRA) 60 MG tablet Take 60 mg by mouth daily. Pt daughter said pt takes this as needed, nurse home doing it daily   Yes [provider]  furosemide (LASIX) 20 MG tablet Take 20 mg by mouth  2 (two) times daily.   Yes [provider]  gabapentin (NEURONTIN) 300 MG capsule Take 300 mg by mouth 3 (three) times daily.   Yes [provider]  metoprolol tartrate (LOPRESSOR) 25 MG tablet Take 1 tablet (25 mg total) by mouth 2 (two) times daily. 05/26/18  Yes Gouru, Illene Silver, MD  pantoprazole (PROTONIX) 40 MG tablet Take 1 tablet (40 mg total) by mouth daily. 04/09/19  Yes Wieting, Richard, MD  polyethylene glycol (MIRALAX / GLYCOLAX) 17 g packet Take 17 g by mouth daily as needed for severe constipation. 04/09/19  Yes Wieting, Richard, MD   spironolactone (ALDACTONE) 25 MG tablet Take 12.5 mg by mouth daily.   Yes [provider]    Allergies Ciprofloxacin, Codeine, and Sulfamethoxazole-trimethoprim  Family History  Problem Relation Age of Onset  . Hypertension Father   . CAD Mother     Social History Social History   Tobacco Use  . Smoking status: Never Smoker  . Smokeless tobacco: Never Used  Substance Use Topics  . Alcohol use: No  . Drug use: No    Review of Systems  Constitutional: No fever/chills Eyes: No visual changes. ENT: No sore throat. Cardiovascular: Denies chest pain. Respiratory: Denies shortness of breath. Gastrointestinal: No abdominal pain.  No nausea, no vomiting.  No diarrhea.  No constipation. Genitourinary: Negative for dysuria. Musculoskeletal: Negative for back pain. Skin: Negative for rash. Neurological: Negative for headaches, focal weakness or numbness.  Positive for hallucinations.  ____________________________________________   PHYSICAL EXAM:  VITAL SIGNS: ED Triage Vitals  Enc Vitals Group     BP 05/02/19 1041 (!) 119/58     Pulse Rate 05/02/19 1038 (!) 52     Resp 05/02/19 1038 18     Temp 05/02/19 1040 97.8 F (36.6 C)     Temp src --      SpO2 05/02/19 1038 95 %     Weight 05/02/19 1040 148 lb 13 oz (67.5 kg)     Height 05/02/19 1040 5\' 1"  (1.549 m)     Head Circumference --      Peak Flow --      Pain Score 05/02/19 1039 0     Pain Loc --      Pain Edu? --      Excl. in [place]? --     Constitutional: Alert and oriented. Eyes: Conjunctivae are normal. Head: Atraumatic. Nose: No congestion/rhinnorhea. Mouth/Throat: Mucous membranes are moist. Neck: Normal ROM Cardiovascular: Normal rate, regular rhythm. Grossly normal heart sounds. Respiratory: Normal respiratory effort.  No retractions. Lungs CTAB. Gastrointestinal: Soft and nontender. No distention. Genitourinary: deferred Musculoskeletal: No lower extremity tenderness nor edema. Neurologic:   Normal speech and language. No gross focal neurologic deficits are appreciated. Skin:  Skin is warm, dry and intact. No rash noted. Psychiatric: Mood and affect are normal. Speech and behavior are normal.  ____________________________________________   LABS (all labs ordered are listed, but only abnormal results are displayed)  Labs Reviewed  BASIC METABOLIC PANEL - Abnormal; Notable for the following components:      Result Value   Glucose, Bld 163 (*)    All other components within normal limits  CBC - Abnormal; Notable for the following components:   RBC 5.29 (*)    Platelets 72 (*)    All other components within normal limits  URINALYSIS, COMPLETE (UACMP) WITH MICROSCOPIC - Abnormal; Notable for the following components:   Color, Urine YELLOW (*)    APPearance CLEAR (*)  Hgb urine dipstick SMALL (*)    Nitrite POSITIVE (*)    Bacteria, UA MANY (*)    All other components within normal limits  URINE CULTURE  SARS CORONAVIRUS 2  CBG MONITORING, ED   ____________________________________________  EKG  ED ECG REPORT I, Blake Divine, the attending physician, personally viewed and interpreted this ECG.   Date: 05/02/2019  EKG Time: 10:38  Rate: 58  Rhythm: normal sinus rhythm, LBBB  Axis: LAD  Intervals:left bundle branch block  ST&T Change: Negative Sgarbossa  PROCEDURES  Procedure(s) performed (including Critical Care):  Procedures   ____________________________________________   INITIAL IMPRESSION / ASSESSMENT AND PLAN / ED COURSE       85 year old female presenting with new onset visual hallucinations over the past couple of days.  No focal neurologic deficits noted on exam, head CT negative for acute process.  Patient alert and oriented at this time, denies visual hallucinations currently but continues to report having them earlier.  Visual hallucinations concerning for medical cause and patient has no psychiatric history.  Labs unremarkable but UA  consistent with UTI.  Patient with multiple recent UTIs with multiple resistances on culture.  Case discussed with hospitalist, will start meropenem for now until culture results obtained.      ____________________________________________   FINAL CLINICAL IMPRESSION(S) / ED DIAGNOSES  Final diagnoses:  Visual hallucinations  Delirium  Acute cystitis without hematuria     ED Discharge Orders    None       Note:  This document was prepared using Dragon voice recognition software and may include unintentional dictation errors.   Blake Divine, MD 05/02/19 [phone]

## 2019-05-02 NOTE — Progress Notes (Signed)
Family Meeting Note  Advance Directive:no  Today a meeting took place with the Patient and family at bedside.  Patient is unable to participate due to: AMS  The following clinical team members were present during this meeting:MD  The following were discussed:Patient's diagnosis: AMS/visual hallucinations, Patient's progosis: Unable to determine and Goals for treatment: DNR  Additional follow-up to be provided: prn  Time spent during discussion:20 minutes  Evette Doffing, MD

## 2019-05-02 NOTE — Consult Note (Signed)
Pharmacy Antibiotic Note  Jessica Blackwell is a 85 y.o. female admitted on 05/02/2019 with UTI with history of ESBL.    Pharmacy has been consulted for Meropenem dosing.  Plan: Meropenem 1g q12h  Height: 5\' 1"  (154.9 cm) Weight: 148 lb 13 oz (67.5 kg) IBW/kg (Calculated) : 47.8  Temp (24hrs), Avg:97.8 F (36.6 C), Min:97.8 F (36.6 C), Max:97.8 F (36.6 C)  Recent Labs  Lab 05/02/19 1045  WBC 5.0  CREATININE 0.82    Estimated Creatinine Clearance: 38.5 mL/min (by C-G formula based on SCr of 0.82 mg/dL).    Allergies  Allergen Reactions  . Ciprofloxacin Itching and Rash  . Codeine Other (See Comments)    Upset stomach  . Sulfamethoxazole-Trimethoprim Rash    Antimicrobials this admission: 8/16 Meropenem >>  Dose adjustments this admission: None  Microbiology results: 8/16 UCx: pending   Thank you for allowing pharmacy to be a part of this patient's care.  Lu Duffel, PharmD, BCPS Clinical Pharmacist 05/02/2019 5:36 PM

## 2019-05-02 NOTE — Consult Note (Signed)
[facility] Face-to-Face Psychiatry Consult   Reason for Consult:  Hallucinations  Referring Physician:  EDP Patient Identification: Jessica Blackwell MRN:  [id number] Principal Diagnosis: Delirium Diagnosis:  Active Problems:   Delirium due to another medical condition   Total Time spent with patient: 1 hour  Subjective:   Jessica Blackwell is a 85 y.o. female patient cognition is fluctuating on assessment, difficulty staying awake yet restless.  Patient seen and evaluated in person face-to-face by this provider.  Patient fluctuated between sleeping and restlessness with wanting to walk.  Most of the information obtained by her granddaughter at there bedside.  She reports her hallucinations started Friday night after a staff member at the facility reported she had a sleeping medications, "that was never ordered" per granddaughter.  Her hallucinations increased with lack of sleep and she started seeing rooms and people in the rooms taking things out.  Thought someone was in her room trying to kill her.  This is very out-of-character for her.  She was living independently until July after discharge from the hospital, she was falling and could not walk with her walker as she had in the past.  Admitted again and discharged to a SNF.  She did regain some of her strength back and took steps last week with PT.  Granddaughter reports her "eyes rolling back" at times when she talks to the patient.  HPI:  85 yo female presented to the ED after hallucinations of seeing people in her room at the SNF.  These hallucinations started on Friday night and increased over the weekend.  No hallucinations in the past, no psychiatric history.  Recently discharged, August 8, after treatment for a UTI.    Past Psychiatric History: none  Risk to Self:  none Risk to Others:  none Prior Inpatient Therapy:  none Prior Outpatient Therapy:  none  Past Medical History:  Past Medical History:  Diagnosis Date  . Aortic stenosis    . Arthritis   . CHF (congestive heart failure) ([place])   . Fall at home 07/28/2015  . GERD (gastroesophageal reflux disease)   . H/O bladder infections   . Hypertension   . Neuropathy    lower extrmities    Past Surgical History:  Procedure Laterality Date  . ABDOMINAL HYSTERECTOMY    . CATARACT EXTRACTION    . COLON SURGERY    . ESOPHAGOGASTRODUODENOSCOPY N/A 05/22/2018   Procedure: ESOPHAGOGASTRODUODENOSCOPY (EGD);  Surgeon: Virgel Manifold, MD;  Location: [facility] ENDOSCOPY;  Service: Endoscopy;  Laterality: N/A;  . EYE SURGERY    . FOOT SURGERY Right   . ROTATOR CUFF REPAIR Left   . TONSILLECTOMY     Family History:  Family History  Problem Relation Age of Onset  . Hypertension Father   . CAD Mother    Family Psychiatric  History: none Social History:  Social History   Substance and Sexual Activity  Alcohol Use No     Social History   Substance and Sexual Activity  Drug Use No    Social History   Socioeconomic History  . Marital status: Widowed    Spouse name: Not on file  . Number of children: 2  . Years of education: Not on file  . Highest education level: Not on file  Occupational History  . Not on file  Social Needs  . Financial resource strain: Not hard at all  . Food insecurity    Worry: Patient refused    Inability: Patient refused  . Transportation  needs    Medical: Patient refused    Non-medical: Patient refused  Tobacco Use  . Smoking status: Never Smoker  . Smokeless tobacco: Never Used  Substance and Sexual Activity  . Alcohol use: No  . Drug use: No  . Sexual activity: Not Currently  Lifestyle  . Physical activity    Days per week: Patient refused    Minutes per session: Patient refused  . Stress: Not at all  Relationships  . Social Herbalist on phone: Patient refused    Gets together: Patient refused    Attends religious service: Patient refused    Active member of club or organization: Patient refused    Attends  meetings of clubs or organizations: Patient refused    Relationship status: Widowed  Other Topics Concern  . Not on file  Social History Narrative  . Not on file   Additional Social History:    Allergies:   Allergies  Allergen Reactions  . Ciprofloxacin Itching and Rash  . Codeine Other (See Comments)    Upset stomach  . Sulfamethoxazole-Trimethoprim Rash    Labs:  Results for orders placed or performed during the hospital encounter of 05/02/19 (from the past 48 hour(s))  Basic metabolic panel     Status: Abnormal   Collection Time: 05/02/19 10:45 AM  Result Value Ref Range   Sodium 138 135 - 145 mmol/L   Potassium 3.7 3.5 - 5.1 mmol/L   Chloride 100 98 - 111 mmol/L   CO2 29 22 - 32 mmol/L   Glucose, Bld 163 (H) 70 - 99 mg/dL   BUN 15 8 - 23 mg/dL   Creatinine, Ser 0.82 0.44 - 1.00 mg/dL   Calcium 9.2 8.9 - 10.3 mg/dL   GFR calc non Af Amer >60 >60 mL/min   GFR calc Af Amer >60 >60 mL/min   Anion gap 9 5 - 15    Comment: Performed at [facility], [address] [place], [place] [address]  CBC     Status: Abnormal   Collection Time: 05/02/19 10:45 AM  Result Value Ref Range   WBC 5.0 4.0 - 10.5 K/uL   RBC 5.29 (H) 3.87 - 5.11 MIL/uL   Hemoglobin 14.0 12.0 - 15.0 g/dL   HCT 45.0 36.0 - 46.0 %   MCV 85.1 80.0 - 100.0 fL   MCH 26.5 26.0 - 34.0 pg   MCHC 31.1 30.0 - 36.0 g/dL   RDW 15.1 11.5 - 15.5 %   Platelets 72 (L) 150 - 400 K/uL    Comment: Immature Platelet Fraction may be clinically indicated, consider ordering this additional test [id number]    nRBC 0.0 0.0 - 0.2 %    Comment: Performed at [facility], [address] [place], [place] [address]  Urinalysis, Complete w Microscopic     Status: Abnormal   Collection Time: 05/02/19 12:35 PM  Result Value Ref Range   Color, Urine YELLOW (A) YELLOW   APPearance CLEAR (A) CLEAR   Specific Gravity, Urine 1.010 1.005 - 1.030   pH 6.0 5.0 - 8.0   Glucose, UA NEGATIVE NEGATIVE mg/dL   Hgb  urine dipstick SMALL (A) NEGATIVE   Bilirubin Urine NEGATIVE NEGATIVE   Ketones, ur NEGATIVE NEGATIVE mg/dL   Protein, ur NEGATIVE NEGATIVE mg/dL   Nitrite POSITIVE (A) NEGATIVE   Leukocytes,Ua NEGATIVE NEGATIVE   RBC / HPF 0-5 0 - 5 RBC/hpf   WBC, UA 0-5 0 - 5 WBC/hpf   Bacteria,  UA MANY (A) NONE SEEN   Squamous Epithelial / LPF 0-5 0 - 5   Mucus PRESENT    Hyaline Casts, UA PRESENT     Comment: Performed at [facility], [address] [place], [place] [address]    No current facility-administered medications for this encounter.    Current Outpatient Medications  Medication Sig Dispense Refill  . acetaminophen (TYLENOL) 325 MG tablet Take 2 tablets (650 mg total) by mouth every 6 (six) hours as needed for mild pain (or Fever >/= 101).    Marland Kitchen albuterol (VENTOLIN HFA) 108 (90 Base) MCG/ACT inhaler Inhale 2 puffs into the lungs every 6 (six) hours as needed for wheezing or shortness of breath. 8 g 0  . amiodarone (PACERONE) 200 MG tablet Take 0.5 tablets (100 mg total) by mouth daily.    Marland Kitchen aspirin EC 81 MG tablet Take 81 mg by mouth daily.    . ferrous sulfate 325 (65 FE) MG tablet Take 325 mg by mouth daily.    . fexofenadine (ALLEGRA) 60 MG tablet Take 60 mg by mouth daily. Pt daughter said pt takes this as needed, nurse home doing it daily    . furosemide (LASIX) 20 MG tablet Take 20 mg by mouth 2 (two) times daily.    Marland Kitchen gabapentin (NEURONTIN) 300 MG capsule Take 300 mg by mouth 3 (three) times daily.    . metoprolol tartrate (LOPRESSOR) 25 MG tablet Take 1 tablet (25 mg total) by mouth 2 (two) times daily.    . pantoprazole (PROTONIX) 40 MG tablet Take 1 tablet (40 mg total) by mouth daily.    . polyethylene glycol (MIRALAX / GLYCOLAX) 17 g packet Take 17 g by mouth daily as needed for severe constipation. 14 each 0  . spironolactone (ALDACTONE) 25 MG tablet Take 12.5 mg by mouth daily.      Musculoskeletal: Strength & Muscle Tone: decreased Gait & Station: did not  witness Patient leans: N/A  Psychiatric Specialty Exam: Physical Exam  Nursing note and vitals reviewed. Constitutional: She appears well-developed and well-nourished.  HENT:  Head: Normocephalic.  Neck: Normal range of motion.  Respiratory: Effort normal.  Neurological: She is alert.  Psychiatric: Her speech is normal. Her mood appears anxious. She is actively hallucinating. Thought content is delusional. Cognition and memory are impaired. She expresses impulsivity.    Review of Systems  Constitutional: Positive for malaise/fatigue.  Neurological: Positive for weakness.  Psychiatric/Behavioral: Positive for hallucinations. The patient is nervous/anxious and has insomnia.   All other systems reviewed and are negative.   Blood pressure (!) 122/55, pulse 65, temperature 97.8 F (36.6 C), resp. rate (!) 23, height 5\' 1"  (1.549 m), weight 67.5 kg, SpO2 94 %.Body mass index is 28.12 kg/m.  General Appearance: Casual  Eye Contact:  Minimal  Speech:  Normal Rate  Volume:  Decreased  Mood:  Anxious  Affect:  Congruent  Thought Process:  Descriptions of Associations: Tangential  Orientation:  Other:  person  Thought Content:  Delusions and Hallucinations: Auditory Visual  Suicidal Thoughts:  No  Homicidal Thoughts:  No  Memory:  Immediate;   Poor Recent;   Poor Remote;   Poor  Judgement:  Impaired  Insight:  Lacking  Psychomotor Activity:  Decreased  Concentration:  Concentration: Poor and Attention Span: Poor  Recall:  Poor  Fund of Knowledge:  unable to assess  Language:  minimal  Akathisia:  No  Handed:  Right  AIMS (if indicated):     Assets:  Housing Leisure Time Resilience Social Support  ADL's:  Impaired  Cognition:  Impaired,  Moderate  Sleep:        Treatment Plan Summary: Daily contact with patient to assess and evaluate symptoms and progress in treatment, Medication management and Plan Delirium due to another medical condition:  -EDP evaluating the  medical side of things -Refrain from medications at this time, not agitated and very drowsy  Disposition: No evidence of imminent risk to self or others at present.   Supportive therapy provided about ongoing stressors. Discussed crisis plan, support from social network, calling 911, coming to the Emergency Department, and calling Suicide Hotline.  Waylan Boga, NP 05/02/2019 2:53 PM

## 2019-05-02 NOTE — ED Triage Notes (Signed)
Pt brought in by [facility] from [facility]. Pt states that she started to seeing people in her room yesterday morning. She stated that there were 8 men in her room this morning and they ran out when EMS arrived.

## 2019-05-02 NOTE — ED Notes (Signed)
ED TO INPATIENT HANDOFF REPORT  ED Nurse Name and Phone #: Cliffton Asters [phone]  S Name/Age/Gender Jessica Blackwell 85 y.o. female Room/Bed: ED07A/ED07A  Code Status   Code Status: Prior  Home/SNF/Other Rehab Patient oriented to: self, place, time and situation Is this baseline? Pt is hallcinating, aware that she is hallucinating, but A&O x4  Triage Complete: Triage complete  Chief Complaint AMS  Triage Note Pt brought in by [facility] from [facility]. Pt states that she started to seeing people in her room yesterday morning. She stated that there were 8 men in her room this morning and they ran out when EMS arrived.   Allergies Allergies  Allergen Reactions  . Ciprofloxacin Itching and Rash  . Codeine Other (See Comments)    Upset stomach  . Sulfamethoxazole-Trimethoprim Rash    Level of Care/Admitting Diagnosis ED Disposition    ED Disposition Condition [facility] Area: [place] [100120]  Level of Care: Med-Surg [16]  Covid Evaluation: Asymptomatic Screening Protocol (No Symptoms)  Diagnosis: Visual hallucinations [350093]  Admitting Physician: Hyman Bible DODD [[id number]]  Attending Physician: Hyman Bible DODD [[id number]]  Estimated length of stay: past midnight tomorrow  Certification:: I certify this patient will need inpatient services for at least 2 midnights  PT Class (Do Not Modify): Inpatient [101]  PT Acc Code (Do Not Modify): Private [1]       B Medical/Surgery History Past Medical History:  Diagnosis Date  . Aortic stenosis   . Arthritis   . CHF (congestive heart failure) ([place])   . Fall at home 07/28/2015  . GERD (gastroesophageal reflux disease)   . H/O bladder infections   . Hypertension   . Neuropathy    lower extrmities   Past Surgical History:  Procedure Laterality Date  . ABDOMINAL HYSTERECTOMY    . CATARACT EXTRACTION    . COLON SURGERY    . ESOPHAGOGASTRODUODENOSCOPY N/A 05/22/2018    Procedure: ESOPHAGOGASTRODUODENOSCOPY (EGD);  Surgeon: Virgel Manifold, MD;  Location: [facility] ENDOSCOPY;  Service: Endoscopy;  Laterality: N/A;  . EYE SURGERY    . FOOT SURGERY Right   . ROTATOR CUFF REPAIR Left   . TONSILLECTOMY       A IV Location/Drains/Wounds Patient Lines/Drains/Airways Status   Active Line/Drains/Airways    Name:   Placement date:   Placement time:   Site:   Days:   Peripheral IV 05/02/19 Left Forearm   05/02/19    1047    Forearm   less than 1   PICC Single Lumen 93/81/01 PICC Right Basilic 37 cm 0 cm   75/10/25    8527    Basilic   12   External Urinary Catheter   04/07/19    0024    -   25          Intake/Output Last 24 hours  Intake/Output Summary (Last 24 hours) at 05/02/2019 1739 Last data filed at 05/02/2019 1659 Gross per 24 hour  Intake 100 ml  Output -  Net 100 ml    Labs/Imaging Results for orders placed or performed during the hospital encounter of 05/02/19 (from the past 48 hour(s))  Basic metabolic panel     Status: Abnormal   Collection Time: 05/02/19 10:45 AM  Result Value Ref Range   Sodium 138 135 - 145 mmol/L   Potassium 3.7 3.5 - 5.1 mmol/L   Chloride 100 98 - 111 mmol/L   CO2 29 22 - 32 mmol/L  Glucose, Bld 163 (H) 70 - 99 mg/dL   BUN 15 8 - 23 mg/dL   Creatinine, Ser 0.82 0.44 - 1.00 mg/dL   Calcium 9.2 8.9 - 10.3 mg/dL   GFR calc non Af Amer >60 >60 mL/min   GFR calc Af Amer >60 >60 mL/min   Anion gap 9 5 - 15    Comment: Performed at [facility], [address] [place], [place]  [address]  CBC     Status: Abnormal   Collection Time: 05/02/19 10:45 AM  Result Value Ref Range   WBC 5.0 4.0 - 10.5 K/uL   RBC 5.29 (H) 3.87 - 5.11 MIL/uL   Hemoglobin 14.0 12.0 - 15.0 g/dL   HCT 45.0 36.0 - 46.0 %   MCV 85.1 80.0 - 100.0 fL   MCH 26.5 26.0 - 34.0 pg   MCHC 31.1 30.0 - 36.0 g/dL   RDW 15.1 11.5 - 15.5 %   Platelets 72 (L) 150 - 400 K/uL    Comment: Immature Platelet Fraction may be clinically indicated,  consider ordering this additional test [id number]    nRBC 0.0 0.0 - 0.2 %    Comment: Performed at [facility], [address] [place], [place] [address]  Urinalysis, Complete w Microscopic     Status: Abnormal   Collection Time: 05/02/19 12:35 PM  Result Value Ref Range   Color, Urine YELLOW (A) YELLOW   APPearance CLEAR (A) CLEAR   Specific Gravity, Urine 1.010 1.005 - 1.030   pH 6.0 5.0 - 8.0   Glucose, UA NEGATIVE NEGATIVE mg/dL   Hgb urine dipstick SMALL (A) NEGATIVE   Bilirubin Urine NEGATIVE NEGATIVE   Ketones, ur NEGATIVE NEGATIVE mg/dL   Protein, ur NEGATIVE NEGATIVE mg/dL   Nitrite POSITIVE (A) NEGATIVE   Leukocytes,Ua NEGATIVE NEGATIVE   RBC / HPF 0-5 0 - 5 RBC/hpf   WBC, UA 0-5 0 - 5 WBC/hpf   Bacteria, UA MANY (A) NONE SEEN   Squamous Epithelial / LPF 0-5 0 - 5   Mucus PRESENT    Hyaline Casts, UA PRESENT     Comment: Performed at [facility], [address] , [place] [address]   Ct Head Wo Contrast  Result Date: 05/02/2019 CLINICAL DATA:  Confusion, altered mental status, possible UTI. EXAM: CT HEAD WITHOUT CONTRAST TECHNIQUE: Contiguous axial images were obtained from the base of the skull through the vertex without intravenous contrast. COMPARISON:  Head CT dated 12/22/2016. FINDINGS: Brain: Mild generalized age related parenchymal volume loss with commensurate dilatation of the ventricles and sulci. Mild chronic small vessel ischemic changes within the deep periventricular white matter regions bilaterally. No mass, hemorrhage, edema or other evidence of acute parenchymal abnormality. No extra-axial hemorrhage. Vascular: Chronic calcified atherosclerotic changes of the large vessels at the skull base. No unexpected hyperdense vessel. Skull: Normal. Negative for fracture or focal lesion. Sinuses/Orbits: No acute finding. Other: None. IMPRESSION: 1. No acute findings. No intracranial mass, hemorrhage or edema. 2. Atrophy and chronic small  vessel ischemic changes in the white matter. Electronically Signed   By: Franki Cabot M.D.   On: 05/02/2019 15:19    Pending Labs Unresulted Labs (From admission, onward)    Start     Ordered   05/03/19 0500  Creatinine, serum  Tomorrow morning,   STAT     05/02/19 1736   05/02/19 1612  SARS Coronavirus 2 [facility] order, Performed in [facility] hospital lab)  Once,   R  05/02/19 1612   05/02/19 1442  Urine culture  Add-on,   AD     05/02/19 1441   Signed and Held  Basic metabolic panel  Tomorrow morning,   R     Signed and Held   Signed and Held  CBC  Tomorrow morning,   R     Signed and Held   Signed and Held  HIV Antibody (routine testing w rflx)  Once,   R     Signed and Held   Signed and Held  RPR  Once,   R     Signed and Held   Signed and Held  TSH  Once,   R     Signed and Held   Signed and Held  Vitamin B12  Once,   R     Signed and Held   Signed and Held  Folate  Once,   R     Signed and Held   Signed and Held  Ammonia  Once,   R     Signed and Held   Signed and Held  Urine Drug Screen, Biomedical engineer ([place] only)  Once,   R     Signed and Held          Vitals/Pain Today's Vitals   05/02/19 1530 05/02/19 1545 05/02/19 1600 05/02/19 1700  BP: 129/70  135/72   Pulse: 82 (!) 34    Resp: 19 (!) 23 20   Temp:      SpO2: 100% (!) 74%    Weight:      Height:      PainSc:    0-No pain    Isolation Precautions No active isolations  Medications Medications  meropenem (MERREM) 1 g in sodium chloride 0.9 % 100 mL IVPB (has no administration in time range)  meropenem (MERREM) 1 g in sodium chloride 0.9 % 100 mL IVPB (0 g Intravenous Stopped 05/02/19 1659)    Mobility walks with device Moderate fall risk   Focused Assessments See other assessments   R Recommendations: See Admitting Provider Note  Report given to:   Additional Notes:

## 2019-05-02 NOTE — H&P (Signed)
[name] [facility] [place] NAME: Jessica Blackwell    MR#:  [id number]  DATE OF BIRTH:  1925-02-06  DATE OF ADMISSION:  05/02/2019  PRIMARY CARE PHYSICIAN: Idelle Crouch, MD   REQUESTING/REFERRING PHYSICIAN: Blake Divine, MD  CHIEF COMPLAINT:   Chief Complaint  Patient presents with  . Hallucinations    HISTORY OF PRESENT ILLNESS:  Jessica Blackwell  is a 85 y.o. female with a known history of aortic stenosis, chronic CHF, hypertension, history of recurrent UTI who presented to the ED from [facility] with altered mental status and visual hallucinations.  Patient was given a "sleeping pill" on Friday night and then began "talking crazy".  Patient states that she was seeing ghosts and people in her room.  She also saw a woman in a purple jumpsuit who was trying to get her to eat cereal.  Per family at bedside, there was a maintenance man in her SNF room, and patient was worried that he was trying to kill her.  She has otherwise not had any other concerns.  She denies any chest pain, shortness of breath, abdominal pain, dysuria, urinary frequency, urinary urgency.  In the ED, vitals were overall unremarkable.  She was stable on her home 2 L O2.  Labs were unremarkable, except for UA showing small hemoglobin, positive nitrites, and many bacteria.  CT head was negative.  Hospitalists were called for admission.  PAST MEDICAL HISTORY:   Past Medical History:  Diagnosis Date  . Aortic stenosis   . Arthritis   . CHF (congestive heart failure) ([place])   . Fall at home 07/28/2015  . GERD (gastroesophageal reflux disease)   . H/O bladder infections   . Hypertension   . Neuropathy    lower extrmities    PAST SURGICAL HISTORY:   Past Surgical History:  Procedure Laterality Date  . ABDOMINAL HYSTERECTOMY    . CATARACT EXTRACTION    . COLON SURGERY    . ESOPHAGOGASTRODUODENOSCOPY N/A 05/22/2018   Procedure: ESOPHAGOGASTRODUODENOSCOPY (EGD);   Surgeon: Virgel Manifold, MD;  Location[facility] ENDOSCOPY;  Service: Endoscopy;  Laterality: N/A;  . EYE SURGERY    . FOOT SURGERY Right   . ROTATOR CUFF REPAIR Left   . TONSILLECTOMY      SOCIAL HISTORY:   Social History   Tobacco Use  . Smoking status: Never Smoker  . Smokeless tobacco: Never Used  Substance Use Topics  . Alcohol use: No    FAMILY HISTORY:   Family History  Problem Relation Age of Onset  . Hypertension Father   . CAD Mother     DRUG ALLERGIES:   Allergies  Allergen Reactions  . Ciprofloxacin Itching and Rash  . Codeine Other (See Comments)    Upset stomach  . Sulfamethoxazole-Trimethoprim Rash    REVIEW OF SYSTEMS:   ROS-unable to obtain due to altered mental status  MEDICATIONS AT HOME:   Prior to Admission medications   Medication Sig Start Date End Date Taking? Authorizing Provider  acetaminophen (TYLENOL) 325 MG tablet Take 2 tablets (650 mg total) by mouth every 6 (six) hours as needed for mild pain (or Fever >/= 101). 05/26/18  Yes Gouru, Aruna, MD  albuterol (VENTOLIN HFA) 108 (90 Base) MCG/ACT inhaler Inhale 2 puffs into the lungs every 6 (six) hours as needed for wheezing or shortness of breath. 03/28/19  Yes Gouru, Illene Silver, MD  amiodarone (PACERONE) 200 MG tablet Take 0.5 tablets (100 mg total) by  mouth daily. 04/09/19  Yes Wieting, Richard, MD  aspirin EC 81 MG tablet Take 81 mg by mouth daily.   Yes [provider]  ferrous sulfate 325 (65 FE) MG tablet Take 325 mg by mouth daily.   Yes [provider]  fexofenadine (ALLEGRA) 60 MG tablet Take 60 mg by mouth daily. Pt daughter said pt takes this as needed, nurse home doing it daily   Yes [provider]  furosemide (LASIX) 20 MG tablet Take 20 mg by mouth 2 (two) times daily.   Yes [provider]  gabapentin (NEURONTIN) 300 MG capsule Take 300 mg by mouth 3 (three) times daily.   Yes [provider]  metoprolol tartrate (LOPRESSOR) 25 MG  tablet Take 1 tablet (25 mg total) by mouth 2 (two) times daily. 05/26/18  Yes Gouru, Illene Silver, MD  pantoprazole (PROTONIX) 40 MG tablet Take 1 tablet (40 mg total) by mouth daily. 04/09/19  Yes Wieting, Richard, MD  polyethylene glycol (MIRALAX / GLYCOLAX) 17 g packet Take 17 g by mouth daily as needed for severe constipation. 04/09/19  Yes Wieting, Richard, MD  spironolactone (ALDACTONE) 25 MG tablet Take 12.5 mg by mouth daily.   Yes [provider]      VITAL SIGNS:  Blood pressure 138/68, pulse 67, temperature 97.8 F (36.6 C), resp. rate (!) 21, height 5\' 1"  (1.549 m), weight 67.5 kg, SpO2 98 %.  PHYSICAL EXAMINATION:  Physical Exam  GENERAL:  85 y.o.-year-old patient lying in the bed with no acute distress.  EYES: Pupils equal, round, reactive to light and accommodation. No scleral icterus. Extraocular muscles intact.  HEENT: Head atraumatic, normocephalic. Oropharynx and nasopharynx clear.  NECK:  Supple, no jugular venous distention. No thyroid enlargement, no tenderness.  LUNGS: + Diminished breath sounds in the lung bases bilaterally, no wheezing, rales,rhonchi or crepitation. No use of accessory muscles of respiration.  CARDIOVASCULAR: RRR, S1, S2 normal. No rubs, or gallops. + Loud systolic murmur present. ABDOMEN: Soft, nontender, nondistended. Bowel sounds present. No organomegaly or mass.  EXTREMITIES: No pedal edema, cyanosis, or clubbing.  NEUROLOGIC: Cranial nerves II through XII are intact. + Global weakness. Sensation intact. Gait not checked.  PSYCHIATRIC: The patient is alert and oriented x 1.  Talking about ghosts in the room. SKIN: No obvious rash, lesion, or ulcer.   LABORATORY PANEL:   CBC Recent Labs  Lab 05/02/19 1045  WBC 5.0  HGB 14.0  HCT 45.0  PLT 72*   ------------------------------------------------------------------------------------------------------------------  Chemistries  Recent Labs  Lab 05/02/19 1045  NA 138  K 3.7  CL 100   CO2 29  GLUCOSE 163*  BUN 15  CREATININE 0.82  CALCIUM 9.2   ------------------------------------------------------------------------------------------------------------------  Cardiac Enzymes No results for input(s): TROPONINI in the last 168 hours. ------------------------------------------------------------------------------------------------------------------  RADIOLOGY:  Ct Head Wo Contrast  Result Date: 05/02/2019 CLINICAL DATA:  Confusion, altered mental status, possible UTI. EXAM: CT HEAD WITHOUT CONTRAST TECHNIQUE: Contiguous axial images were obtained from the base of the skull through the vertex without intravenous contrast. COMPARISON:  Head CT dated 12/22/2016. FINDINGS: Brain: Mild generalized age related parenchymal volume loss with commensurate dilatation of the ventricles and sulci. Mild chronic small vessel ischemic changes within the deep periventricular white matter regions bilaterally. No mass, hemorrhage, edema or other evidence of acute parenchymal abnormality. No extra-axial hemorrhage. Vascular: Chronic calcified atherosclerotic changes of the large vessels at the skull base. No unexpected hyperdense vessel. Skull: Normal. Negative for fracture or focal lesion. Sinuses/Orbits: No acute  finding. Other: None. IMPRESSION: 1. No acute findings. No intracranial mass, hemorrhage or edema. 2. Atrophy and chronic small vessel ischemic changes in the white matter. Electronically Signed   By: Franki Cabot M.D.   On: 05/02/2019 15:19      IMPRESSION AND PLAN:   Altered mental status/visual hallucinations- reportedly started after patient received a "sleeping pill" 2 nights ago.  No signs of infection. -Seen by psych, who recommended refraining from medications at this time -CT head negative -Will check HIV, RPR, folate, B12, ammonia, UDS, and TSH to rule out other causes -Avoid meds that can precipitate AMS  History of recurrent ESBL UTIs- UA with small hemoglobin,  positive nitrites, and many bacteria.  Do not think this is a true UTI, as patient is not showing any signs of infection and is completely asymptomatic. -Will continue meropenem for now, but can likely stop when urine culture results -Check renal US  Chronic respiratory failure secondary to chronic systolic CHF and moderate-severe aortic stenosis- uses 2 L O2 at baseline. -Continue home Lasix, metoprolol, spironolactone  Paroxysmal atrial fibrillation- in NSR here -Continue amiodarone and metoprolol  All the records are reviewed and case discussed with ED provider. Management plans discussed with the patient, family and they are in agreement.  CODE STATUS: DNR  TOTAL TIME TAKING CARE OF THIS PATIENT: 45 minutes.    Berna Spare Mayo M.D on 05/02/2019 at 4:19 PM  Between 7am to 6pm - Pager - [phone]  After 6pm go to [email or web address] - Proofreader  [facility]  Office  [phone]  CC: Primary care physician; Idelle Crouch, MD   Note: This dictation was prepared with Dragon dictation along with smaller phrase technology. Any transcriptional errors that result from this process are unintentional.

## 2019-05-02 NOTE — Social Work (Signed)
TOC CM/SW received social work consult.   [place] consulted w/attending. Social worker is following.   Berenice Bouton, MSW, LCSW  [phone] 8am-6pm (weekends) or CSW ED # [phone]

## 2019-05-02 NOTE — ED Notes (Signed)
Granddaughter at bedside, updated her on patient's status, pending urinalysis. Granddaughter concerned about pt's return to [facility] as well.

## 2019-05-03 DIAGNOSIS — R410 Disorientation, unspecified: Secondary | ICD-10-CM | POA: Diagnosis not present

## 2019-05-03 DIAGNOSIS — N3000 Acute cystitis without hematuria: Secondary | ICD-10-CM | POA: Diagnosis not present

## 2019-05-03 DIAGNOSIS — L8990 Pressure ulcer of unspecified site, unspecified stage: Secondary | ICD-10-CM | POA: Insufficient documentation

## 2019-05-03 LAB — CBC
HCT: 42.9 % (ref 36.0–46.0)
Hemoglobin: 13.4 g/dL (ref 12.0–15.0)
MCH: 26.3 pg (ref 26.0–34.0)
MCHC: 31.2 g/dL (ref 30.0–36.0)
MCV: 84.1 fL (ref 80.0–100.0)
Platelets: 67 10*3/uL — ABNORMAL LOW (ref 150–400)
RBC: 5.1 MIL/uL (ref 3.87–5.11)
RDW: 15 % (ref 11.5–15.5)
WBC: 5.3 10*3/uL (ref 4.0–10.5)
nRBC: 0 % (ref 0.0–0.2)

## 2019-05-03 LAB — BASIC METABOLIC PANEL
Anion gap: 14 (ref 5–15)
BUN: 16 mg/dL (ref 8–23)
CO2: 29 mmol/L (ref 22–32)
Calcium: 8.9 mg/dL (ref 8.9–10.3)
Chloride: 93 mmol/L — ABNORMAL LOW (ref 98–111)
Creatinine, Ser: 0.59 mg/dL (ref 0.44–1.00)
GFR calc Af Amer: >60 mL/min (ref >60)
GFR calc non Af Amer: >60 mL/min (ref >60)
Glucose, Bld: 138 mg/dL — ABNORMAL HIGH (ref 70–99)
Potassium: 4 mmol/L (ref 3.5–5.1)
Sodium: 136 mmol/L (ref 135–145)

## 2019-05-03 LAB — VITAMIN B12: Vitamin B-12: 1026 pg/mL — ABNORMAL HIGH (ref 180–914)

## 2019-05-03 MED ORDER — AMOXICILLIN 500 MG PO CAPS: 500.0000 mg | ORAL_CAPSULE | Freq: Three times a day (TID) | ORAL | 0 refills | Status: AC

## 2019-05-03 MED ORDER — AMOXICILLIN 500 MG PO CAPS
500.0000 mg | ORAL_CAPSULE | Freq: Three times a day (TID) | ORAL | Status: DC
  Filled 2019-05-03 (×2): qty 1

## 2019-05-03 NOTE — Progress Notes (Signed)
Pt alert and oriented. Denies hallucinations. Handoff report called to [place] at [facility].

## 2019-05-03 NOTE — TOC Progression Note (Addendum)
Transition of Care [facility]) - Progression Note    Patient Details  Name: BAYLER NEHRING MRN: [id number] Date of Birth: 1925-05-04  [facility] of [facility]) CM/SW Contact  Su Hilt, RN Phone Number: 05/03/2019, 9:55 AM  Clinical Narrative:    [facility] will take the patient back at Discharge room 507, call report to [phone]       Expected Discharge Plan and Services           Expected Discharge Date: 05/04/19                                     Social Determinants of Health (SDOH) Interventions    Readmission Risk Interventions Readmission Risk Prevention Plan 04/10/2019 04/09/2019  Post Dischage Appt Complete Complete  Medication Screening Complete Complete  Transportation Screening Complete Complete  Some recent data might be hidden

## 2019-05-03 NOTE — Discharge Summary (Signed)
[facility] NAME: Jessica Blackwell    MR#:  [id number]  DATE OF BIRTH:  1925/01/06  DATE OF ADMISSION:  05/02/2019 ADMITTING PHYSICIAN: Sela Hua, MD  DATE OF DISCHARGE: 05/03/2019  PRIMARY CARE PHYSICIAN: Idelle Crouch, MD    ADMISSION DIAGNOSIS:  Delirium [R41.0] Visual hallucinations [R44.1] Acute cystitis without hematuria [N30.00]  DISCHARGE DIAGNOSIS:  Active Problems:   Delirium due to another medical condition   Visual hallucinations   Pressure injury of skin   SECONDARY DIAGNOSIS:   Past Medical History:  Diagnosis Date  . Aortic stenosis   . Arthritis   . CHF (congestive heart failure) ([place])   . Fall at home 07/28/2015  . GERD (gastroesophageal reflux disease)   . H/O bladder infections   . Hypertension   . Neuropathy    lower extrmities    HOSPITAL COURSE:   85 year old female with hx of aortic stenosis and chronic combined systolic and diastolic heart failure with ejection fraction 45 to 50% who presented from [facility] due to visual hallucinations.  1.  Acute visual hallucinations: This is thought to be in part due to prior medications patient has been taking.  She also had UTI that was diagnosed about a week and a half ago and was not on the right antibiotics.  She will be discharged on oral amoxicillin as per sensitivities.  Her acute visual hallucinations have resolved. She was also evaluated by psychiatry while in the hospital. 2.  PAF: Patient is in normal sinus rhythm She will continue amiodarone and metoprolol  3.  Chronic hypoxic respiratory failure due to chronic combined systolic and diastolic heart failure: Patient will continue on oxygen Continue outpatient medications including Lasix, metoprolol and Aldactone      DISCHARGE CONDITIONS AND DIET:   Stable for discharge heart healthy regular diet  CONSULTS OBTAINED:    DRUG ALLERGIES:   Allergies  Allergen Reactions  .  Ciprofloxacin Itching and Rash  . Codeine Other (See Comments)    Upset stomach  . Sulfamethoxazole-Trimethoprim Rash    DISCHARGE MEDICATIONS:   Allergies as of 05/03/2019      Reactions   Ciprofloxacin Itching, Rash   Codeine Other (See Comments)   Upset stomach   Sulfamethoxazole-trimethoprim Rash      Medication List    TAKE these medications   acetaminophen 325 MG tablet Commonly known as: TYLENOL Take 2 tablets (650 mg total) by mouth every 6 (six) hours as needed for mild pain (or Fever >/= 101).   albuterol 108 (90 Base) MCG/ACT inhaler Commonly known as: VENTOLIN HFA Inhale 2 puffs into the lungs every 6 (six) hours as needed for wheezing or shortness of breath.   amiodarone 200 MG tablet Commonly known as: PACERONE Take 0.5 tablets (100 mg total) by mouth daily.   amoxicillin 500 MG capsule Commonly known as: AMOXIL Take 1 capsule (500 mg total) by mouth every 8 (eight) hours for 8 days.   aspirin EC 81 MG tablet Take 81 mg by mouth daily.   ferrous sulfate 325 (65 FE) MG tablet Take 325 mg by mouth daily.   fexofenadine 60 MG tablet Commonly known as: ALLEGRA Take 60 mg by mouth daily. Pt daughter said pt takes this as needed, nurse home doing it daily   furosemide 20 MG tablet Commonly known as: LASIX Take 20 mg by mouth 2 (two) times daily.   gabapentin 300 MG capsule Commonly known as: NEURONTIN Take 300  mg by mouth 3 (three) times daily.   metoprolol tartrate 25 MG tablet Commonly known as: LOPRESSOR Take 1 tablet (25 mg total) by mouth 2 (two) times daily.   pantoprazole 40 MG tablet Commonly known as: PROTONIX Take 1 tablet (40 mg total) by mouth daily.   polyethylene glycol 17 g packet Commonly known as: MIRALAX / GLYCOLAX Take 17 g by mouth daily as needed for severe constipation.   spironolactone 25 MG tablet Commonly known as: ALDACTONE Take 12.5 mg by mouth daily.         Today   CHIEF COMPLAINT:  No acute visual  hallucinations.  Patient is doing well this morning.`   VITAL SIGNS:  Blood pressure 129/63, pulse 64, temperature 98.4 F (36.9 C), resp. rate 14, height 5\' 1"  (1.549 m), weight 67.5 kg, SpO2 96 %.   REVIEW OF SYSTEMS:  Review of Systems  Constitutional: Negative.  Negative for chills, fever and malaise/fatigue.  HENT: Negative.  Negative for ear discharge, ear pain, hearing loss, nosebleeds and sore throat.   Eyes: Negative.  Negative for blurred vision and pain.  Respiratory: Negative.  Negative for cough, hemoptysis, shortness of breath and wheezing.   Cardiovascular: Negative.  Negative for chest pain, palpitations and leg swelling.  Gastrointestinal: Negative.  Negative for abdominal pain, blood in stool, diarrhea, nausea and vomiting.  Genitourinary: Negative.  Negative for dysuria.  Musculoskeletal: Negative.  Negative for back pain.  Skin: Negative.   Neurological: Negative for dizziness, tremors, speech change, focal weakness, seizures and headaches.  Endo/Heme/Allergies: Negative.  Does not bruise/bleed easily.  Psychiatric/Behavioral: Negative.  Negative for depression, hallucinations and suicidal ideas.     PHYSICAL EXAMINATION:  GENERAL:  85 y.o.-year-old patient lying in the bed with no acute distress.  NECK:  Supple, no jugular venous distention. No thyroid enlargement, no tenderness.  LUNGS: Normal breath sounds bilaterally, no wheezing, rales,rhonchi  No use of accessory muscles of respiration.  CARDIOVASCULAR: S1, S2 normal. 3/6 murmurs,no rubs, or gallops.  ABDOMEN: Soft, non-tender, non-distended. Bowel sounds present. No organomegaly or mass.  EXTREMITIES: No pedal edema, cyanosis, or clubbing.  PSYCHIATRIC: The patient is alert and oriented x 3.  SKIN: No obvious rash, lesion, or ulcer.   DATA REVIEW:   CBC Recent Labs  Lab 05/03/19 0433  WBC 5.3  HGB 13.4  HCT 42.9  PLT 67*    Chemistries  Recent Labs  Lab 05/03/19 0433  NA 136  K 4.0  CL  93*  CO2 29  GLUCOSE 138*  BUN 16  CREATININE 0.59  CALCIUM 8.9    Cardiac Enzymes No results for input(s): TROPONINI in the last 168 hours.  Microbiology Results  @MICRORSLT48 @  RADIOLOGY:  Ct Head Wo Contrast  Result Date: 05/02/2019 CLINICAL DATA:  Confusion, altered mental status, possible UTI. EXAM: CT HEAD WITHOUT CONTRAST TECHNIQUE: Contiguous axial images were obtained from the base of the skull through the vertex without intravenous contrast. COMPARISON:  Head CT dated 12/22/2016. FINDINGS: Brain: Mild generalized age related parenchymal volume loss with commensurate dilatation of the ventricles and sulci. Mild chronic small vessel ischemic changes within the deep periventricular white matter regions bilaterally. No mass, hemorrhage, edema or other evidence of acute parenchymal abnormality. No extra-axial hemorrhage. Vascular: Chronic calcified atherosclerotic changes of the large vessels at the skull base. No unexpected hyperdense vessel. Skull: Normal. Negative for fracture or focal lesion. Sinuses/Orbits: No acute finding. Other: None. IMPRESSION: 1. No acute findings. No intracranial mass, hemorrhage or edema. 2. Atrophy and  chronic small vessel ischemic changes in the white matter. Electronically Signed   By: Franki Cabot M.D.   On: 05/02/2019 15:19      Allergies as of 05/03/2019      Reactions   Ciprofloxacin Itching, Rash   Codeine Other (See Comments)   Upset stomach   Sulfamethoxazole-trimethoprim Rash      Medication List    TAKE these medications   acetaminophen 325 MG tablet Commonly known as: TYLENOL Take 2 tablets (650 mg total) by mouth every 6 (six) hours as needed for mild pain (or Fever >/= 101).   albuterol 108 (90 Base) MCG/ACT inhaler Commonly known as: VENTOLIN HFA Inhale 2 puffs into the lungs every 6 (six) hours as needed for wheezing or shortness of breath.   amiodarone 200 MG tablet Commonly known as: PACERONE Take 0.5 tablets (100 mg  total) by mouth daily.   amoxicillin 500 MG capsule Commonly known as: AMOXIL Take 1 capsule (500 mg total) by mouth every 8 (eight) hours for 8 days.   aspirin EC 81 MG tablet Take 81 mg by mouth daily.   ferrous sulfate 325 (65 FE) MG tablet Take 325 mg by mouth daily.   fexofenadine 60 MG tablet Commonly known as: ALLEGRA Take 60 mg by mouth daily. Pt daughter said pt takes this as needed, nurse home doing it daily   furosemide 20 MG tablet Commonly known as: LASIX Take 20 mg by mouth 2 (two) times daily.   gabapentin 300 MG capsule Commonly known as: NEURONTIN Take 300 mg by mouth 3 (three) times daily.   metoprolol tartrate 25 MG tablet Commonly known as: LOPRESSOR Take 1 tablet (25 mg total) by mouth 2 (two) times daily.   pantoprazole 40 MG tablet Commonly known as: PROTONIX Take 1 tablet (40 mg total) by mouth daily.   polyethylene glycol 17 g packet Commonly known as: MIRALAX / GLYCOLAX Take 17 g by mouth daily as needed for severe constipation.   spironolactone 25 MG tablet Commonly known as: ALDACTONE Take 12.5 mg by mouth daily.          Management plans discussed with the patient and she is in agreement. Stable for discharge   Patient should follow up with pcp  CODE STATUS:     Code Status Orders  (From admission, onward)         Start     Ordered   05/02/19 1845  Do not attempt resuscitation (DNR)  Continuous    Question Answer Comment  In the event of cardiac or respiratory ARREST Do not call a "code blue"   In the event of cardiac or respiratory ARREST Do not perform Intubation, CPR, defibrillation or ACLS   In the event of cardiac or respiratory ARREST Use medication by any route, position, wound care, and other measures to relive pain and suffering. May use oxygen, suction and manual treatment of airway obstruction as needed for comfort.   Comments nurse may pronounce      05/02/19 1844        Code Status History    Date Active  Date Inactive Code Status Order ID Comments User Context   04/19/2019 1739 04/20/2019 2044 DNR [id number]  Fritzi Mandes, MD Inpatient   04/07/2019 0017 04/10/2019 1951 DNR [id number]  Mayer Camel, NP Inpatient   04/07/2019 0017 04/07/2019 0017 DNR [id number]  Mayer Camel, NP Inpatient   03/26/2019 1948 03/28/2019 1814 DNR [id number]  Loletha Grayer, MD ED   05/21/2018 2003  05/26/2018 2013 Full Code [id number]  Fritzi Mandes, MD Inpatient   05/20/2018 1418 05/21/2018 2003 DNR [id number]  Loletha Grayer, MD ED   12/22/2016 2052 12/23/2016 1512 Full Code [id number]  Quintella Baton, MD Inpatient   07/28/2015 0638 08/02/2015 1844 Full Code [id number]  Norval Morton, MD Inpatient   Advance Care Planning Activity    Advance Directive Documentation     Most Recent Value  Type of Advance Directive  Healthcare Power of Attorney  Pre-existing out of facility DNR order (yellow form or pink MOST form)  -  "MOST" Form in Place?  -      TOTAL TIME TAKING CARE OF THIS PATIENT: 39 minutes.    Note: This dictation was prepared with Dragon dictation along with smaller phrase technology. Any transcriptional errors that result from this process are unintentional.  Bettey Costa M.D on 05/03/2019 at 10:09 AM  Between 7am to 6pm - Pager - [phone] After 6pm go to [email or web address] - password [email or web address] [facility]  Office  [phone]  CC: Primary care physician; Idelle Crouch, MD

## 2019-05-03 NOTE — Progress Notes (Signed)
Pt is alert and oriented. Still having visual hallucinations. Pt still saying that she saw men and children running around in her room. Pt stating that she is aware that these people probably don't exist. No other complaints this night.

## 2019-05-03 NOTE — Progress Notes (Signed)
Handoff report given to EMS personnel. Pt discharged back to [facility] via EMS.

## 2019-05-03 NOTE — NC FL2 (Signed)
[place] LEVEL OF CARE SCREENING TOOL     IDENTIFICATION  Patient Name: Jessica Blackwell Birthdate: [birth year] Sex: female Admission Date (Current Location): 05/02/2019  [place] and [place] Number:  Engineering geologist and Address:  [facility], [address], [place], [place] [address]      Provider Number: [id number]  Attending Physician Name and Address:  Bettey Costa, MD  Relative Name and Phone Number:  Winifred Olive Daughter [phone]    Current Level of Care: Hospital Recommended Level of Care: [place] Prior Approval Number:    Date Approved/Denied:   PASRR Number: [id number]  Discharge Plan: SNF    Current Diagnoses: Patient Active Problem List   Diagnosis Date Noted  . Pressure injury of skin 05/03/2019  . Delirium due to another medical condition 05/02/2019  . Visual hallucinations   . UTI (urinary tract infection) 04/06/2019  . CHF (congestive heart failure) ([place]) 03/26/2019  . Non-intractable vomiting with nausea   . LOC (loss of consciousness) ([place]) 12/22/2016  . AKI (acute kidney injury) ([place]) 08/01/2015  . V-tach ([place]) 08/01/2015  . NSVT (nonsustained ventricular tachycardia) ([place])   . Persistent atrial fibrillation   . Acute systolic congestive heart failure ([place]) 07/31/2015  . Acute respiratory failure with hypoxia ([place]) 07/28/2015  . Fall at home 07/28/2015  . GERD (gastroesophageal reflux disease) 07/28/2015  . HTN (hypertension) 07/28/2015  . Anemia 07/28/2015    Orientation RESPIRATION BLADDER Height & Weight     Self, Place  Normal Continent Weight: 67.5 kg Height:  5\' 1"  (154.9 cm)  BEHAVIORAL SYMPTOMS/MOOD NEUROLOGICAL BOWEL NUTRITION STATUS      Continent    AMBULATORY STATUS COMMUNICATION OF NEEDS Skin   Extensive Assist Verbally Normal                       Personal Care Assistance Level of Assistance  Bathing, Dressing Bathing Assistance: Limited  assistance Feeding assistance: Limited assistance Dressing Assistance: Limited assistance Total Care Assistance: Limited assistance   Functional Limitations Info  Sight Sight Info: Adequate Hearing Info: Adequate Speech Info: Adequate    SPECIAL CARE FACTORS FREQUENCY  PT (By licensed PT)     PT Frequency: 5 times a week              Contractures Contractures Info: Not present    Additional Factors Info  Code Status, Allergies Code Status Info: DNR Allergies Info: Cipro, Sulfa, Codeine           Current Medications (05/03/2019):  This is the current hospital active medication list Current Facility-Administered Medications  Medication Dose Route Frequency Provider Last Rate Last Dose  . acetaminophen (TYLENOL) tablet 650 mg  650 mg Oral Q6H PRN Mayo, Pete Pelt, MD       Or  . acetaminophen (TYLENOL) suppository 650 mg  650 mg Rectal Q6H PRN Mayo, Pete Pelt, MD      . albuterol (PROVENTIL) (2.5 MG/3ML) 0.083% nebulizer solution 3 mL  3 mL Inhalation Q6H PRN Mayo, Pete Pelt, MD      . amiodarone (PACERONE) tablet 100 mg  100 mg Oral Daily Mayo, Pete Pelt, MD   100 mg at 05/03/19 0851  . amoxicillin (AMOXIL) capsule 500 mg  500 mg Oral Q8H Mody, Sital, MD      . aspirin EC tablet 81 mg  81 mg Oral Daily Mayo, Pete Pelt, MD   81 mg at 05/03/19 0851  . enoxaparin (LOVENOX) injection  40 mg  40 mg Subcutaneous Q24H Mayo, Pete Pelt, MD      . ferrous sulfate tablet 325 mg  325 mg Oral Daily Mayo, Pete Pelt, MD      . furosemide (LASIX) tablet 20 mg  20 mg Oral BID Sela Hua, MD   20 mg at 05/03/19 0855  . gabapentin (NEURONTIN) capsule 300 mg  300 mg Oral TID Sela Hua, MD   300 mg at 05/03/19 0853  . loratadine (CLARITIN) tablet 10 mg  10 mg Oral Daily Mayo, Pete Pelt, MD   10 mg at 05/03/19 0853  . metoprolol tartrate (LOPRESSOR) tablet 25 mg  25 mg Oral BID Sela Hua, MD   25 mg at 05/03/19 0850  . ondansetron (ZOFRAN) tablet 4 mg  4 mg Oral Q6H PRN Mayo,  Pete Pelt, MD       Or  . ondansetron [facility]) injection 4 mg  4 mg Intravenous Q6H PRN Mayo, Pete Pelt, MD      . pantoprazole (PROTONIX) EC tablet 40 mg  40 mg Oral Daily Mayo, Pete Pelt, MD   40 mg at 05/03/19 0850  . polyethylene glycol (MIRALAX / GLYCOLAX) packet 17 g  17 g Oral Daily PRN Mayo, Pete Pelt, MD      . spironolactone (ALDACTONE) tablet 12.5 mg  12.5 mg Oral Daily Mayo, Pete Pelt, MD   12.5 mg at 05/03/19 6144     Discharge Medications: Please see discharge summary for a list of discharge medications.  Relevant Imaging Results:  Relevant Lab Results:   Additional Information [id number]  Su Hilt, RN

## 2019-05-03 NOTE — TOC Transition Note (Signed)
Transition of Care [facility]) - CM/SW Discharge Note   Patient Details  Name: Jessica Blackwell MRN: [id number] Date of Birth: 06/13/1925  [facility] CM/SW Contact:  Su Hilt, RN Phone Number: 05/03/2019, 11:13 AM   Clinical Narrative:    Patient to discharge back to [facility] via EMS today to room 507 Nurse to call report to [phone] Daughter Otho Perl made aware   Final next level of care: Skilled Nursing Facility Barriers to Discharge: Barriers Resolved   Patient Goals and CMS Choice        Discharge Placement                       Discharge Plan and Services                                     Social Determinants of Health (SDOH) Interventions     Readmission Risk Interventions Readmission Risk Prevention Plan 04/10/2019 04/09/2019  Post Dischage Appt Complete Complete  Medication Screening Complete Complete  Transportation Screening Complete Complete  Some recent data might be hidden

## 2019-05-03 NOTE — Progress Notes (Signed)
PT Cancellation Note  Patient Details Name: Jessica Blackwell MRN: [id number] DOB: Jan 01, 1925   Cancelled Treatment:    Reason Eval/Treat Not Completed: Medical issues which prohibited therapy.  Per chart review pt not in NSR and initiated on amiodarone 05/03/19 at 8:51 am.  Per protocol will hold PT services for 24 hrs from initiation of medication and then will attempt to see pt assuming pt in NSR with HR controlled.       Linus Salmons PT, DPT 05/03/19, 9:13 AM

## 2019-05-03 NOTE — Consult Note (Addendum)
Pharmacy Antibiotic Note  Jessica Blackwell is a 85 y.o. female admitted on 05/02/2019 with UTI with history of ESBL. Most recent culture grew 04/19/19 ENTEROCOCCUS FAECALIS (>20,000 CFU/mL) and was sensitive to ampicillin. Per Dr. Benjie Karvonen, if last cultures were sensitive then it is okay to use amoxicillin. Patient presented with complaints of hallucinations though patient is not showing any signs of infection and is completely asymptomatic otherwise.  Pharmacy has been consulted for Amoxicillin dosing. WBC 5.3>> 5.0 >> 5.3 Afebrile past 24 hours. BP WNL for age (one-time spike in BP, but was rechecked and was 143/82 mmHg). UA- leukocytes neg, nitrite pos, cast present; few RBC   Allergies: Ciprofloxacin- patient is a poor historian; when asked about antibiotic allergies she reported that she doesn't have allergies to antibiotics. Per chart review, patient has never had ciprofloxacin/levofloxacin.   Plan: Will order amoxicillin 500 mg Q8H and follow up on utility of amoxicillin - pending sensitivies.   Height: 5\' 1"  (154.9 cm) Weight: 148 lb 13 oz (67.5 kg) IBW/kg (Calculated) : 47.8  Temp (24hrs), Avg:98 F (36.7 C), Min:97.8 F (36.6 C), Max:98.4 F (36.9 C)  Recent Labs  Lab 05/02/19 1045 05/03/19 0433  WBC 5.0 5.3  CREATININE 0.82 0.59    Estimated Creatinine Clearance: 39.5 mL/min (by C-G formula based on SCr of 0.59 mg/dL).    Allergies  Allergen Reactions  . Ciprofloxacin Itching and Rash  . Codeine Other (See Comments)    Upset stomach  . Sulfamethoxazole-Trimethoprim Rash    Antimicrobials this admission: 8/16 Meropenem >> 8/17 8/17 Amoxicillin >>   Dose adjustments this admission: None  Microbiology results: 8/16 UCx: pending   Thank you for allowing pharmacy to be a part of this patient's care.  Rowland Lathe, PharmD Clinical Pharmacist 05/03/2019 9:29 AM

## 2019-05-03 NOTE — TOC Progression Note (Signed)
Transition of Care [facility]) - Progression Note    Patient Details  Name: Jessica Blackwell MRN: [id number] Date of Birth: May 09, 1925  Transition of Care [facility]) CM/SW Contact  Su Hilt, RN Phone Number: 05/03/2019, 9:48 AM  Clinical Narrative:     Damaris Schooner to Magda Paganini at [facility], Sent the information and referral thru the Hub, Magda Paganini will take a look and call me back, this patient was in the rehab side of [place] previously       Expected Discharge Plan and Services           Expected Discharge Date: 05/04/19                                     Social Determinants of Health (SDOH) Interventions    Readmission Risk Interventions Readmission Risk Prevention Plan 04/10/2019 04/09/2019  Post Dischage Appt Complete Complete  Medication Screening Complete Complete  Transportation Screening Complete Complete  Some recent data might be hidden

## 2019-05-04 LAB — HIV ANTIBODY (ROUTINE TESTING W REFLEX): HIV Screen 4th Generation wRfx: NONREACTIVE

## 2019-05-04 LAB — RPR: RPR Ser Ql: NONREACTIVE

## 2019-05-06 ENCOUNTER — Ambulatory Visit
Admission: RE | Admit: 2019-05-06 | Discharge: 2019-05-06 | Disposition: A | Discharge status: Home / Self Care | Payer: Medicare Other | Source: Ambulatory Visit | Attending: Internal Medicine | Admitting: Internal Medicine

## 2019-05-06 ENCOUNTER — Other Ambulatory Visit: Payer: Self-pay | Admitting: Internal Medicine

## 2019-05-06 ENCOUNTER — Other Ambulatory Visit: Payer: Self-pay

## 2019-05-06 ENCOUNTER — Other Ambulatory Visit (HOSPITAL_COMMUNITY): Payer: Self-pay | Admitting: Internal Medicine

## 2019-05-06 DIAGNOSIS — R7989 Other specified abnormal findings of blood chemistry: Secondary | ICD-10-CM | POA: Insufficient documentation

## 2019-05-06 DIAGNOSIS — R0902 Hypoxemia: Secondary | ICD-10-CM

## 2019-05-06 LAB — URINE CULTURE: Culture: >=100000 — AB

## 2019-05-06 MED ORDER — IOHEXOL 350 MG/ML SOLN
75.0000 mL | Freq: Once | INTRAVENOUS | Status: AC | PRN
  Administered 2019-05-06: 75 mL via INTRAVENOUS

## 2019-05-17 ENCOUNTER — Ambulatory Visit: Payer: Medicare Other

## 2019-07-23 ENCOUNTER — Inpatient Hospital Stay: Payer: Medicare Other | Admitting: Oncology

## 2019-07-30 ENCOUNTER — Other Ambulatory Visit: Payer: Self-pay

## 2019-07-30 DIAGNOSIS — D61818 Other pancytopenia: Secondary | ICD-10-CM | POA: Insufficient documentation

## 2019-07-30 NOTE — Progress Notes (Signed)
[facility]  [phone] [phone]  ID: Jessica Blackwell OB: September 01, 1925  MR#: [id number]  WOE#:[id number]  Patient Care Team: Idelle Crouch, MD as PCP - General (Internal Medicine)  CHIEF COMPLAINT: Pancytopenia.  INTERVAL HISTORY: Patient is a 85 year old female with a longstanding history of thrombocytopenia who was referred to clinic for further evaluation of worsening pancytopenia.  She currently feels well and is asymptomatic.  She has no neurologic complaints.  She denies any recent fevers or illnesses.  She has no new medications.  She denies any easy bleeding or bruising.  She has good appetite and denies weight loss.  She has no chest pain, shortness of breath, cough, or hemoptysis.  She denies any nausea, vomiting, constipation, or diarrhea.  She has no urinary complaints.  Patient feels at her baseline offers no specific complaints today.  REVIEW OF SYSTEMS:   Review of Systems  Constitutional: Negative.  Negative for fever, malaise/fatigue and weight loss.  Respiratory: Negative.  Negative for cough, hemoptysis and shortness of breath.   Cardiovascular: Negative.  Negative for chest pain and leg swelling.  Gastrointestinal: Negative.  Negative for abdominal pain.  Genitourinary: Negative.  Negative for dysuria.  Musculoskeletal: Negative.  Negative for back pain.  Skin: Negative.  Negative for rash.  Neurological: Negative.  Negative for dizziness, focal weakness, weakness and headaches.  Endo/Heme/Allergies: Does not bruise/bleed easily.  Psychiatric/Behavioral: Negative.  The patient is not nervous/anxious.     As per HPI. Otherwise, a complete review of systems is negative.  PAST MEDICAL HISTORY: Past Medical History:  Diagnosis Date  . Aortic stenosis   . Arthritis   . CHF (congestive heart failure) ([place])   . Fall at home 07/28/2015  . GERD (gastroesophageal reflux disease)   . H/O bladder infections   . Hypertension   .  Neuropathy    lower extrmities    PAST SURGICAL HISTORY: Past Surgical History:  Procedure Laterality Date  . ABDOMINAL HYSTERECTOMY    . CATARACT EXTRACTION    . COLON SURGERY    . ESOPHAGOGASTRODUODENOSCOPY N/A 05/22/2018   Procedure: ESOPHAGOGASTRODUODENOSCOPY (EGD);  Surgeon: Virgel Manifold, MD;  Location: [facility] ENDOSCOPY;  Service: Endoscopy;  Laterality: N/A;  . EYE SURGERY    . FOOT SURGERY Right   . ROTATOR CUFF REPAIR Left   . TONSILLECTOMY      FAMILY HISTORY: Family History  Problem Relation Age of Onset  . Hypertension Father   . CAD Mother     ADVANCED DIRECTIVES (Y/N):  N  HEALTH MAINTENANCE: Social History   Tobacco Use  . Smoking status: Never Smoker  . Smokeless tobacco: Never Used  Substance Use Topics  . Alcohol use: No  . Drug use: No     Colonoscopy:  PAP:  Bone density:  Lipid panel:  Allergies  Allergen Reactions  . Ciprofloxacin Itching and Rash  . Codeine Other (See Comments)    Upset stomach  . Sulfamethoxazole-Trimethoprim Rash    Current Outpatient Medications  Medication Sig Dispense Refill  . acetaminophen (TYLENOL) 325 MG tablet Take 2 tablets (650 mg total) by mouth every 6 (six) hours as needed for mild pain (or Fever >/= 101).    Marland Kitchen amiodarone (PACERONE) 200 MG tablet Take 0.5 tablets (100 mg total) by mouth daily.    Marland Kitchen aspirin EC 81 MG tablet Take 81 mg by mouth daily.    . cefUROXime (CEFTIN) 250 MG tablet Take by mouth.    . furosemide (LASIX) 20 MG  tablet Take 20 mg by mouth 2 (two) times daily.    Marland Kitchen gabapentin (NEURONTIN) 300 MG capsule Take 300 mg by mouth 3 (three) times daily.    . metoprolol tartrate (LOPRESSOR) 25 MG tablet Take 1 tablet (25 mg total) by mouth 2 (two) times daily.    . pantoprazole (PROTONIX) 40 MG tablet Take 1 tablet (40 mg total) by mouth daily.    . polyethylene glycol (MIRALAX / GLYCOLAX) 17 g packet Take 17 g by mouth daily as needed for severe constipation. 14 each 0  . spironolactone  (ALDACTONE) 25 MG tablet Take 12.5 mg by mouth daily.    Marland Kitchen albuterol (VENTOLIN HFA) 108 (90 Base) MCG/ACT inhaler Inhale 2 puffs into the lungs every 6 (six) hours as needed for wheezing or shortness of breath. (Patient not taking: Reported on 08/02/2019) 8 g 0  . ferrous sulfate 325 (65 FE) MG tablet Take 325 mg by mouth daily.    . fexofenadine (ALLEGRA) 60 MG tablet Take 60 mg by mouth daily. Pt daughter said pt takes this as needed, nurse home doing it daily     No current facility-administered medications for this visit.     OBJECTIVE: Vitals:   08/02/19 1528  BP: (!) 122/50  Pulse: 66  Resp: 16  Temp: 97.8 F (36.6 C)  SpO2: 93%     Body mass index is 28.34 kg/m.    ECOG FS:0 - Asymptomatic  General: Thin, no acute distress.  Sitting in a wheelchair. Eyes: Pink conjunctiva, anicteric sclera. HEENT: Normocephalic, moist mucous membranes, clear oropharnyx. Lungs: Clear to auscultation bilaterally. Heart: Regular rate and rhythm. No rubs, murmurs, or gallops. Abdomen: Soft, nontender, nondistended. No organomegaly noted, normoactive bowel sounds. Musculoskeletal: No edema, cyanosis, or clubbing. Neuro: Alert, answering all questions appropriately. Cranial nerves grossly intact. Skin: No rashes or petechiae noted. Psych: Normal affect. Lymphatics: No cervical, calvicular, axillary or inguinal LAD.   LAB RESULTS:  Lab Results  Component Value Date   NA 136 05/03/2019   K 4.0 05/03/2019   CL 93 (L) 05/03/2019   CO2 29 05/03/2019   GLUCOSE 138 (H) 05/03/2019   BUN 16 05/03/2019   CREATININE 0.59 05/03/2019   CALCIUM 8.9 05/03/2019   PROT 7.0 04/19/2019   ALBUMIN 4.1 04/19/2019   AST 17 04/19/2019   ALT 15 04/19/2019   ALKPHOS 68 04/19/2019   BILITOT 0.6 04/19/2019   GFRNONAA >60 05/03/2019   GFRAA >60 05/03/2019    Lab Results  Component Value Date   WBC 2.1 (L) 08/02/2019   NEUTROABS 2.3 03/26/2019   HGB 10.2 (L) 08/02/2019   HCT 32.6 (L) 08/02/2019   MCV  94.5 08/02/2019   PLT 41 (L) 08/02/2019     STUDIES: No results found.  ASSESSMENT: Pancytopenia.  PLAN:    1. Pancytopenia: Patient's white blood cell count, hemoglobin, platelet count are all reduced and have trended down since August 2020.  Iron stores, B12, folate, hemolysis lab are all within normal limits.  She has an inappropriately normal reticulocyte count.  Peripheral blood myeloid panel to assess for underlying MDS, platelet antibody profile, antineutrophil antibodies, SPEP, and flow cytometry are all pending at time of dictation.  Suspect underlying MDS, but this would take a bone marrow biopsy to diagnose which is not necessary at this time and patient is refusing.  Patient will have video assisted telemedicine visit in 3 weeks to discuss the results and any other diagnostic testing necessary.  Patient expressed understanding and was in  agreement with this plan. She also understands that She can call clinic at any time with any questions, concerns, or complaints.   I spent a total of 45 minutes face-to-face with the patient of which greater than 50% of the visit was spent in counseling and coordination of care as detailed above.   Lloyd Huger, MD   08/03/2019 6:46 AM

## 2019-08-02 ENCOUNTER — Inpatient Hospital Stay: Payer: Medicare Other | Attending: Oncology | Admitting: Oncology

## 2019-08-02 ENCOUNTER — Inpatient Hospital Stay: Payer: Medicare Other

## 2019-08-02 ENCOUNTER — Encounter: Payer: Self-pay | Admitting: Oncology

## 2019-08-02 ENCOUNTER — Other Ambulatory Visit: Payer: Self-pay

## 2019-08-02 ENCOUNTER — Encounter (INDEPENDENT_AMBULATORY_CARE_PROVIDER_SITE_OTHER): Payer: Self-pay

## 2019-08-02 DIAGNOSIS — D61818 Other pancytopenia: Secondary | ICD-10-CM

## 2019-08-02 LAB — CBC
HCT: 32.6 % — ABNORMAL LOW (ref 36.0–46.0)
Hemoglobin: 10.2 g/dL — ABNORMAL LOW (ref 12.0–15.0)
MCH: 29.6 pg (ref 26.0–34.0)
MCHC: 31.3 g/dL (ref 30.0–36.0)
MCV: 94.5 fL (ref 80.0–100.0)
Platelets: 41 10*3/uL — ABNORMAL LOW (ref 150–400)
RBC: 3.45 MIL/uL — ABNORMAL LOW (ref 3.87–5.11)
RDW: 17.3 % — ABNORMAL HIGH (ref 11.5–15.5)
WBC: 2.1 10*3/uL — ABNORMAL LOW (ref 4.0–10.5)
nRBC: 0 % (ref 0.0–0.2)

## 2019-08-02 LAB — IRON AND TIBC
Iron: 121 ug/dL (ref 28–170)
Saturation Ratios: 40 % — ABNORMAL HIGH (ref 10.4–31.8)
TIBC: 305 ug/dL (ref 250–450)
UIBC: 184 ug/dL

## 2019-08-02 LAB — FOLATE: Folate: 13 ng/mL (ref >5.9)

## 2019-08-02 LAB — RETICULOCYTES
Immature Retic Fract: 14.2 % (ref 2.3–15.9)
RBC.: 3.45 MIL/uL — ABNORMAL LOW (ref 3.87–5.11)
Retic Count, Absolute: 84.9 10*3/uL (ref 19.0–186.0)
Retic Ct Pct: 2.5 % (ref 0.4–3.1)

## 2019-08-02 LAB — LACTATE DEHYDROGENASE: LDH: 113 U/L (ref 98–192)

## 2019-08-02 LAB — VITAMIN B12: Vitamin B-12: 3780 pg/mL — ABNORMAL HIGH (ref 180–914)

## 2019-08-02 LAB — FERRITIN: Ferritin: 99 ng/mL (ref 11–307)

## 2019-08-03 LAB — PROTEIN ELECTROPHORESIS, SERUM
A/G Ratio: 1.4 (ref 0.7–1.7)
Albumin ELP: 3.9 g/dL (ref 2.9–4.4)
Alpha-1-Globulin: 0.2 g/dL (ref 0.0–0.4)
Alpha-2-Globulin: 0.8 g/dL (ref 0.4–1.0)
Beta Globulin: 1 g/dL (ref 0.7–1.3)
Gamma Globulin: 0.9 g/dL (ref 0.4–1.8)
Globulin, Total: 2.8 g/dL (ref 2.2–3.9)
Total Protein ELP: 6.7 g/dL (ref 6.0–8.5)

## 2019-08-03 LAB — HAPTOGLOBIN: Haptoglobin: 67 mg/dL (ref 41–333)

## 2019-08-04 LAB — COMP PANEL: LEUKEMIA/LYMPHOMA

## 2019-08-04 LAB — PLATELET ANTIBODY PROFILE
Glycoprotein IV Antibody: NEGATIVE
HLA Ab Ser Ql EIA: NEGATIVE
IA/IIA Antibody: NEGATIVE
IB/IX Antibody: NEGATIVE
IIB/IIIA Antibody: NEGATIVE

## 2019-08-05 ENCOUNTER — Telehealth: Payer: Self-pay | Admitting: *Deleted

## 2019-08-05 ENCOUNTER — Emergency Department
Admission: EM | Admit: 2019-08-05 | Discharge: 2019-08-05 | Disposition: A | Discharge status: Home / Self Care | Payer: Medicare Other | Attending: Emergency Medicine | Admitting: Emergency Medicine

## 2019-08-05 ENCOUNTER — Other Ambulatory Visit: Payer: Self-pay

## 2019-08-05 ENCOUNTER — Emergency Department: Payer: Medicare Other

## 2019-08-05 ENCOUNTER — Encounter: Payer: Self-pay | Admitting: Emergency Medicine

## 2019-08-05 DIAGNOSIS — Z79899 Other long term (current) drug therapy: Secondary | ICD-10-CM | POA: Insufficient documentation

## 2019-08-05 DIAGNOSIS — W07XXXA Fall from chair, initial encounter: Secondary | ICD-10-CM | POA: Diagnosis not present

## 2019-08-05 DIAGNOSIS — S0083XA Contusion of other part of head, initial encounter: Secondary | ICD-10-CM

## 2019-08-05 DIAGNOSIS — Y9389 Activity, other specified: Secondary | ICD-10-CM | POA: Diagnosis not present

## 2019-08-05 DIAGNOSIS — R519 Headache, unspecified: Secondary | ICD-10-CM | POA: Diagnosis not present

## 2019-08-05 DIAGNOSIS — I5022 Chronic systolic (congestive) heart failure: Secondary | ICD-10-CM | POA: Insufficient documentation

## 2019-08-05 DIAGNOSIS — W19XXXA Unspecified fall, initial encounter: Secondary | ICD-10-CM

## 2019-08-05 DIAGNOSIS — I110 Hypertensive heart disease with heart failure: Secondary | ICD-10-CM | POA: Insufficient documentation

## 2019-08-05 DIAGNOSIS — Z7982 Long term (current) use of aspirin: Secondary | ICD-10-CM | POA: Insufficient documentation

## 2019-08-05 DIAGNOSIS — S0011XA Contusion of right eyelid and periocular area, initial encounter: Secondary | ICD-10-CM | POA: Diagnosis present

## 2019-08-05 DIAGNOSIS — Y999 Unspecified external cause status: Secondary | ICD-10-CM | POA: Insufficient documentation

## 2019-08-05 DIAGNOSIS — Y929 Unspecified place or not applicable: Secondary | ICD-10-CM | POA: Insufficient documentation

## 2019-08-05 LAB — CBC WITH DIFFERENTIAL/PLATELET
Abs Immature Granulocytes: 0.01 10*3/uL (ref 0.00–0.07)
Basophils Absolute: 0 10*3/uL (ref 0.0–0.1)
Basophils Relative: 1 %
Eosinophils Absolute: 0 10*3/uL (ref 0.0–0.5)
Eosinophils Relative: 1 %
HCT: 35 % — ABNORMAL LOW (ref 36.0–46.0)
Hemoglobin: 11.1 g/dL — ABNORMAL LOW (ref 12.0–15.0)
Immature Granulocytes: 0 %
Lymphocytes Relative: 41 %
Lymphs Abs: 1.1 10*3/uL (ref 0.7–4.0)
MCH: 30.2 pg (ref 26.0–34.0)
MCHC: 31.7 g/dL (ref 30.0–36.0)
MCV: 95.1 fL (ref 80.0–100.0)
Monocytes Absolute: 0.1 10*3/uL (ref 0.1–1.0)
Monocytes Relative: 5 %
Neutro Abs: 1.4 10*3/uL — ABNORMAL LOW (ref 1.7–7.7)
Neutrophils Relative %: 52 %
Platelets: 40 10*3/uL — ABNORMAL LOW (ref 150–400)
RBC: 3.68 MIL/uL — ABNORMAL LOW (ref 3.87–5.11)
RDW: 17.2 % — ABNORMAL HIGH (ref 11.5–15.5)
WBC: 2.6 10*3/uL — ABNORMAL LOW (ref 4.0–10.5)
nRBC: 0 % (ref 0.0–0.2)

## 2019-08-05 LAB — BASIC METABOLIC PANEL
Anion gap: 10 (ref 5–15)
BUN: 17 mg/dL (ref 8–23)
CO2: 27 mmol/L (ref 22–32)
Calcium: 9.2 mg/dL (ref 8.9–10.3)
Chloride: 104 mmol/L (ref 98–111)
Creatinine, Ser: 0.77 mg/dL (ref 0.44–1.00)
GFR calc Af Amer: >60 mL/min (ref >60)
GFR calc non Af Amer: >60 mL/min (ref >60)
Glucose, Bld: 151 mg/dL — ABNORMAL HIGH (ref 70–99)
Potassium: 4.2 mmol/L (ref 3.5–5.1)
Sodium: 141 mmol/L (ref 135–145)

## 2019-08-05 NOTE — Telephone Encounter (Signed)
Pt reports going to sit in chair and unsure why she fell. Does not remember hitting head but remembers all events immediately before and after fall. States she has bruising, but denies lethargy, nausea, vomiting. Due to thrombocytopenia, advised pt to go to ER for evaluation.

## 2019-08-05 NOTE — Telephone Encounter (Signed)
Called and spoke with patient and advised her to go to ER to be evaluated as she does not remember hitting her head, but reports bruising to forehead and around eye.

## 2019-08-05 NOTE — ED Notes (Signed)
Pt refused to stand and ambulate. Grandson at bedside and states she is ok to go home and will walk. He says she is just nervous. MD informed.

## 2019-08-05 NOTE — Telephone Encounter (Signed)
Judson Roch is telling her to go to the ED.

## 2019-08-05 NOTE — ED Notes (Signed)
This RN spoke with Dr. Jacqualine Code regarding patient care. [facility] for blood work and imaging

## 2019-08-05 NOTE — ED Triage Notes (Signed)
Pt presents to ED via POV, per grandson pt fell last night. Pt with significant bruising to R eye. Pt states she has fallen multiple times before and doesn't know what makes her fall. Pt presents alert and oriented, pt states she has a hx of low platelets and her PCP wanted her to get checked out, pt on chronic 2L via [place].

## 2019-08-05 NOTE — ED Provider Notes (Addendum)
[facility] Emergency Department Provider Note  ____________________________________________   First MD Initiated Contact with Patient 08/05/19 1630     (approximate)  I have reviewed the triage vital signs and the nursing notes.   HISTORY  Chief Complaint Fall    HPI Jessica Blackwell is a 85 y.o. female with CHF, aortic stenosis, hypertension who presents with a fall.  Patient with fall last night.  Patient states that she was sitting down on a chair with a cushion on it and the cushion slipped down and so she fell down.  She does not remember what she hit her face on.  The fall occurred at 5 PM.  She has been ambulatory since then.  She followed up with her doctor today who recommended repeating CT scans due to her known low platelets.  She says otherwise she feels at her baseline.  Denies any chest pain, shortness of breath, urinary symptoms.  She is currently on antibiotics for UTI but denies any recurrent symptoms.  She denies any fevers.          Past Medical History:  Diagnosis Date   Aortic stenosis    Arthritis    CHF (congestive heart failure) ([place])    Fall at home 07/28/2015   GERD (gastroesophageal reflux disease)    H/O bladder infections    Hypertension    Neuropathy    lower extrmities    Patient Active Problem List   Diagnosis Date Noted   Pancytopenia ([place]) 07/30/2019   Pressure injury of skin 05/03/2019   Delirium due to another medical condition 05/02/2019   Visual hallucinations    UTI (urinary tract infection) 04/06/2019   CHF (congestive heart failure) ([place]) 03/26/2019   Non-intractable vomiting with nausea    LOC (loss of consciousness) ([place]) 12/22/2016   AKI (acute kidney injury) ([place]) 08/01/2015   V-tach ([place]) 08/01/2015   NSVT (nonsustained ventricular tachycardia) ([place])    Persistent atrial fibrillation ([place])    Acute systolic congestive heart failure ([place]) 07/31/2015   Acute respiratory  failure with hypoxia ([place]) 07/28/2015   Fall at home 07/28/2015   GERD (gastroesophageal reflux disease) 07/28/2015   HTN (hypertension) 07/28/2015   Anemia 07/28/2015    Past Surgical History:  Procedure Laterality Date   ABDOMINAL HYSTERECTOMY     CATARACT EXTRACTION     COLON SURGERY     ESOPHAGOGASTRODUODENOSCOPY N/A 05/22/2018   Procedure: ESOPHAGOGASTRODUODENOSCOPY (EGD);  Surgeon: Virgel Manifold, MD;  Location: [facility] ENDOSCOPY;  Service: Endoscopy;  Laterality: N/A;   EYE SURGERY     FOOT SURGERY Right    ROTATOR CUFF REPAIR Left    TONSILLECTOMY      Prior to Admission medications   Medication Sig Start Date End Date Taking? Authorizing Provider  acetaminophen (TYLENOL) 325 MG tablet Take 2 tablets (650 mg total) by mouth every 6 (six) hours as needed for mild pain (or Fever >/= 101). 05/26/18   Gouru, Illene Silver, MD  albuterol (VENTOLIN HFA) 108 (90 Base) MCG/ACT inhaler Inhale 2 puffs into the lungs every 6 (six) hours as needed for wheezing or shortness of breath. Patient not taking: Reported on 08/02/2019 03/28/19   Nicholes Mango, MD  amiodarone (PACERONE) 200 MG tablet Take 0.5 tablets (100 mg total) by mouth daily. 04/09/19   Loletha Grayer, MD  aspirin EC 81 MG tablet Take 81 mg by mouth daily.    [provider]  cefUROXime (CEFTIN) 250 MG tablet Take by mouth. 07/26/19 08/05/19  [provider]  ferrous sulfate 325 (65 FE) MG tablet Take 325 mg by mouth daily.    [provider]  fexofenadine (ALLEGRA) 60 MG tablet Take 60 mg by mouth daily. Pt daughter said pt takes this as needed, nurse home doing it daily    [provider]  furosemide (LASIX) 20 MG tablet Take 20 mg by mouth 2 (two) times daily.    [provider]  gabapentin (NEURONTIN) 300 MG capsule Take 300 mg by mouth 3 (three) times daily.    [provider]  metoprolol tartrate (LOPRESSOR) 25 MG tablet Take 1 tablet (25 mg total) by mouth 2 (two)  times daily. 05/26/18   Gouru, Illene Silver, MD  pantoprazole (PROTONIX) 40 MG tablet Take 1 tablet (40 mg total) by mouth daily. 04/09/19   Loletha Grayer, MD  polyethylene glycol (MIRALAX / GLYCOLAX) 17 g packet Take 17 g by mouth daily as needed for severe constipation. 04/09/19   Loletha Grayer, MD  spironolactone (ALDACTONE) 25 MG tablet Take 12.5 mg by mouth daily.    [provider]    Allergies Ciprofloxacin, Codeine, and Sulfamethoxazole-trimethoprim  Family History  Problem Relation Age of Onset   Hypertension Father    CAD Mother     Social History Social History   Tobacco Use   Smoking status: Never Smoker   Smokeless tobacco: Never Used  Substance Use Topics   Alcohol use: No   Drug use: No      Review of Systems Constitutional: No fever/chills, positive fall Eyes: No visual changes.  Bruising around the eye ENT: No sore throat. Cardiovascular: Denies chest pain. Respiratory: Denies shortness of breath. Gastrointestinal: No abdominal pain.  No nausea, no vomiting.  No diarrhea.  No constipation. Genitourinary: Negative for dysuria. Musculoskeletal: Negative for back pain. Skin: Negative for rash. Neurological: Negative for headaches, focal weakness or numbness. All other ROS negative ____________________________________________   PHYSICAL EXAM:  VITAL SIGNS: ED Triage Vitals  Enc Vitals Group     BP 08/05/19 1404 (!) 123/53     Pulse Rate 08/05/19 1404 64     Resp 08/05/19 1404 18     Temp 08/05/19 1404 98 F (36.7 C)     Temp Source 08/05/19 1404 Oral     SpO2 08/05/19 1404 99 %     Weight 08/05/19 1405 150 lb (68 kg)     Height 08/05/19 1405 5\' 1"  (1.549 m)     Head Circumference --      Peak Flow --      Pain Score 08/05/19 1405 0     Pain Loc --      Pain Edu? --      Excl. in [place]? --     Constitutional: Alert and oriented. Well appearing and in no acute distress. Eyes: Conjunctivae are normal. EOMI. pupils are equal round and  reactive.  Bruising around the right eye and into the right forehead.  Vision is unchanged. Head: Bruising on the top of her forehead. Nose: No congestion/rhinnorhea.  No nasal bone tenderness Mouth/Throat: Mucous membranes are moist.   Neck: No stridor. Trachea Midline. FROM Cardiovascular: Normal rate, regular rhythm. Grossly normal heart sounds.  Good peripheral circulation. Respiratory: Normal respiratory effort.  No retractions. Lungs CTAB. Gastrointestinal: Soft and nontender. No distention. No abdominal bruits.  Musculoskeletal: No lower extremity tenderness nor edema.  No joint effusions. Neurologic:  Normal speech and language. No gross focal neurologic deficits are appreciated.  Skin:  Skin is warm, dry  and intact. No rash noted. Psychiatric: Mood and affect are normal. Speech and behavior are normal. GU: Deferred   ____________________________________________   LABS (all labs ordered are listed, but only abnormal results are displayed)  Labs Reviewed  CBC WITH DIFFERENTIAL/PLATELET - Abnormal; Notable for the following components:      Result Value   WBC 2.6 (*)    RBC 3.68 (*)    Hemoglobin 11.1 (*)    HCT 35.0 (*)    RDW 17.2 (*)    Platelets 40 (*)    Neutro Abs 1.4 (*)    All other components within normal limits  BASIC METABOLIC PANEL - Abnormal; Notable for the following components:   Glucose, Bld 151 (*)    All other components within normal limits   ____________________________________________   ED ECG REPORT I, Vanessa [place], the attending physician, personally viewed and interpreted this ECG.  EKG is sinus, rate of 68, no ST elevation, no T wave inversions, widened QRS, appears similar to prior EKG ____________________________________________  RADIOLOGY  Official radiology report(s): Ct Head Wo Contrast  Result Date: 08/05/2019 CLINICAL DATA:  Fall with developing right periorbital contusion EXAM: CT HEAD WITHOUT CONTRAST CT MAXILLOFACIAL WITHOUT  CONTRAST CT CERVICAL SPINE WITHOUT CONTRAST TECHNIQUE: Multidetector CT imaging of the head, cervical spine, and maxillofacial structures were performed using the standard protocol without intravenous contrast. Multiplanar CT image reconstructions of the cervical spine and maxillofacial structures were also generated. COMPARISON:  CT head May 02, 2019, CT cervical spine July 28, 2015 FINDINGS: CT HEAD FINDINGS Brain: No evidence of acute infarction, hemorrhage, hydrocephalus, extra-axial collection or mass lesion/mass effect. Symmetric prominence of the ventricles, cisterns and sulci compatible with parenchymal volume loss. Patchy areas of white matter hypoattenuation are most compatible with chronic microvascular angiopathy. Vascular: Atherosclerotic calcification of the carotid siphons and intradural vertebral arteries. No hyperdense vessel. Skull: Right frontal scalp swelling and thin crescentic hematoma measuring up to 5 mm in maximal thickness. No subjacent calvarial fracture. Other: None CT MAXILLOFACIAL FINDINGS Osseous: No fracture of the bony orbits. Prior left nasal bone fracture which appears corticated. No other mid face fractures are seen. The pterygoid plates are intact. The mandible is intact. Temporomandibular joints are normally aligned with mild arthrosis. No temporal bone fractures are identified. No fractured or avulsed teeth. Multiple absent dentition. Several orthotic dental implants with resultant streak artifact. Carious lesions of the bilateral mandibular central and lateral incisors. Orbits: Soft tissue swelling extends to the palpebra. No retro septal fat stranding or gas. Prior bilateral lens extractions. The globes appear otherwise normal and symmetric. Symmetric appearance of the extraocular musculature and optic nerve sheath complexes. Normal caliber of the superior ophthalmic veins. Sinuses: Nodular mural thickening throughout the maxillary sinuses and minimally within the  ethmoids. No air-fluid levels. Mastoid air cells are clear. Middle ear cavities are clear. Ossicular chains are normal configuration Soft tissues: Frontal scalp soft tissue swelling extends inferiorly across the periorbital soft tissues and into the right malar soft tissues as well. There are stable soft tissue calcifications along the right temporal bone. CT CERVICAL SPINE FINDINGS Alignment: Reversal of the normal cervical lordosis centered at C5. Mild degenerative stepwise anterolisthesis of C2-C4. No traumatic listhesis. Normal alignment of the posterior elements. No abnormally widened facets. Craniocervical and toe axial articulations are normal configuration. Skull base and vertebrae: No acute osseous or soft tissue abnormality. Mild bony demineralization is noted Soft tissues and spinal canal: No pre or paravertebral fluid or swelling. No visible canal hematoma.  Disc levels: Multilevel intervertebral disc height loss with spondylitic endplate changes. Features are maximal at C4-5 and C5-6 where posterior disc osteophyte complexes result in mild-to-moderate canal stenosis. Multilevel uncinate spurring and facet hypertrophic changes are present as well with mild to moderate multilevel foraminal narrowing and a more severe narrowing noted on the right at C4-5. Upper chest: No acute abnormality in the upper chest or imaged lung apices. Other: Heterogeneously nodular and enlarged thyroid predominantly involving the left lobe thyroid gland. Several partially calcified nodules are present as well. Cervical carotid artery atherosclerosis is noted. IMPRESSION: 1. No acute intracranial abnormality. Stable atrophy and chronic microvascular ischemic white matter disease. 2. Right frontal scalp swelling and thin crescentic hematoma without subjacent calvarial fracture. 3. Periorbital and right malar soft tissue swelling noted as well. 4. A left nasal bone fracture has a remote appearance. Could correlate for point  tenderness. No other acute facial fractures identified. 5. Soft tissue swelling extends to the right palpebra. No orbital injury is identified. 6. Periodontal disease and temporomandibular joint arthrosis. No acute cervical spine fracture. Multilevel degenerative changes throughout the cervical spine. Maximal C4-5 and C5-6, as detailed above. 7. Heterogeneously nodular and enlarged thyroid predominantly involving the left lobe thyroid gland. Consider further evaluation with thyroid ultrasound. This follows consensus guidelines: Managing Incidental Thyroid Nodules Detected on Imaging: White Paper of the ACR Incidental Thyroid Findings Committee. J Am Coll Radiol 2015; 12:143-150. and [facility] 3-tiered system for managing ITNs: J Am Coll Radiol. 2015; Feb;12(2): 143-50 Electronically Signed   By: Lovena Le M.D.   On: 08/05/2019 14:50   Ct Cervical Spine Wo Contrast  Result Date: 08/05/2019 CLINICAL DATA:  Fall with developing right periorbital contusion EXAM: CT HEAD WITHOUT CONTRAST CT MAXILLOFACIAL WITHOUT CONTRAST CT CERVICAL SPINE WITHOUT CONTRAST TECHNIQUE: Multidetector CT imaging of the head, cervical spine, and maxillofacial structures were performed using the standard protocol without intravenous contrast. Multiplanar CT image reconstructions of the cervical spine and maxillofacial structures were also generated. COMPARISON:  CT head May 02, 2019, CT cervical spine July 28, 2015 FINDINGS: CT HEAD FINDINGS Brain: No evidence of acute infarction, hemorrhage, hydrocephalus, extra-axial collection or mass lesion/mass effect. Symmetric prominence of the ventricles, cisterns and sulci compatible with parenchymal volume loss. Patchy areas of white matter hypoattenuation are most compatible with chronic microvascular angiopathy. Vascular: Atherosclerotic calcification of the carotid siphons and intradural vertebral arteries. No hyperdense vessel. Skull: Right frontal scalp swelling and thin crescentic  hematoma measuring up to 5 mm in maximal thickness. No subjacent calvarial fracture. Other: None CT MAXILLOFACIAL FINDINGS Osseous: No fracture of the bony orbits. Prior left nasal bone fracture which appears corticated. No other mid face fractures are seen. The pterygoid plates are intact. The mandible is intact. Temporomandibular joints are normally aligned with mild arthrosis. No temporal bone fractures are identified. No fractured or avulsed teeth. Multiple absent dentition. Several orthotic dental implants with resultant streak artifact. Carious lesions of the bilateral mandibular central and lateral incisors. Orbits: Soft tissue swelling extends to the palpebra. No retro septal fat stranding or gas. Prior bilateral lens extractions. The globes appear otherwise normal and symmetric. Symmetric appearance of the extraocular musculature and optic nerve sheath complexes. Normal caliber of the superior ophthalmic veins. Sinuses: Nodular mural thickening throughout the maxillary sinuses and minimally within the ethmoids. No air-fluid levels. Mastoid air cells are clear. Middle ear cavities are clear. Ossicular chains are normal configuration Soft tissues: Frontal scalp soft tissue swelling extends inferiorly across the periorbital soft tissues and into  the right malar soft tissues as well. There are stable soft tissue calcifications along the right temporal bone. CT CERVICAL SPINE FINDINGS Alignment: Reversal of the normal cervical lordosis centered at C5. Mild degenerative stepwise anterolisthesis of C2-C4. No traumatic listhesis. Normal alignment of the posterior elements. No abnormally widened facets. Craniocervical and toe axial articulations are normal configuration. Skull base and vertebrae: No acute osseous or soft tissue abnormality. Mild bony demineralization is noted Soft tissues and spinal canal: No pre or paravertebral fluid or swelling. No visible canal hematoma. Disc levels: Multilevel intervertebral  disc height loss with spondylitic endplate changes. Features are maximal at C4-5 and C5-6 where posterior disc osteophyte complexes result in mild-to-moderate canal stenosis. Multilevel uncinate spurring and facet hypertrophic changes are present as well with mild to moderate multilevel foraminal narrowing and a more severe narrowing noted on the right at C4-5. Upper chest: No acute abnormality in the upper chest or imaged lung apices. Other: Heterogeneously nodular and enlarged thyroid predominantly involving the left lobe thyroid gland. Several partially calcified nodules are present as well. Cervical carotid artery atherosclerosis is noted. IMPRESSION: 1. No acute intracranial abnormality. Stable atrophy and chronic microvascular ischemic white matter disease. 2. Right frontal scalp swelling and thin crescentic hematoma without subjacent calvarial fracture. 3. Periorbital and right malar soft tissue swelling noted as well. 4. A left nasal bone fracture has a remote appearance. Could correlate for point tenderness. No other acute facial fractures identified. 5. Soft tissue swelling extends to the right palpebra. No orbital injury is identified. 6. Periodontal disease and temporomandibular joint arthrosis. No acute cervical spine fracture. Multilevel degenerative changes throughout the cervical spine. Maximal C4-5 and C5-6, as detailed above. 7. Heterogeneously nodular and enlarged thyroid predominantly involving the left lobe thyroid gland. Consider further evaluation with thyroid ultrasound. This follows consensus guidelines: Managing Incidental Thyroid Nodules Detected on Imaging: White Paper of the ACR Incidental Thyroid Findings Committee. J Am Coll Radiol 2015; 12:143-150. and [facility] 3-tiered system for managing ITNs: J Am Coll Radiol. 2015; Feb;12(2): 143-50 Electronically Signed   By: Lovena Le M.D.   On: 08/05/2019 14:50   Ct Maxillofacial Wo Contrast  Result Date: 08/05/2019 CLINICAL DATA:  Fall  with developing right periorbital contusion EXAM: CT HEAD WITHOUT CONTRAST CT MAXILLOFACIAL WITHOUT CONTRAST CT CERVICAL SPINE WITHOUT CONTRAST TECHNIQUE: Multidetector CT imaging of the head, cervical spine, and maxillofacial structures were performed using the standard protocol without intravenous contrast. Multiplanar CT image reconstructions of the cervical spine and maxillofacial structures were also generated. COMPARISON:  CT head May 02, 2019, CT cervical spine July 28, 2015 FINDINGS: CT HEAD FINDINGS Brain: No evidence of acute infarction, hemorrhage, hydrocephalus, extra-axial collection or mass lesion/mass effect. Symmetric prominence of the ventricles, cisterns and sulci compatible with parenchymal volume loss. Patchy areas of white matter hypoattenuation are most compatible with chronic microvascular angiopathy. Vascular: Atherosclerotic calcification of the carotid siphons and intradural vertebral arteries. No hyperdense vessel. Skull: Right frontal scalp swelling and thin crescentic hematoma measuring up to 5 mm in maximal thickness. No subjacent calvarial fracture. Other: None CT MAXILLOFACIAL FINDINGS Osseous: No fracture of the bony orbits. Prior left nasal bone fracture which appears corticated. No other mid face fractures are seen. The pterygoid plates are intact. The mandible is intact. Temporomandibular joints are normally aligned with mild arthrosis. No temporal bone fractures are identified. No fractured or avulsed teeth. Multiple absent dentition. Several orthotic dental implants with resultant streak artifact. Carious lesions of the bilateral mandibular central and lateral incisors. Orbits:  Soft tissue swelling extends to the palpebra. No retro septal fat stranding or gas. Prior bilateral lens extractions. The globes appear otherwise normal and symmetric. Symmetric appearance of the extraocular musculature and optic nerve sheath complexes. Normal caliber of the superior ophthalmic  veins. Sinuses: Nodular mural thickening throughout the maxillary sinuses and minimally within the ethmoids. No air-fluid levels. Mastoid air cells are clear. Middle ear cavities are clear. Ossicular chains are normal configuration Soft tissues: Frontal scalp soft tissue swelling extends inferiorly across the periorbital soft tissues and into the right malar soft tissues as well. There are stable soft tissue calcifications along the right temporal bone. CT CERVICAL SPINE FINDINGS Alignment: Reversal of the normal cervical lordosis centered at C5. Mild degenerative stepwise anterolisthesis of C2-C4. No traumatic listhesis. Normal alignment of the posterior elements. No abnormally widened facets. Craniocervical and toe axial articulations are normal configuration. Skull base and vertebrae: No acute osseous or soft tissue abnormality. Mild bony demineralization is noted Soft tissues and spinal canal: No pre or paravertebral fluid or swelling. No visible canal hematoma. Disc levels: Multilevel intervertebral disc height loss with spondylitic endplate changes. Features are maximal at C4-5 and C5-6 where posterior disc osteophyte complexes result in mild-to-moderate canal stenosis. Multilevel uncinate spurring and facet hypertrophic changes are present as well with mild to moderate multilevel foraminal narrowing and a more severe narrowing noted on the right at C4-5. Upper chest: No acute abnormality in the upper chest or imaged lung apices. Other: Heterogeneously nodular and enlarged thyroid predominantly involving the left lobe thyroid gland. Several partially calcified nodules are present as well. Cervical carotid artery atherosclerosis is noted. IMPRESSION: 1. No acute intracranial abnormality. Stable atrophy and chronic microvascular ischemic white matter disease. 2. Right frontal scalp swelling and thin crescentic hematoma without subjacent calvarial fracture. 3. Periorbital and right malar soft tissue swelling  noted as well. 4. A left nasal bone fracture has a remote appearance. Could correlate for point tenderness. No other acute facial fractures identified. 5. Soft tissue swelling extends to the right palpebra. No orbital injury is identified. 6. Periodontal disease and temporomandibular joint arthrosis. No acute cervical spine fracture. Multilevel degenerative changes throughout the cervical spine. Maximal C4-5 and C5-6, as detailed above. 7. Heterogeneously nodular and enlarged thyroid predominantly involving the left lobe thyroid gland. Consider further evaluation with thyroid ultrasound. This follows consensus guidelines: Managing Incidental Thyroid Nodules Detected on Imaging: White Paper of the ACR Incidental Thyroid Findings Committee. J Am Coll Radiol 2015; 12:143-150. and [facility] 3-tiered system for managing ITNs: J Am Coll Radiol. 2015; Feb;12(2): 143-50 Electronically Signed   By: Lovena Le M.D.   On: 08/05/2019 14:50    ____________________________________________   PROCEDURES  Procedure(s) performed (including Critical Care):  Procedures   ____________________________________________   INITIAL IMPRESSION / ASSESSMENT AND PLAN / ED COURSE  KAYELYNN BEAUCHENE was evaluated in Emergency Department on 08/05/2019 for the symptoms described in the history of present illness. She was evaluated in the context of the global COVID-19 pandemic, which necessitated consideration that the patient might be at risk for infection with the SARS-CoV-2 virus that causes COVID-19. Institutional protocols and algorithms that pertain to the evaluation of patients at risk for COVID-19 are in a state of rapid change based on information released by regulatory bodies including the CDC and federal and state organizations. These policies and algorithms were followed during the patient's care in the ED.    Patient is a 85 year old with known low platelets who presents with a fall greater  than 24 hours ago.  Will get CT  head evaluate for intracranial hemorrhage, CT cervical evaluate for cervical fracture, CT face to evaluate for orbital fracture.  She denies it being syncope but will get EKG and basic labs to confirm.  No abdominal tenderness chest wall tenderness to suggest other injuries.  Patient is been ambulatory since the fall.  She otherwise feels at her baseline self.  She denies any urinary symptoms to suggest UTI  CT scan shows right frontal scalp swelling.  She does have a left nasal bone fracture with a remote appearance.  She has no tenderness at this time and she is no septal hematoma therefore unlikely to be acute.  No obvious orbital fracture.  Patient does have a enlarged thyroid which she will need follow-up with an ultrasound  Her white count is at 2.6 which is stable from 3 days ago but she denies any infectious symptoms.  Her hemoglobin is at baseline.  Her platelets are at 40 but given the fall happened yesterday and the CT is negative and that is reassuring that she will unlikely have delayed intracranial hemorrhage.  I do not think she needs a platelet transfusion at this time.  I discussed with patient that if she developed bleeding from her nose, mouth, stool she would need to come in for transfusion her platelets were still under 50.  She has follow-up to have her platelets rechecked in 1 week.   Patient did not want to ambulate in front of [place] and grandson is at bedside and is okay with that.  She has been ambulatory since the fall and she does not want to ambulate without her walker because she is want to fall again.  Patient does prefer to go home at this time.  She feels safe at home.  She understands that she should follow-up for her platelet recheck.   Provide a copy of her CT report for follow-up.     ____________________________________________   FINAL CLINICAL IMPRESSION(S) / ED DIAGNOSES   Final diagnoses:  Fall, initial encounter  Contusion of face, initial encounter       MEDICATIONS GIVEN DURING THIS VISIT:  Medications - No data to display   ED Discharge Orders    None       Note:  This document was prepared using Dragon voice recognition software and may include unintentional dictation errors.   Vanessa Glenwillow, MD 08/05/19 1733    Vanessa , MD 08/05/19 [phone]

## 2019-08-05 NOTE — Discharge Instructions (Addendum)
CTS as below.  She got no tenderness on her nose therefore I think the nasal fracture is old.  Her platelets were lower than a few days ago at 40.  She should have her platelets rechecked within a week with her primary care doctor.  If they are still downtrending she may need transfusion if they are less than 10 or if she starts having bleeding she needs to come back in for transfusion.  Return to the ER for any other concerns.   1. No acute intracranial abnormality. Stable atrophy and chronic microvascular ischemic white matter disease. 2. Right frontal scalp swelling and thin crescentic hematoma without subjacent calvarial fracture. 3. Periorbital and right malar soft tissue swelling noted as well. 4. A left nasal bone fracture has a remote appearance. Could correlate for point tenderness. No other acute facial fractures identified. 5. Soft tissue swelling extends to the right palpebra. No orbital injury is identified. 6. Periodontal disease and temporomandibular joint arthrosis. No acute cervical spine fracture. Multilevel degenerative changes throughout the cervical spine. Maximal C4-5 and C5-6, as detailed above. 7. Heterogeneously nodular and enlarged thyroid predominantly involving the left lobe thyroid gland. Consider further evaluation with thyroid ultrasound. This follows consensus guidelines: Managing Incidental Thyroid Nodules Detected on Imaging: White Paper of the ACR Incidental Thyroid Findings Committee. J Am Coll Radiol 2015; 12:143-150. and [facility] 3-tiered system for managing ITNs: J Am Coll Radiol. 2015; Feb;12(2): 143-50

## 2019-08-05 NOTE — ED Notes (Signed)
ED Provider at bedside. 

## 2019-08-05 NOTE — ED Notes (Signed)
Discharged with her home oxygen.

## 2019-08-05 NOTE — Telephone Encounter (Signed)
Patient and daughter called reporting that patient fell last night hitting her head, she did not initially have any bruising, but this morning, she has a bruise on her head and around her right eye which is getting darker in color, she denies any swelling to area. She is asking what she should do. I advised her to put ice or cold compress on it until I could get response from Dr Grayland Ormond. Her recent platelet count was 41K. Please advise

## 2019-08-05 NOTE — ED Notes (Signed)
Discharged with grandson.

## 2019-08-10 LAB — NEUTROPHIL AB TEST LEVEL 1: NEUTROPHIL SCR/PANEL INTERP.: NEGATIVE

## 2019-08-11 NOTE — Progress Notes (Signed)
[place]  [phone] [phone]) [phone]  ID: ALDEA AVIS OB: 05-21-1925  MR#: [id number]  GUR#:[id number]  Patient Care Team: Idelle Crouch, MD as PCP - General (Internal Medicine)  I connected with [name] on 08/21/19 at  2:15 PM EST by video enabled telemedicine visit and verified that I am speaking with the correct person using two identifiers.   I discussed the limitations, risks, security and privacy concerns of performing an evaluation and management service by telemedicine and the availability of in-person appointments. I also discussed with the patient that there may be a patient responsible charge related to this service. The patient expressed understanding and agreed to proceed.   Other persons participating in the visit and their role in the encounter: Patient, patient's granddaughter, MD  Patients location: Home Providers location: Clinic  CHIEF COMPLAINT: Pancytopenia.  INTERVAL HISTORY: Patient agreed to video assisted telemedicine visit for discussion of her laboratory work and further evaluation.  Since her last evaluation she had a fall with head injury and was evaluated in the emergency room.  She did not require admission to the hospital.  She currently feels well and is back to her baseline.  She has had no further falls.  She has no neurologic complaints.  She denies any recent fevers or illnesses. She denies any easy bleeding or bruising.  She has a good appetite and denies weight loss.  She has no chest pain, shortness of breath, cough, or hemoptysis.  She denies any nausea, vomiting, constipation, or diarrhea.  She has no urinary complaints.  Patient offers no specific complaints today.  REVIEW OF SYSTEMS:   Review of Systems  Constitutional: Negative.  Negative for fever, malaise/fatigue and weight loss.  Respiratory: Negative.  Negative for cough, hemoptysis and shortness of breath.   Cardiovascular: Negative.   Negative for chest pain and leg swelling.  Gastrointestinal: Negative.  Negative for abdominal pain.  Genitourinary: Negative.  Negative for dysuria.  Musculoskeletal: Negative.  Negative for back pain.  Skin: Negative.  Negative for rash.  Neurological: Negative.  Negative for dizziness, focal weakness, weakness and headaches.  Endo/Heme/Allergies: Does not bruise/bleed easily.  Psychiatric/Behavioral: Negative.  The patient is not nervous/anxious.     As per HPI. Otherwise, a complete review of systems is negative.  PAST MEDICAL HISTORY: Past Medical History:  Diagnosis Date   Aortic stenosis    Arthritis    CHF (congestive heart failure) ([place])    Fall at home 07/28/2015   GERD (gastroesophageal reflux disease)    H/O bladder infections    Hypertension    Neuropathy    lower extrmities    PAST SURGICAL HISTORY: Past Surgical History:  Procedure Laterality Date   ABDOMINAL HYSTERECTOMY     CATARACT EXTRACTION     COLON SURGERY     ESOPHAGOGASTRODUODENOSCOPY N/A 05/22/2018   Procedure: ESOPHAGOGASTRODUODENOSCOPY (EGD);  Surgeon: Virgel Manifold, MD;  Location: [facility] ENDOSCOPY;  Service: Endoscopy;  Laterality: N/A;   EYE SURGERY     FOOT SURGERY Right    ROTATOR CUFF REPAIR Left    TONSILLECTOMY      FAMILY HISTORY: Family History  Problem Relation Age of Onset   Hypertension Father    CAD Mother     ADVANCED DIRECTIVES (Y/N):  N  HEALTH MAINTENANCE: Social History   Tobacco Use   Smoking status: Never Smoker   Smokeless tobacco: Never Used  Substance Use Topics   Alcohol use: No   Drug use: No  Colonoscopy:  PAP:  Bone density:  Lipid panel:  Allergies  Allergen Reactions   Ciprofloxacin Itching and Rash   Codeine Other (See Comments)    Upset stomach   Sulfamethoxazole-Trimethoprim Rash    Current Outpatient Medications  Medication Sig Dispense Refill   acetaminophen (TYLENOL) 325 MG tablet Take 2 tablets (650  mg total) by mouth every 6 (six) hours as needed for mild pain (or Fever >/= 101).     albuterol (VENTOLIN HFA) 108 (90 Base) MCG/ACT inhaler Inhale 2 puffs into the lungs every 6 (six) hours as needed for wheezing or shortness of breath. (Patient not taking: Reported on 08/02/2019) 8 g 0   amiodarone (PACERONE) 200 MG tablet Take 0.5 tablets (100 mg total) by mouth daily.     aspirin EC 81 MG tablet Take 81 mg by mouth daily.     ferrous sulfate 325 (65 FE) MG tablet Take 325 mg by mouth daily.     fexofenadine (ALLEGRA) 60 MG tablet Take 60 mg by mouth daily. Pt daughter said pt takes this as needed, nurse home doing it daily     furosemide (LASIX) 20 MG tablet Take 20 mg by mouth 2 (two) times daily.     gabapentin (NEURONTIN) 300 MG capsule Take 300 mg by mouth 3 (three) times daily.     metoprolol tartrate (LOPRESSOR) 25 MG tablet Take 1 tablet (25 mg total) by mouth 2 (two) times daily.     pantoprazole (PROTONIX) 40 MG tablet Take 1 tablet (40 mg total) by mouth daily.     polyethylene glycol (MIRALAX / GLYCOLAX) 17 g packet Take 17 g by mouth daily as needed for severe constipation. 14 each 0   spironolactone (ALDACTONE) 25 MG tablet Take 12.5 mg by mouth daily.     No current facility-administered medications for this visit.     OBJECTIVE: There were no vitals filed for this visit.   There is no height or weight on file to calculate BMI.    ECOG FS:0 - Asymptomatic  General: Thin, no acute distress. HEENT: Normocephalic, no obvious residual ecchymosis. Neuro: Alert, answering all questions appropriately. Cranial nerves grossly intact. Psych: Normal affect.  LAB RESULTS:  Lab Results  Component Value Date   NA 141 08/05/2019   K 4.2 08/05/2019   CL 104 08/05/2019   CO2 27 08/05/2019   GLUCOSE 151 (H) 08/05/2019   BUN 17 08/05/2019   CREATININE 0.77 08/05/2019   CALCIUM 9.2 08/05/2019   PROT 7.0 04/19/2019   ALBUMIN 4.1 04/19/2019   AST 17 04/19/2019   ALT 15  04/19/2019   ALKPHOS 68 04/19/2019   BILITOT 0.6 04/19/2019   GFRNONAA >60 08/05/2019   GFRAA >60 08/05/2019    Lab Results  Component Value Date   WBC 2.6 (L) 08/05/2019   NEUTROABS 1.4 (L) 08/05/2019   HGB 11.1 (L) 08/05/2019   HCT 35.0 (L) 08/05/2019   MCV 95.1 08/05/2019   PLT 40 (L) 08/05/2019     STUDIES: Ct Head Wo Contrast  Result Date: 08/05/2019 CLINICAL DATA:  Fall with developing right periorbital contusion EXAM: CT HEAD WITHOUT CONTRAST CT MAXILLOFACIAL WITHOUT CONTRAST CT CERVICAL SPINE WITHOUT CONTRAST TECHNIQUE: Multidetector CT imaging of the head, cervical spine, and maxillofacial structures were performed using the standard protocol without intravenous contrast. Multiplanar CT image reconstructions of the cervical spine and maxillofacial structures were also generated. COMPARISON:  CT head May 02, 2019, CT cervical spine July 28, 2015 FINDINGS: CT HEAD FINDINGS Brain: No  evidence of acute infarction, hemorrhage, hydrocephalus, extra-axial collection or mass lesion/mass effect. Symmetric prominence of the ventricles, cisterns and sulci compatible with parenchymal volume loss. Patchy areas of white matter hypoattenuation are most compatible with chronic microvascular angiopathy. Vascular: Atherosclerotic calcification of the carotid siphons and intradural vertebral arteries. No hyperdense vessel. Skull: Right frontal scalp swelling and thin crescentic hematoma measuring up to 5 mm in maximal thickness. No subjacent calvarial fracture. Other: None CT MAXILLOFACIAL FINDINGS Osseous: No fracture of the bony orbits. Prior left nasal bone fracture which appears corticated. No other mid face fractures are seen. The pterygoid plates are intact. The mandible is intact. Temporomandibular joints are normally aligned with mild arthrosis. No temporal bone fractures are identified. No fractured or avulsed teeth. Multiple absent dentition. Several orthotic dental implants with  resultant streak artifact. Carious lesions of the bilateral mandibular central and lateral incisors. Orbits: Soft tissue swelling extends to the palpebra. No retro septal fat stranding or gas. Prior bilateral lens extractions. The globes appear otherwise normal and symmetric. Symmetric appearance of the extraocular musculature and optic nerve sheath complexes. Normal caliber of the superior ophthalmic veins. Sinuses: Nodular mural thickening throughout the maxillary sinuses and minimally within the ethmoids. No air-fluid levels. Mastoid air cells are clear. Middle ear cavities are clear. Ossicular chains are normal configuration Soft tissues: Frontal scalp soft tissue swelling extends inferiorly across the periorbital soft tissues and into the right malar soft tissues as well. There are stable soft tissue calcifications along the right temporal bone. CT CERVICAL SPINE FINDINGS Alignment: Reversal of the normal cervical lordosis centered at C5. Mild degenerative stepwise anterolisthesis of C2-C4. No traumatic listhesis. Normal alignment of the posterior elements. No abnormally widened facets. Craniocervical and toe axial articulations are normal configuration. Skull base and vertebrae: No acute osseous or soft tissue abnormality. Mild bony demineralization is noted Soft tissues and spinal canal: No pre or paravertebral fluid or swelling. No visible canal hematoma. Disc levels: Multilevel intervertebral disc height loss with spondylitic endplate changes. Features are maximal at C4-5 and C5-6 where posterior disc osteophyte complexes result in mild-to-moderate canal stenosis. Multilevel uncinate spurring and facet hypertrophic changes are present as well with mild to moderate multilevel foraminal narrowing and a more severe narrowing noted on the right at C4-5. Upper chest: No acute abnormality in the upper chest or imaged lung apices. Other: Heterogeneously nodular and enlarged thyroid predominantly involving the left  lobe thyroid gland. Several partially calcified nodules are present as well. Cervical carotid artery atherosclerosis is noted. IMPRESSION: 1. No acute intracranial abnormality. Stable atrophy and chronic microvascular ischemic white matter disease. 2. Right frontal scalp swelling and thin crescentic hematoma without subjacent calvarial fracture. 3. Periorbital and right malar soft tissue swelling noted as well. 4. A left nasal bone fracture has a remote appearance. Could correlate for point tenderness. No other acute facial fractures identified. 5. Soft tissue swelling extends to the right palpebra. No orbital injury is identified. 6. Periodontal disease and temporomandibular joint arthrosis. No acute cervical spine fracture. Multilevel degenerative changes throughout the cervical spine. Maximal C4-5 and C5-6, as detailed above. 7. Heterogeneously nodular and enlarged thyroid predominantly involving the left lobe thyroid gland. Consider further evaluation with thyroid ultrasound. This follows consensus guidelines: Managing Incidental Thyroid Nodules Detected on Imaging: White Paper of the ACR Incidental Thyroid Findings Committee. J Am Coll Radiol 2015; 12:143-150. and [facility] 3-tiered system for managing ITNs: J Am Coll Radiol. 2015; BZJ;69(6): 143-50 Electronically Signed   By: Elwin Sleight.D.  On: 08/05/2019 14:50   Ct Cervical Spine Wo Contrast  Result Date: 08/05/2019 CLINICAL DATA:  Fall with developing right periorbital contusion EXAM: CT HEAD WITHOUT CONTRAST CT MAXILLOFACIAL WITHOUT CONTRAST CT CERVICAL SPINE WITHOUT CONTRAST TECHNIQUE: Multidetector CT imaging of the head, cervical spine, and maxillofacial structures were performed using the standard protocol without intravenous contrast. Multiplanar CT image reconstructions of the cervical spine and maxillofacial structures were also generated. COMPARISON:  CT head May 02, 2019, CT cervical spine July 28, 2015 FINDINGS: CT HEAD FINDINGS Brain:  No evidence of acute infarction, hemorrhage, hydrocephalus, extra-axial collection or mass lesion/mass effect. Symmetric prominence of the ventricles, cisterns and sulci compatible with parenchymal volume loss. Patchy areas of white matter hypoattenuation are most compatible with chronic microvascular angiopathy. Vascular: Atherosclerotic calcification of the carotid siphons and intradural vertebral arteries. No hyperdense vessel. Skull: Right frontal scalp swelling and thin crescentic hematoma measuring up to 5 mm in maximal thickness. No subjacent calvarial fracture. Other: None CT MAXILLOFACIAL FINDINGS Osseous: No fracture of the bony orbits. Prior left nasal bone fracture which appears corticated. No other mid face fractures are seen. The pterygoid plates are intact. The mandible is intact. Temporomandibular joints are normally aligned with mild arthrosis. No temporal bone fractures are identified. No fractured or avulsed teeth. Multiple absent dentition. Several orthotic dental implants with resultant streak artifact. Carious lesions of the bilateral mandibular central and lateral incisors. Orbits: Soft tissue swelling extends to the palpebra. No retro septal fat stranding or gas. Prior bilateral lens extractions. The globes appear otherwise normal and symmetric. Symmetric appearance of the extraocular musculature and optic nerve sheath complexes. Normal caliber of the superior ophthalmic veins. Sinuses: Nodular mural thickening throughout the maxillary sinuses and minimally within the ethmoids. No air-fluid levels. Mastoid air cells are clear. Middle ear cavities are clear. Ossicular chains are normal configuration Soft tissues: Frontal scalp soft tissue swelling extends inferiorly across the periorbital soft tissues and into the right malar soft tissues as well. There are stable soft tissue calcifications along the right temporal bone. CT CERVICAL SPINE FINDINGS Alignment: Reversal of the normal cervical  lordosis centered at C5. Mild degenerative stepwise anterolisthesis of C2-C4. No traumatic listhesis. Normal alignment of the posterior elements. No abnormally widened facets. Craniocervical and toe axial articulations are normal configuration. Skull base and vertebrae: No acute osseous or soft tissue abnormality. Mild bony demineralization is noted Soft tissues and spinal canal: No pre or paravertebral fluid or swelling. No visible canal hematoma. Disc levels: Multilevel intervertebral disc height loss with spondylitic endplate changes. Features are maximal at C4-5 and C5-6 where posterior disc osteophyte complexes result in mild-to-moderate canal stenosis. Multilevel uncinate spurring and facet hypertrophic changes are present as well with mild to moderate multilevel foraminal narrowing and a more severe narrowing noted on the right at C4-5. Upper chest: No acute abnormality in the upper chest or imaged lung apices. Other: Heterogeneously nodular and enlarged thyroid predominantly involving the left lobe thyroid gland. Several partially calcified nodules are present as well. Cervical carotid artery atherosclerosis is noted. IMPRESSION: 1. No acute intracranial abnormality. Stable atrophy and chronic microvascular ischemic white matter disease. 2. Right frontal scalp swelling and thin crescentic hematoma without subjacent calvarial fracture. 3. Periorbital and right malar soft tissue swelling noted as well. 4. A left nasal bone fracture has a remote appearance. Could correlate for point tenderness. No other acute facial fractures identified. 5. Soft tissue swelling extends to the right palpebra. No orbital injury is identified. 6. Periodontal disease and temporomandibular  joint arthrosis. No acute cervical spine fracture. Multilevel degenerative changes throughout the cervical spine. Maximal C4-5 and C5-6, as detailed above. 7. Heterogeneously nodular and enlarged thyroid predominantly involving the left lobe  thyroid gland. Consider further evaluation with thyroid ultrasound. This follows consensus guidelines: Managing Incidental Thyroid Nodules Detected on Imaging: White Paper of the ACR Incidental Thyroid Findings Committee. J Am Coll Radiol 2015; 12:143-150. and [facility] 3-tiered system for managing ITNs: J Am Coll Radiol. 2015; Feb;12(2): 143-50 Electronically Signed   By: Lovena Le M.D.   On: 08/05/2019 14:50   Ct Maxillofacial Wo Contrast  Result Date: 08/05/2019 CLINICAL DATA:  Fall with developing right periorbital contusion EXAM: CT HEAD WITHOUT CONTRAST CT MAXILLOFACIAL WITHOUT CONTRAST CT CERVICAL SPINE WITHOUT CONTRAST TECHNIQUE: Multidetector CT imaging of the head, cervical spine, and maxillofacial structures were performed using the standard protocol without intravenous contrast. Multiplanar CT image reconstructions of the cervical spine and maxillofacial structures were also generated. COMPARISON:  CT head May 02, 2019, CT cervical spine July 28, 2015 FINDINGS: CT HEAD FINDINGS Brain: No evidence of acute infarction, hemorrhage, hydrocephalus, extra-axial collection or mass lesion/mass effect. Symmetric prominence of the ventricles, cisterns and sulci compatible with parenchymal volume loss. Patchy areas of white matter hypoattenuation are most compatible with chronic microvascular angiopathy. Vascular: Atherosclerotic calcification of the carotid siphons and intradural vertebral arteries. No hyperdense vessel. Skull: Right frontal scalp swelling and thin crescentic hematoma measuring up to 5 mm in maximal thickness. No subjacent calvarial fracture. Other: None CT MAXILLOFACIAL FINDINGS Osseous: No fracture of the bony orbits. Prior left nasal bone fracture which appears corticated. No other mid face fractures are seen. The pterygoid plates are intact. The mandible is intact. Temporomandibular joints are normally aligned with mild arthrosis. No temporal bone fractures are identified. No  fractured or avulsed teeth. Multiple absent dentition. Several orthotic dental implants with resultant streak artifact. Carious lesions of the bilateral mandibular central and lateral incisors. Orbits: Soft tissue swelling extends to the palpebra. No retro septal fat stranding or gas. Prior bilateral lens extractions. The globes appear otherwise normal and symmetric. Symmetric appearance of the extraocular musculature and optic nerve sheath complexes. Normal caliber of the superior ophthalmic veins. Sinuses: Nodular mural thickening throughout the maxillary sinuses and minimally within the ethmoids. No air-fluid levels. Mastoid air cells are clear. Middle ear cavities are clear. Ossicular chains are normal configuration Soft tissues: Frontal scalp soft tissue swelling extends inferiorly across the periorbital soft tissues and into the right malar soft tissues as well. There are stable soft tissue calcifications along the right temporal bone. CT CERVICAL SPINE FINDINGS Alignment: Reversal of the normal cervical lordosis centered at C5. Mild degenerative stepwise anterolisthesis of C2-C4. No traumatic listhesis. Normal alignment of the posterior elements. No abnormally widened facets. Craniocervical and toe axial articulations are normal configuration. Skull base and vertebrae: No acute osseous or soft tissue abnormality. Mild bony demineralization is noted Soft tissues and spinal canal: No pre or paravertebral fluid or swelling. No visible canal hematoma. Disc levels: Multilevel intervertebral disc height loss with spondylitic endplate changes. Features are maximal at C4-5 and C5-6 where posterior disc osteophyte complexes result in mild-to-moderate canal stenosis. Multilevel uncinate spurring and facet hypertrophic changes are present as well with mild to moderate multilevel foraminal narrowing and a more severe narrowing noted on the right at C4-5. Upper chest: No acute abnormality in the upper chest or imaged lung  apices. Other: Heterogeneously nodular and enlarged thyroid predominantly involving the left lobe thyroid gland. Several partially  calcified nodules are present as well. Cervical carotid artery atherosclerosis is noted. IMPRESSION: 1. No acute intracranial abnormality. Stable atrophy and chronic microvascular ischemic white matter disease. 2. Right frontal scalp swelling and thin crescentic hematoma without subjacent calvarial fracture. 3. Periorbital and right malar soft tissue swelling noted as well. 4. A left nasal bone fracture has a remote appearance. Could correlate for point tenderness. No other acute facial fractures identified. 5. Soft tissue swelling extends to the right palpebra. No orbital injury is identified. 6. Periodontal disease and temporomandibular joint arthrosis. No acute cervical spine fracture. Multilevel degenerative changes throughout the cervical spine. Maximal C4-5 and C5-6, as detailed above. 7. Heterogeneously nodular and enlarged thyroid predominantly involving the left lobe thyroid gland. Consider further evaluation with thyroid ultrasound. This follows consensus guidelines: Managing Incidental Thyroid Nodules Detected on Imaging: White Paper of the ACR Incidental Thyroid Findings Committee. J Am Coll Radiol 2015; 12:143-150. and [facility] 3-tiered system for managing ITNs: J Am Coll Radiol. 2015; Feb;12(2): 143-50 Electronically Signed   By: Lovena Le M.D.   On: 08/05/2019 14:50    ASSESSMENT: Pancytopenia, possibly underlying MDS.  PLAN:    1. Pancytopenia: Patient's white blood cell count, hemoglobin, and platelets remain decreased, but essentially stable.  All of her other laboratory work including iron stores, B12, folate, hemolysis lab are all within normal limits.  She has an inappropriately normal reticulocyte count.  Peripheral blood flow cytometry, antiplatelet antibodies, antineutrophil antibodies, and SPEP are all negative.  Peripheral blood myeloid panel is pending at  time of dictation.  Suspect underlying MDS, but this would take a bone marrow biopsy to diagnose which is not necessary at this time and patient is refusing.  No intervention is needed at this time.  Return to clinic in 3 months with repeat laboratory work and video assisted telemedicine visit.    Patient expressed understanding and was in agreement with this plan. She also understands that She can call clinic at any time with any questions, concerns, or complaints.   I provided 25 minutes of face-to-face video visit time during this encounter, and > 50% was spent counseling as documented under my assessment & plan.    Lloyd Huger, MD   08/21/2019 8:51 AM

## 2019-08-16 ENCOUNTER — Ambulatory Visit: Payer: Medicare Other | Admitting: Oncology

## 2019-08-19 ENCOUNTER — Encounter: Payer: Self-pay | Admitting: Oncology

## 2019-08-19 ENCOUNTER — Other Ambulatory Visit: Payer: Self-pay

## 2019-08-19 DIAGNOSIS — M159 Polyosteoarthritis, unspecified: Secondary | ICD-10-CM | POA: Insufficient documentation

## 2019-08-19 DIAGNOSIS — E785 Hyperlipidemia, unspecified: Secondary | ICD-10-CM | POA: Insufficient documentation

## 2019-08-19 DIAGNOSIS — I059 Rheumatic mitral valve disease, unspecified: Secondary | ICD-10-CM | POA: Insufficient documentation

## 2019-08-19 DIAGNOSIS — K5790 Diverticulosis of intestine, part unspecified, without perforation or abscess without bleeding: Secondary | ICD-10-CM | POA: Insufficient documentation

## 2019-08-19 DIAGNOSIS — E039 Hypothyroidism, unspecified: Secondary | ICD-10-CM | POA: Insufficient documentation

## 2019-08-19 DIAGNOSIS — J329 Chronic sinusitis, unspecified: Secondary | ICD-10-CM | POA: Insufficient documentation

## 2019-08-19 DIAGNOSIS — S52509A Unspecified fracture of the lower end of unspecified radius, initial encounter for closed fracture: Secondary | ICD-10-CM | POA: Insufficient documentation

## 2019-08-19 DIAGNOSIS — K589 Irritable bowel syndrome without diarrhea: Secondary | ICD-10-CM | POA: Insufficient documentation

## 2019-08-19 DIAGNOSIS — E119 Type 2 diabetes mellitus without complications: Secondary | ICD-10-CM | POA: Insufficient documentation

## 2019-08-19 NOTE — Progress Notes (Signed)
Patient prescreened for appointment. Patient has no concerns or questions.  

## 2019-08-20 ENCOUNTER — Inpatient Hospital Stay: Payer: Medicare Other | Attending: Oncology | Admitting: Oncology

## 2019-08-20 DIAGNOSIS — D61818 Other pancytopenia: Secondary | ICD-10-CM | POA: Diagnosis not present

## 2019-08-26 LAB — MISC LABCORP TEST (SEND OUT): Labcorp test code: 451953

## 2019-09-10 ENCOUNTER — Emergency Department: Payer: Medicare Other

## 2019-09-10 ENCOUNTER — Encounter: Payer: Self-pay | Admitting: Emergency Medicine

## 2019-09-10 ENCOUNTER — Inpatient Hospital Stay
Admission: EM | Admit: 2019-09-10 | Discharge: 2019-09-17 | DRG: 291 | Disposition: E | Payer: Medicare Other | Attending: Internal Medicine | Admitting: Internal Medicine

## 2019-09-10 ENCOUNTER — Other Ambulatory Visit: Payer: Self-pay

## 2019-09-10 DIAGNOSIS — Z8249 Family history of ischemic heart disease and other diseases of the circulatory system: Secondary | ICD-10-CM | POA: Diagnosis not present

## 2019-09-10 DIAGNOSIS — Z20828 Contact with and (suspected) exposure to other viral communicable diseases: Secondary | ICD-10-CM | POA: Diagnosis present

## 2019-09-10 DIAGNOSIS — I110 Hypertensive heart disease with heart failure: Secondary | ICD-10-CM | POA: Diagnosis present

## 2019-09-10 DIAGNOSIS — E785 Hyperlipidemia, unspecified: Secondary | ICD-10-CM | POA: Diagnosis present

## 2019-09-10 DIAGNOSIS — J189 Pneumonia, unspecified organism: Secondary | ICD-10-CM | POA: Diagnosis present

## 2019-09-10 DIAGNOSIS — Z7982 Long term (current) use of aspirin: Secondary | ICD-10-CM | POA: Diagnosis not present

## 2019-09-10 DIAGNOSIS — K219 Gastro-esophageal reflux disease without esophagitis: Secondary | ICD-10-CM | POA: Diagnosis present

## 2019-09-10 DIAGNOSIS — J9621 Acute and chronic respiratory failure with hypoxia: Secondary | ICD-10-CM | POA: Diagnosis present

## 2019-09-10 DIAGNOSIS — Z9981 Dependence on supplemental oxygen: Secondary | ICD-10-CM | POA: Diagnosis not present

## 2019-09-10 DIAGNOSIS — Z90710 Acquired absence of both cervix and uterus: Secondary | ICD-10-CM | POA: Diagnosis not present

## 2019-09-10 DIAGNOSIS — E872 Acidosis: Secondary | ICD-10-CM | POA: Diagnosis present

## 2019-09-10 DIAGNOSIS — I350 Nonrheumatic aortic (valve) stenosis: Secondary | ICD-10-CM | POA: Diagnosis present

## 2019-09-10 DIAGNOSIS — I2510 Atherosclerotic heart disease of native coronary artery without angina pectoris: Secondary | ICD-10-CM | POA: Diagnosis present

## 2019-09-10 DIAGNOSIS — N179 Acute kidney failure, unspecified: Secondary | ICD-10-CM | POA: Diagnosis present

## 2019-09-10 DIAGNOSIS — E039 Hypothyroidism, unspecified: Secondary | ICD-10-CM | POA: Diagnosis present

## 2019-09-10 DIAGNOSIS — Z66 Do not resuscitate: Secondary | ICD-10-CM | POA: Diagnosis present

## 2019-09-10 DIAGNOSIS — I248 Other forms of acute ischemic heart disease: Secondary | ICD-10-CM | POA: Diagnosis present

## 2019-09-10 DIAGNOSIS — D473 Essential (hemorrhagic) thrombocythemia: Secondary | ICD-10-CM

## 2019-09-10 DIAGNOSIS — E1140 Type 2 diabetes mellitus with diabetic neuropathy, unspecified: Secondary | ICD-10-CM | POA: Diagnosis present

## 2019-09-10 DIAGNOSIS — R0602 Shortness of breath: Secondary | ICD-10-CM

## 2019-09-10 DIAGNOSIS — D61818 Other pancytopenia: Secondary | ICD-10-CM

## 2019-09-10 DIAGNOSIS — R110 Nausea: Secondary | ICD-10-CM | POA: Diagnosis not present

## 2019-09-10 DIAGNOSIS — Z79899 Other long term (current) drug therapy: Secondary | ICD-10-CM

## 2019-09-10 DIAGNOSIS — I5023 Acute on chronic systolic (congestive) heart failure: Secondary | ICD-10-CM

## 2019-09-10 DIAGNOSIS — R0902 Hypoxemia: Secondary | ICD-10-CM

## 2019-09-10 DIAGNOSIS — D75839 Thrombocytosis, unspecified: Secondary | ICD-10-CM

## 2019-09-10 LAB — TROPONIN I (HIGH SENSITIVITY)
Troponin I (High Sensitivity): 20 ng/L — ABNORMAL HIGH (ref <18)
Troponin I (High Sensitivity): 36 ng/L — ABNORMAL HIGH (ref <18)

## 2019-09-10 LAB — URINALYSIS, COMPLETE (UACMP) WITH MICROSCOPIC
Bilirubin Urine: NEGATIVE
Glucose, UA: NEGATIVE mg/dL
Ketones, ur: 5 mg/dL — AB
Nitrite: NEGATIVE
Protein, ur: 30 mg/dL — AB
RBC / HPF: >50 RBC/hpf — ABNORMAL HIGH (ref 0–5)
Specific Gravity, Urine: 1.044 — ABNORMAL HIGH (ref 1.005–1.030)
pH: 5 (ref 5.0–8.0)

## 2019-09-10 LAB — BASIC METABOLIC PANEL
Anion gap: 11 (ref 5–15)
BUN: 26 mg/dL — ABNORMAL HIGH (ref 8–23)
CO2: 27 mmol/L (ref 22–32)
Calcium: 8.8 mg/dL — ABNORMAL LOW (ref 8.9–10.3)
Chloride: 100 mmol/L (ref 98–111)
Creatinine, Ser: 0.75 mg/dL (ref 0.44–1.00)
GFR calc Af Amer: >60 mL/min (ref >60)
GFR calc non Af Amer: >60 mL/min (ref >60)
Glucose, Bld: 178 mg/dL — ABNORMAL HIGH (ref 70–99)
Potassium: 4.1 mmol/L (ref 3.5–5.1)
Sodium: 138 mmol/L (ref 135–145)

## 2019-09-10 LAB — CBC
HCT: 28.3 % — ABNORMAL LOW (ref 36.0–46.0)
Hemoglobin: 9.2 g/dL — ABNORMAL LOW (ref 12.0–15.0)
MCH: 30.3 pg (ref 26.0–34.0)
MCHC: 32.5 g/dL (ref 30.0–36.0)
MCV: 93.1 fL (ref 80.0–100.0)
Platelets: 29 10*3/uL — CL (ref 150–400)
RBC: 3.04 MIL/uL — ABNORMAL LOW (ref 3.87–5.11)
RDW: 15.6 % — ABNORMAL HIGH (ref 11.5–15.5)
WBC: 2.1 10*3/uL — ABNORMAL LOW (ref 4.0–10.5)
nRBC: 1.9 % — ABNORMAL HIGH (ref 0.0–0.2)

## 2019-09-10 LAB — MRSA PCR SCREENING: MRSA by PCR: NEGATIVE

## 2019-09-10 LAB — POC SARS CORONAVIRUS 2 AG -  ED: SARS Coronavirus 2 Ag: NEGATIVE

## 2019-09-10 LAB — RESPIRATORY PANEL BY RT PCR (FLU A&B, COVID)
Influenza A by PCR: NEGATIVE
Influenza B by PCR: NEGATIVE
SARS Coronavirus 2 by RT PCR: NEGATIVE

## 2019-09-10 LAB — BRAIN NATRIURETIC PEPTIDE: B Natriuretic Peptide: 379 pg/mL — ABNORMAL HIGH (ref 0.0–100.0)

## 2019-09-10 MED ORDER — LORATADINE 10 MG PO TABS: 10.0000 mg | ORAL_TABLET | Freq: Every day | ORAL | Status: DC

## 2019-09-10 MED ORDER — SODIUM CHLORIDE 0.9 % IV SOLN
250.0000 mL | INTRAVENOUS | Status: DC | PRN
  Administered 2019-09-10: 21:00:00 250 mL via INTRAVENOUS

## 2019-09-10 MED ORDER — PANTOPRAZOLE SODIUM 40 MG PO TBEC: 40.0000 mg | DELAYED_RELEASE_TABLET | Freq: Every day | ORAL | Status: DC

## 2019-09-10 MED ORDER — GABAPENTIN 300 MG PO CAPS
300.0000 mg | ORAL_CAPSULE | Freq: Three times a day (TID) | ORAL | Status: DC
  Administered 2019-09-10: 300 mg via ORAL
  Filled 2019-09-10: qty 1

## 2019-09-10 MED ORDER — ASPIRIN EC 81 MG PO TBEC: 81.0000 mg | DELAYED_RELEASE_TABLET | Freq: Every day | ORAL | Status: DC

## 2019-09-10 MED ORDER — ACETAMINOPHEN 325 MG PO TABS: 650.0000 mg | ORAL_TABLET | Freq: Four times a day (QID) | ORAL | Status: DC | PRN

## 2019-09-10 MED ORDER — ONDANSETRON HCL 4 MG/2ML IJ SOLN
4.0000 mg | Freq: Four times a day (QID) | INTRAMUSCULAR | Status: DC | PRN
  Administered 2019-09-10 – 2019-09-11 (×2): 4 mg via INTRAVENOUS
  Filled 2019-09-10 (×2): qty 2

## 2019-09-10 MED ORDER — SODIUM CHLORIDE 0.9% FLUSH
3.0000 mL | Freq: Two times a day (BID) | INTRAVENOUS | Status: DC
  Administered 2019-09-10 – 2019-09-11 (×2): 3 mL via INTRAVENOUS

## 2019-09-10 MED ORDER — SODIUM CHLORIDE 0.9 % IV BOLUS
500.0000 mL | Freq: Once | INTRAVENOUS | Status: AC
  Administered 2019-09-10: 500 mL via INTRAVENOUS

## 2019-09-10 MED ORDER — IPRATROPIUM-ALBUTEROL 0.5-2.5 (3) MG/3ML IN SOLN
3.0000 mL | Freq: Once | RESPIRATORY_TRACT | Status: AC
  Administered 2019-09-10: 14:00:00 3 mL via RESPIRATORY_TRACT

## 2019-09-10 MED ORDER — ONDANSETRON HCL 4 MG/2ML IJ SOLN: 4.0000 mg | Freq: Four times a day (QID) | INTRAMUSCULAR | Status: DC

## 2019-09-10 MED ORDER — FERROUS SULFATE 325 (65 FE) MG PO TABS
325.0000 mg | ORAL_TABLET | Freq: Every day | ORAL | Status: DC
  Filled 2019-09-10 (×2): qty 1

## 2019-09-10 MED ORDER — SODIUM CHLORIDE 0.9 % IV SOLN
2.0000 g | INTRAVENOUS | Status: DC
  Administered 2019-09-10: 2 g via INTRAVENOUS
  Filled 2019-09-10: qty 20
  Filled 2019-09-10: qty 2

## 2019-09-10 MED ORDER — ONDANSETRON HCL 4 MG/2ML IJ SOLN
4.0000 mg | Freq: Once | INTRAMUSCULAR | Status: AC
  Administered 2019-09-10: 4 mg via INTRAVENOUS

## 2019-09-10 MED ORDER — SODIUM CHLORIDE 0.9% FLUSH: 3.0000 mL | INTRAVENOUS | Status: DC | PRN

## 2019-09-10 MED ORDER — ALBUTEROL SULFATE HFA 108 (90 BASE) MCG/ACT IN AERS
2.0000 | INHALATION_SPRAY | Freq: Four times a day (QID) | RESPIRATORY_TRACT | Status: DC | PRN
  Filled 2019-09-10: qty 6.7

## 2019-09-10 MED ORDER — METOPROLOL TARTRATE 25 MG PO TABS
12.5000 mg | ORAL_TABLET | Freq: Four times a day (QID) | ORAL | Status: DC
  Administered 2019-09-10 – 2019-09-11 (×2): 12.5 mg via ORAL
  Filled 2019-09-10 (×2): qty 1

## 2019-09-10 MED ORDER — FUROSEMIDE 40 MG PO TABS: 20.0000 mg | ORAL_TABLET | Freq: Two times a day (BID) | ORAL | Status: DC

## 2019-09-10 MED ORDER — ONDANSETRON HCL 4 MG/2ML IJ SOLN
INTRAMUSCULAR | Status: AC
  Filled 2019-09-10: qty 2

## 2019-09-10 MED ORDER — METOPROLOL TARTRATE 25 MG PO TABS: 25.0000 mg | ORAL_TABLET | Freq: Two times a day (BID) | ORAL | Status: DC

## 2019-09-10 MED ORDER — AMIODARONE HCL 200 MG PO TABS
100.0000 mg | ORAL_TABLET | Freq: Every day | ORAL | Status: DC
  Administered 2019-09-10: 19:00:00 100 mg via ORAL
  Filled 2019-09-10 (×2): qty 1

## 2019-09-10 MED ORDER — IPRATROPIUM-ALBUTEROL 0.5-2.5 (3) MG/3ML IN SOLN
3.0000 mL | Freq: Once | RESPIRATORY_TRACT | Status: AC
  Administered 2019-09-10: 3 mL via RESPIRATORY_TRACT

## 2019-09-10 MED ORDER — SPIRONOLACTONE 25 MG PO TABS
12.5000 mg | ORAL_TABLET | Freq: Every day | ORAL | Status: DC
  Administered 2019-09-10: 12.5 mg via ORAL
  Filled 2019-09-10 (×2): qty 0.5
  Filled 2019-09-10: qty 1

## 2019-09-10 MED ORDER — IPRATROPIUM-ALBUTEROL 0.5-2.5 (3) MG/3ML IN SOLN
3.0000 mL | Freq: Once | RESPIRATORY_TRACT | Status: AC
  Administered 2019-09-10: 14:00:00 3 mL via RESPIRATORY_TRACT
  Filled 2019-09-10: qty 9

## 2019-09-10 MED ORDER — POLYETHYLENE GLYCOL 3350 17 G PO PACK: 17.0000 g | PACK | Freq: Every day | ORAL | Status: DC | PRN

## 2019-09-10 MED ORDER — FUROSEMIDE 10 MG/ML IJ SOLN
20.0000 mg | Freq: Once | INTRAMUSCULAR | Status: AC
  Administered 2019-09-10: 20 mg via INTRAVENOUS
  Filled 2019-09-10: qty 2

## 2019-09-10 MED ORDER — SODIUM CHLORIDE 0.9 % IV SOLN
500.0000 mg | INTRAVENOUS | Status: DC
  Administered 2019-09-10: 22:00:00 500 mg via INTRAVENOUS
  Filled 2019-09-10 (×2): qty 500

## 2019-09-10 MED ORDER — IOHEXOL 350 MG/ML SOLN
75.0000 mL | Freq: Once | INTRAVENOUS | Status: AC | PRN
  Administered 2019-09-10: 16:00:00 75 mL via INTRAVENOUS

## 2019-09-10 NOTE — ED Notes (Signed)
Pt currently requiring 4L nasal cannula. MD Siadecki aware. Oxygen saturation 100% on 4L

## 2019-09-10 NOTE — ED Notes (Addendum)
Daughter, Otho Perl, contacted to inform family of admission of pt. Pt daughter luann answered phone when this RN contacted her and immediately began raising her voice at this RN. This RN informed family member that she was giving an update on pt status. Family member upset that pt is being admitted. Pt was given the choice for admission and seen by both an EDP and admission doctor. Pt verbally consented to being admitted. Family was explained this. Pt family member luann still yelling at this RN on phone. This RN informed family that pt would be able to leave AMA if they contacted the floor as the pt has already been moved to the floor. Pt showed no signs of confusion while in the ED and competent to make medical decisions. Pt family yelling at this RN "why would you give someone who is dehydrated coffee. That is why she is sick. You dont give a dehydrated pt coffee". This RN explained to pt family that MD reviewed labs and verbally consented to giving pt coffee after pt had been asking for coffee multiple times. This RN informed family that pt verbally stated "I want to be admitted, I don't feel like I can go home". Family still upset. This RN informed family the number to call the inpatient floor, and that they could facilitate the pt leaving against medical advice.

## 2019-09-10 NOTE — H&P (Addendum)
History and Physical    Jessica Blackwell DOB: 08-29-1925 DOA: 08/30/2019  PCP: Idelle Crouch, MD  Patient coming from: Home   Chief Complaint: "stomach upset and sob"  HPI: Jessica Blackwell is a 85 y.o. female with medical history significant of pancytopenia, CHF, aortic stenosis, GERD, hypertension presents in the ED complaining of upset stomach and shortness of breath.  She reports for the past few days she has had "upset stomach" described as nausea but not able to vomit.  She also states she is more short of breath.  She is on home O2 2 L but unable to tell me why.  Denies abdominal pain, chest pain, fever, chills or vomiting.Currently she is asking for something to drink.Appears sob when talks.  ED Course: In the ED she apparently desatted and required 3 L at 85%.  She had a CT angio that was negative for PE.  Her Covid and respiratory panel were negative.  Her influenza by PCR were negative.  Troponin and BMP pending.  Review of Systems: All systems reviewed and otherwise negative.    Past Medical History:  Diagnosis Date  . Aortic stenosis   . Arthritis   . CHF (congestive heart failure) ([place])   . Fall at home 07/28/2015  . GERD (gastroesophageal reflux disease)   . H/O bladder infections   . Hypertension   . Neuropathy    lower extrmities    Past Surgical History:  Procedure Laterality Date  . ABDOMINAL HYSTERECTOMY    . CATARACT EXTRACTION    . COLON SURGERY    . ESOPHAGOGASTRODUODENOSCOPY N/A 05/22/2018   Procedure: ESOPHAGOGASTRODUODENOSCOPY (EGD);  Surgeon: Virgel Manifold, MD;  Location: [facility] ENDOSCOPY;  Service: Endoscopy;  Laterality: N/A;  . EYE SURGERY    . FOOT SURGERY Right   . ROTATOR CUFF REPAIR Left   . TONSILLECTOMY       reports that she has never smoked. She has never used smokeless tobacco. She reports that she does not drink alcohol or use drugs.  Allergies  Allergen Reactions  . Ciprofloxacin Itching and Rash  . Codeine  Other (See Comments)    Upset stomach  . Sulfamethoxazole-Trimethoprim Rash    Family History  Problem Relation Age of Onset  . Hypertension Father   . CAD Mother      Prior to Admission medications   Medication Sig Start Date End Date Taking? Authorizing Provider  acetaminophen (TYLENOL) 325 MG tablet Take 2 tablets (650 mg total) by mouth every 6 (six) hours as needed for mild pain (or Fever >/= 101). 05/26/18   Gouru, Illene Silver, MD  albuterol (VENTOLIN HFA) 108 (90 Base) MCG/ACT inhaler Inhale 2 puffs into the lungs every 6 (six) hours as needed for wheezing or shortness of breath. Patient not taking: Reported on 08/02/2019 03/28/19   Nicholes Mango, MD  amiodarone (PACERONE) 200 MG tablet Take 0.5 tablets (100 mg total) by mouth daily. 04/09/19   Loletha Grayer, MD  aspirin EC 81 MG tablet Take 81 mg by mouth daily.    [provider]  doxycycline (VIBRA-TABS) 100 MG tablet Take 100 mg by mouth 2 (two) times daily. For 10 days 09/07/19 09/17/19  [provider]  ferrous sulfate 325 (65 FE) MG tablet Take 325 mg by mouth daily.    [provider]  fexofenadine (ALLEGRA) 60 MG tablet Take 60 mg by mouth daily. Pt daughter said pt takes this as needed, nurse home doing it daily    [provider]  furosemide (LASIX) 20 MG tablet Take 20 mg by mouth 2 (two) times daily. For Congestive Heart Failure 09/01/19   [provider]  gabapentin (NEURONTIN) 300 MG capsule Take 300 mg by mouth 3 (three) times daily.    [provider]  metoprolol tartrate (LOPRESSOR) 25 MG tablet Take 25 mg by mouth 2 (two) times daily. 09/01/19   [provider]  pantoprazole (PROTONIX) 40 MG tablet Take 1 tablet (40 mg total) by mouth daily. 04/09/19   Loletha Grayer, MD  polyethylene glycol (MIRALAX / GLYCOLAX) 17 g packet Take 17 g by mouth daily as needed for severe constipation. 04/09/19   Loletha Grayer, MD  spironolactone (ALDACTONE) 25 MG tablet Take  12.5 mg by mouth daily.    [provider]    Physical Exam: Vitals:   09/09/2019 1300 09/05/2019 1330 08/19/2019 1400 09/16/2019 1430  BP: (!) 82/72 120/63 (!) 133/54 (!) 118/91  Pulse:  84 89   Resp: (!) 29 (!) 27 (!) 34   Temp:      TempSrc:      SpO2:  95% 100% 90%  Weight:      Height:        Constitutional: NAD, calm, comfortable Vitals:   08/19/2019 1300 09/04/2019 1330 08/22/2019 1400 09/06/2019 1430  BP: (!) 82/72 120/63 (!) 133/54 (!) 118/91  Pulse:  84 89   Resp: (!) 29 (!) 27 (!) 34   Temp:      TempSrc:      SpO2:  95% 100% 90%  Weight:      Height:       Eyes: EOMI lids and conjunctivae normal ENMT: on [place], mask on Neck: normal, supple, no masses Respiratory: b/l rales , R>L, up mid way, no wheezing or rhonchi Cardiovascular:Tachycardic, regular, 2/6 semi-harsh SM  Abdomen: soft , NT/ND +bS Musculoskeletal: no clubbing. Foot cool to touch, chronic skin changes. Faint pulses b/l +toe joint deformty . Skin: warm, dry Neurologic: CN 2-12 grossly intact. [id number] Psychiatric: Normal judgment and insight. Alert and oriented x 3. Normal mood.    Labs on Admission: I have personally reviewed following labs and imaging studies  CBC: Recent Labs  Lab 09/09/2019 1113  WBC 2.1*  HGB 9.2*  HCT 28.3*  MCV 93.1  PLT 29*   Basic Metabolic Panel: Recent Labs  Lab 09/15/2019 1113  NA 138  K 4.1  CL 100  CO2 27  GLUCOSE 178*  BUN 26*  CREATININE 0.75  CALCIUM 8.8*   GFR: Estimated Creatinine Clearance: 40.4 mL/min (by C-G formula based on SCr of 0.75 mg/dL). Liver Function Tests: No results for input(s): AST, ALT, ALKPHOS, BILITOT, PROT, ALBUMIN in the last 168 hours. No results for input(s): LIPASE, AMYLASE in the last 168 hours. No results for input(s): AMMONIA in the last 168 hours. Coagulation Profile: No results for input(s): INR, PROTIME in the last 168 hours. Cardiac Enzymes: No results for input(s): CKTOTAL, CKMB, CKMBINDEX, TROPONINI in the last 168  hours. BNP (last 3 results) No results for input(s): PROBNP in the last 8760 hours. HbA1C: No results for input(s): HGBA1C in the last 72 hours. CBG: No results for input(s): GLUCAP in the last 168 hours. Lipid Profile: No results for input(s): CHOL, HDL, LDLCALC, TRIG, CHOLHDL, LDLDIRECT in the last 72 hours. Thyroid Function Tests: No results for input(s): TSH, T4TOTAL, FREET4, T3FREE, THYROIDAB in the last 72 hours. Anemia Panel: No results for input(s): VITAMINB12, FOLATE, FERRITIN, TIBC, IRON, RETICCTPCT in the last  72 hours. Urine analysis:    Component Value Date/Time   COLORURINE YELLOW (A) 05/02/2019 1235   APPEARANCEUR CLEAR (A) 05/02/2019 1235   APPEARANCEUR Hazy 06/02/2012 2035   LABSPEC 1.010 05/02/2019 1235   LABSPEC 1.012 06/02/2012 2035   PHURINE 6.0 05/02/2019 1235   GLUCOSEU NEGATIVE 05/02/2019 1235   GLUCOSEU Negative 06/02/2012 2035   HGBUR SMALL (A) 05/02/2019 1235   BILIRUBINUR NEGATIVE 05/02/2019 1235   BILIRUBINUR Negative 06/02/2012 2035   KETONESUR NEGATIVE 05/02/2019 1235   PROTEINUR NEGATIVE 05/02/2019 1235   UROBILINOGEN 0.2 07/28/2015 1205   NITRITE POSITIVE (A) 05/02/2019 1235   LEUKOCYTESUR NEGATIVE 05/02/2019 1235   LEUKOCYTESUR Trace 06/02/2012 2035    Radiological Exams on Admission: CT Angio Chest PE W and/or Wo Contrast  Result Date: 09/14/2019 CLINICAL DATA:  Progressive shortness of breath for 1 week with abdominal pain and nausea. EXAM: CT ANGIOGRAPHY CHEST CT ABDOMEN AND PELVIS WITH CONTRAST TECHNIQUE: Multidetector CT imaging of the chest was performed using the standard protocol during bolus administration of intravenous contrast. Multiplanar CT image reconstructions and MIPs were obtained to evaluate the vascular anatomy. Multidetector CT imaging of the abdomen and pelvis was performed using the standard protocol during bolus administration of intravenous contrast. CONTRAST:  76mL OMNIPAQUE IOHEXOL 350 MG/ML SOLN COMPARISON:  Chest CT  05/06/2019. Abdominopelvic CT 05/20/2018. FINDINGS: CTA CHEST FINDINGS Cardiovascular: The pulmonary arteries are well opacified with contrast to the level of the subsegmental branches. There is no evidence of acute pulmonary embolism. 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 This is similar to the previous examination. There is central enlargement of the pulmonary arteries. There is atherosclerosis of the aorta, great vessels and coronary arteries. Aortic valvular calcifications are present. There is stable cardiomegaly without pericardial effusion. Mediastinum/Nodes: There are no enlarged mediastinal, hilar or axillary lymph nodes. Stable asymmetric goiter involving the left thyroid lobe. The trachea and esophagus demonstrate no significant findings. Lungs/Pleura: No pleural effusion or pneumothorax. Mild emphysema. Low-density pleural based mass adjacent to the left lower lobe is unchanged, measuring 3.7 x 2.7 cm on image 42/5. There is mildly increased atelectasis at both lung bases. Musculoskeletal/Chest wall: The bones are demineralized. There is a new superior endplate compression deformity involving the T4 vertebral body. No other acute osseous findings. CT ABDOMEN AND PELVIS FINDINGS Hepatobiliary: The liver is normal in density without suspicious focal abnormality. There are multiple small layering gallstones. No gallbladder wall thickening or biliary dilatation. Pancreas: Unremarkable. No pancreatic ductal dilatation or surrounding inflammatory changes. Spleen: Normal in size without focal abnormality. Adrenals/Urinary Tract: Both adrenal glands appear normal. Numerous nonobstructing right renal calculi are again noted. 1 of these calculi has moved into the right renal pelvis, measuring 6 mm on image 38/3. No evidence of ureteral calculus or hydronephrosis. A mildly complex left renal cyst measuring 5.5 cm on image 33/3 is unchanged in  size. This demonstrates thin mural calcifications. There are additional simple renal cysts bilaterally. In addition, there are 2 indeterminate low-density lesions in the right kidney which are ill-defined. In the upper pole, there is a lesion measuring 2.3 x 1.5 cm (image 9/2), and in the lower pole, there is a lesion measuring 2.6 x 12.1 cm on image 16/2. These are indeterminate for masses versus focal inflammation. The bladder appears normal. Stomach/Bowel: No evidence of bowel wall thickening, distention or surrounding inflammatory change. Duodenal diverticulum noted. Probable previous appendectomy. Distal colonic anastomosis appears unchanged. Vascular/Lymphatic:  There are no enlarged abdominal or pelvic lymph nodes. Small retroperitoneal lymph nodes are stable. There is aortic and branch vessel atherosclerosis without acute vascular findings. Reproductive: Hysterectomy. No adnexal mass. Mild pelvic floor laxity. Other: There is diffuse laxity of the anterior abdominal wall with a small periumbilical hernia containing a knuckle of small bowel (image 52/3). No evidence of incarceration or obstruction. Musculoskeletal: No acute or significant osseous findings. Diffuse degenerative changes throughout the thoracolumbar spine associated with a convex left scoliosis. Review of the MIP images confirms the above findings. IMPRESSION: 1. No evidence of acute pulmonary embolism. 2. 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 Central enlargement of the pulmonary arteries consistent with chronic pulmonary arterial hypertension. 3. New superior endplate compression deformity at T4. 4. Stable low-density pleural based mass adjacent to the left lower lobe, likely a benign etiology. 5. Nonobstructing right renal calculi. One of these calculi has moved into the right renal pelvis. No evidence of ureteral calculus or hydronephrosis. 6. Two indeterminate low-density lesions in the right kidney. These could reflect masses  or focal inflammation. Correlation with urine analysis recommended. Dedicated renal CT or MRI (without and with contrast) follow up recommended (after antibiotics if clinically warranted). 7. Cholelithiasis, pelvic floor laxity and small periumbilical hernia containing a knuckle of small bowel. 8. Aortic Atherosclerosis (ICD10-I70.0). Electronically Signed   By: Richardean Sale M.D.   On: 09/07/2019 16:39   CT ABDOMEN PELVIS W CONTRAST  Result Date: 08/24/2019 CLINICAL DATA:  Progressive shortness of breath for 1 week with abdominal pain and nausea. EXAM: CT ANGIOGRAPHY CHEST CT ABDOMEN AND PELVIS WITH CONTRAST TECHNIQUE: Multidetector CT imaging of the chest was performed using the standard protocol during bolus administration of intravenous contrast. Multiplanar CT image reconstructions and MIPs were obtained to evaluate the vascular anatomy. Multidetector CT imaging of the abdomen and pelvis was performed using the standard protocol during bolus administration of intravenous contrast. CONTRAST:  35mL OMNIPAQUE IOHEXOL 350 MG/ML SOLN COMPARISON:  Chest CT 05/06/2019. Abdominopelvic CT 05/20/2018. FINDINGS: CTA CHEST FINDINGS Cardiovascular: The pulmonary arteries are well opacified with contrast to the level of the subsegmental branches. There is no evidence of acute pulmonary embolism. 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 This is similar to the previous examination. There is central enlargement of the pulmonary arteries. There is atherosclerosis of the aorta, great vessels and coronary arteries. Aortic valvular calcifications are present. There is stable cardiomegaly without pericardial effusion. Mediastinum/Nodes: There are no enlarged mediastinal, hilar or axillary lymph nodes. Stable asymmetric goiter involving the left thyroid lobe. The trachea and esophagus demonstrate no significant findings. Lungs/Pleura: No pleural effusion or  pneumothorax. Mild emphysema. Low-density pleural based mass adjacent to the left lower lobe is unchanged, measuring 3.7 x 2.7 cm on image 42/5. There is mildly increased atelectasis at both lung bases. Musculoskeletal/Chest wall: The bones are demineralized. There is a new superior endplate compression deformity involving the T4 vertebral body. No other acute osseous findings. CT ABDOMEN AND PELVIS FINDINGS Hepatobiliary: The liver is normal in density without suspicious focal abnormality. There are multiple small layering gallstones. No gallbladder wall thickening or biliary dilatation. Pancreas: Unremarkable. No pancreatic ductal dilatation or surrounding inflammatory changes. Spleen: Normal in size without focal abnormality. Adrenals/Urinary Tract: Both adrenal glands appear normal. Numerous nonobstructing right renal calculi are again noted. 1 of these calculi has moved into the right renal pelvis, measuring 6 mm on image 38/3. No evidence  of ureteral calculus or hydronephrosis. A mildly complex left renal cyst measuring 5.5 cm on image 33/3 is unchanged in size. This demonstrates thin mural calcifications. There are additional simple renal cysts bilaterally. In addition, there are 2 indeterminate low-density lesions in the right kidney which are ill-defined. In the upper pole, there is a lesion measuring 2.3 x 1.5 cm (image 9/2), and in the lower pole, there is a lesion measuring 2.6 x 12.1 cm on image 16/2. These are indeterminate for masses versus focal inflammation. The bladder appears normal. Stomach/Bowel: No evidence of bowel wall thickening, distention or surrounding inflammatory change. Duodenal diverticulum noted. Probable previous appendectomy. Distal colonic anastomosis appears unchanged. Vascular/Lymphatic: There are no enlarged abdominal or pelvic lymph nodes. Small retroperitoneal lymph nodes are stable. There is aortic and branch vessel atherosclerosis without acute vascular findings.  Reproductive: Hysterectomy. No adnexal mass. Mild pelvic floor laxity. Other: There is diffuse laxity of the anterior abdominal wall with a small periumbilical hernia containing a knuckle of small bowel (image 52/3). No evidence of incarceration or obstruction. Musculoskeletal: No acute or significant osseous findings. Diffuse degenerative changes throughout the thoracolumbar spine associated with a convex left scoliosis. Review of the MIP images confirms the above findings. IMPRESSION: 1. No evidence of acute pulmonary embolism. 2. 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 Central enlargement of the pulmonary arteries consistent with chronic pulmonary arterial hypertension. 3. New superior endplate compression deformity at T4. 4. Stable low-density pleural based mass adjacent to the left lower lobe, likely a benign etiology. 5. Nonobstructing right renal calculi. One of these calculi has moved into the right renal pelvis. No evidence of ureteral calculus or hydronephrosis. 6. Two indeterminate low-density lesions in the right kidney. These could reflect masses or focal inflammation. Correlation with urine analysis recommended. Dedicated renal CT or MRI (without and with contrast) follow up recommended (after antibiotics if clinically warranted). 7. Cholelithiasis, pelvic floor laxity and small periumbilical hernia containing a knuckle of small bowel. 8. Aortic Atherosclerosis (ICD10-I70.0). Electronically Signed   By: Richardean Sale M.D.   On: 08/18/2019 16:39   DG Chest Portable 1 View  Result Date: 09/04/2019 CLINICAL DATA:  Progressive shortness of breath. EXAM: PORTABLE CHEST 1 VIEW COMPARISON:  Chest x-ray dated 04/06/2019 and 03/26/2019 and CT angiogram dated 05/06/2019 FINDINGS: Chronic cardiomegaly. Marked chronic dilatation of the main pulmonary artery with chronic enlargement of the peripheral pulmonary arteries as well. Stable scarring at the left lung base. Slight new atelectasis at the  right lung base. No acute bone abnormality. IMPRESSION: 1. New slight atelectasis at the right lung base. 2. Chronic cardiomegaly. 3. Chronic enlargement of the pulmonary arteries. 4. Stable scarring at the left lung base. Electronically Signed   By: Lorriane Shire M.D.   On: 09/14/2019 12:27    EKG: Independently reviewed.  Sinus rhythm, IVCD, no ischemic ST changes.  Assessment/Plan Active Problems:   Pneumonia    1. SOB-  cta neg. For PE.  Possibly secondary for ?CAP, + volume overloaded  Because of age and multiple comorbidities, she is at high risk for death  Lasix 20mg  iv x1, reassess in am to see if needs more iv or resume home po dose Start iv abx azithro +Rocephin. Incentive spirometer F/u TP/BNP  2. Pancytopenia- chronic. Follows oncology Platelets 29-we will hold DVT anticoagulation prophylaxis and also hold her aspirin for now No obvious bleeding Continue to monitor If continues to drop may need to consider consulting her oncologist  3. Acute on chronic systolic  heart failure/aortic stenosis Mildly volume overloaded Follow-up BMP Lasix 20 mg IV x1 and reassess. Monitor volume status and diuretics closely as she has a.m.  4. Nausea- unclear etiology. No other GI sx.  Possibly 2/2 #1.  CT abd did not reveal cause Monitor zofran    DVT prophylaxis: SCD  Code Status: Spoke to patient personally about CODE STATUS and patient wishes to be DNR. Family Communication: none at bedside.  Disposition Plan: Likely be here 2 midnight stays until she is medically stable to be discharged, however high risk of death.  Consults called: None needed Admission status: Inpatient   Nolberto Hanlon MD [facility] Pager [phone]   If 7PM-7AM, please contact night-coverage [email or web address] Password [facility]  09/15/2019, 5:35 PM

## 2019-09-10 NOTE — ED Provider Notes (Signed)
[facility] Emergency Department Provider Note  ____________________________________________   I have reviewed the triage vital signs and the nursing notes.   HISTORY  Chief Complaint Shortness of Breath   History limited by: Not Limited   HPI Jessica Blackwell is a 85 y.o. female who presents to the emergency department today with main complaint of shortness of breath and nausea. Patient states that the symptoms have been going on for one week. She is on oxygen at baseline. She denies any associated chest pain with her shortness of breath. States she did not try any breathing treatments at home. Denies any associated fevers. She has had some nausea but denies any vomiting. Denies any associated abdominal pain.    Records reviewed. Per medical record review patient has a history of CHF, chronic lung disease.   Past Medical History:  Diagnosis Date  . Aortic stenosis   . Arthritis   . CHF (congestive heart failure) ([place])   . Fall at home 07/28/2015  . GERD (gastroesophageal reflux disease)   . H/O bladder infections   . Hypertension   . Neuropathy    lower extrmities    Patient Active Problem List   Diagnosis Date Noted  . Diabetes mellitus type 2, uncomplicated ([place]) 99991111  . Distal radius fracture 08/19/2019  . Diverticulosis 08/19/2019  . Generalized OA 08/19/2019  . Hyperlipidemia, unspecified 08/19/2019  . Hypothyroidism 08/19/2019  . IBS (irritable bowel syndrome) 08/19/2019  . Mitral valve disease 08/19/2019  . Sinusitis 08/19/2019  . Pancytopenia ([place]) 07/30/2019  . Pressure injury of skin 05/03/2019  . Delirium due to another medical condition 05/02/2019  . Visual hallucinations   . Chronic respiratory failure with hypoxia ([place]) 04/21/2019  . Urinary tract infection due to extended-spectrum beta lactamase (ESBL)-producing Klebsiella 04/21/2019  . UTI (urinary tract infection) 04/06/2019  . CHF (congestive heart failure) ([place])  03/26/2019  . Non-intractable vomiting with nausea   . Duodenitis 02/20/2018  . LOC (loss of consciousness) ([place]) 12/22/2016  . AKI (acute kidney injury) ([place]) 08/01/2015  . Near syncope 08/01/2015  . Ventricular tachycardia ([place]) 08/01/2015  . NSVT (nonsustained ventricular tachycardia) ([place])   . Acute respiratory failure with hypoxia ([place]) 07/28/2015  . Fall at home 07/28/2015  . Essential (primary) hypertension 07/28/2015  . Fall 07/28/2015  . SOB (shortness of breath) on exertion 12/14/2014  . Anemia 03/16/2014  . Chronic systolic CHF (congestive heart failure), NYHA class 3 ([place]) 03/16/2014  . Atrial fibrillation ([place]) 03/16/2014  . Aortic valve stenosis, moderate 03/16/2014  . CAD (coronary artery disease), native coronary artery 03/16/2014  . Gastric outlet obstruction 06/02/2007  . GI bleeding 04/16/2005    Past Surgical History:  Procedure Laterality Date  . ABDOMINAL HYSTERECTOMY    . CATARACT EXTRACTION    . COLON SURGERY    . ESOPHAGOGASTRODUODENOSCOPY N/A 05/22/2018   Procedure: ESOPHAGOGASTRODUODENOSCOPY (EGD);  Surgeon: Virgel Manifold, MD;  Location: [facility] ENDOSCOPY;  Service: Endoscopy;  Laterality: N/A;  . EYE SURGERY    . FOOT SURGERY Right   . ROTATOR CUFF REPAIR Left   . TONSILLECTOMY      Prior to Admission medications   Medication Sig Start Date End Date Taking? Authorizing Provider  acetaminophen (TYLENOL) 325 MG tablet Take 2 tablets (650 mg total) by mouth every 6 (six) hours as needed for mild pain (or Fever >/= 101). 05/26/18   Gouru, Illene Silver, MD  albuterol (VENTOLIN HFA) 108 (90 Base) MCG/ACT inhaler Inhale 2 puffs into the lungs every 6 (  six) hours as needed for wheezing or shortness of breath. Patient not taking: Reported on 08/02/2019 03/28/19   Nicholes Mango, MD  amiodarone (PACERONE) 200 MG tablet Take 0.5 tablets (100 mg total) by mouth daily. 04/09/19   Loletha Grayer, MD  aspirin EC 81 MG tablet Take 81 mg by mouth daily.    [provider]  ferrous sulfate 325 (65 FE) MG tablet Take 325 mg by mouth daily.    [provider]  fexofenadine (ALLEGRA) 60 MG tablet Take 60 mg by mouth daily. Pt daughter said pt takes this as needed, nurse home doing it daily    [provider]  furosemide (LASIX) 20 MG tablet Take 20 mg by mouth 2 (two) times daily.    [provider]  gabapentin (NEURONTIN) 300 MG capsule Take 300 mg by mouth 3 (three) times daily.    [provider]  metoprolol tartrate (LOPRESSOR) 25 MG tablet Take 1 tablet (25 mg total) by mouth 2 (two) times daily. 05/26/18   Gouru, Illene Silver, MD  pantoprazole (PROTONIX) 40 MG tablet Take 1 tablet (40 mg total) by mouth daily. 04/09/19   Loletha Grayer, MD  polyethylene glycol (MIRALAX / GLYCOLAX) 17 g packet Take 17 g by mouth daily as needed for severe constipation. 04/09/19   Loletha Grayer, MD  spironolactone (ALDACTONE) 25 MG tablet Take 12.5 mg by mouth daily.    [provider]    Allergies Ciprofloxacin, Codeine, and Sulfamethoxazole-trimethoprim  Family History  Problem Relation Age of Onset  . Hypertension Father   . CAD Mother     Social History Social History   Tobacco Use  . Smoking status: Never Smoker  . Smokeless tobacco: Never Used  Substance Use Topics  . Alcohol use: No  . Drug use: No    Review of Systems Constitutional: No fever/chills Eyes: No visual changes. ENT: No sore throat. Cardiovascular: Denies chest pain. Respiratory: Positive for shortness of breath. Gastrointestinal: No abdominal pain. Positive for nausea.  Genitourinary: Negative for dysuria. Musculoskeletal: Negative for back pain. Skin: Negative for rash. Neurological: Negative for headaches, focal weakness or numbness.  ____________________________________________   PHYSICAL EXAM:  VITAL SIGNS: ED Triage Vitals  Enc Vitals Group     BP 09/05/2019 1104 (!) 122/52     Pulse Rate 08/27/2019 1104 88     Resp  09/08/2019 1104 (!) 26     Temp 09/12/2019 1104 98.9 F (37.2 C)     Temp Source 08/18/2019 1104 Oral     SpO2 09/08/2019 1103 96 %     Weight 09/05/2019 1106 149 lb 14.6 oz (68 kg)     Height 08/27/2019 1106 5\' 5"  (1.651 m)     Head Circumference --      Peak Flow --      Pain Score 08/28/2019 1106 0   Constitutional: Alert and oriented.  Eyes: Conjunctivae are normal.  ENT      Head: Normocephalic and atraumatic.      Nose: No congestion/rhinnorhea.      Mouth/Throat: Mucous membranes are moist.      Neck: No stridor. Hematological/Lymphatic/Immunilogical: No cervical lymphadenopathy. Cardiovascular: Normal rate, regular rhythm. Positive for systolic murmur.  Respiratory: Normal respiratory effort without tachypnea nor retractions. Breath sounds are clear and equal bilaterally. No wheezes/rales/rhonchi. Gastrointestinal: Soft and non tender. No rebound. No guarding.  Genitourinary: Deferred Musculoskeletal: Normal range of motion in all extremities. No lower extremity edema. Neurologic:  Normal speech and language. No gross focal neurologic  deficits are appreciated.  Skin:  Skin is warm, dry and intact. No rash noted. Psychiatric: Mood and affect are normal. Speech and behavior are normal. Patient exhibits appropriate insight and judgment.  ____________________________________________    LABS (pertinent positives/negatives)  CBC wbc 2.1, hgb 9.2, plt 29 BMP wnl except glu 178, bun 26, ca 8.8 COVID negative ____________________________________________   EKG  I, Nance Pear, attending physician, personally viewed and interpreted this EKG  EKG Time: 1105 Rate: 83 Rhythm: sinus rhythm Axis: left axis deviation Intervals: qtc 496 QRS: IVCD ST changes: no st elevation Impression: abnormal ekg   ____________________________________________    RADIOLOGY  CXR New atelectasis. No acute opacity.    ____________________________________________   PROCEDURES  Procedures  ____________________________________________   INITIAL IMPRESSION / ASSESSMENT AND PLAN / ED COURSE  Pertinent labs & imaging results that were available during my care of the patient were reviewed by me and considered in my medical decision making (see chart for details).   Patient presented to the emergency department today because of concern for shortness of breath. CXR without focal consolidation or opacity. While here the patient did desat on her 3L of oxygen. Rapid COVID negative. Will check ct angio. Anticipate admission.  ____________________________________________   FINAL CLINICAL IMPRESSION(S) / ED DIAGNOSES  Final diagnoses:  Hypoxia  Thrombocythemia ([place])  Shortness of breath     Note: This dictation was prepared with Dragon dictation. Any transcriptional errors that result from this process are unintentional     Nance Pear, MD 09/02/2019 1545

## 2019-09-10 NOTE — ED Triage Notes (Signed)
Arrives from home via EMS>  C/O SOB x 1 week, progressively worsening.  Per EMS report, also abdominal distention.  Initial 3l/[place] sat 85, normally wears 3l/ [place]--currently sats

## 2019-09-10 NOTE — ED Notes (Signed)
ED TO INPATIENT HANDOFF REPORT  ED Nurse Name and Phone #: Willene Hatchet Name/Age/Gender Jessica Blackwell 85 y.o. female Room/Bed: ED01A/ED01A  Code Status   Code Status: DNR  Home/SNF/Other Home Patient oriented to: self, place, time and situation Is this baseline? Yes   Triage Complete: Triage complete  Chief Complaint Pneumonia [J18.9]  Triage Note Arrives from home via EMS>  C/O SOB x 1 week, progressively worsening.  Per EMS report, also abdominal distention.  Initial 3l/[place] sat 85, normally wears 3l/ [place]--currently sats    Allergies Allergies  Allergen Reactions  . Ciprofloxacin Itching and Rash  . Codeine Other (See Comments)    Upset stomach  . Sulfamethoxazole-Trimethoprim Rash    Level of Care/Admitting Diagnosis ED Disposition    ED Disposition Condition [facility] Area: [place] [[id number]]  Level of Care: Med-Surg [16]  Covid Evaluation: Confirmed COVID Negative  Diagnosis: Pneumonia [631497]  Admitting Physician: Nolberto Hanlon [[id number]]  Attending Physician: Nolberto Hanlon [[id number]]  Estimated length of stay: past midnight tomorrow  Certification:: I certify this patient will need inpatient services for at least 2 midnights       B Medical/Surgery History Past Medical History:  Diagnosis Date  . Aortic stenosis   . Arthritis   . CHF (congestive heart failure) ([place])   . Fall at home 07/28/2015  . GERD (gastroesophageal reflux disease)   . H/O bladder infections   . Hypertension   . Neuropathy    lower extrmities   Past Surgical History:  Procedure Laterality Date  . ABDOMINAL HYSTERECTOMY    . CATARACT EXTRACTION    . COLON SURGERY    . ESOPHAGOGASTRODUODENOSCOPY N/A 05/22/2018   Procedure: ESOPHAGOGASTRODUODENOSCOPY (EGD);  Surgeon: Virgel Manifold, MD;  Location: [facility] ENDOSCOPY;  Service: Endoscopy;  Laterality: N/A;  . EYE SURGERY    . FOOT SURGERY Right   . ROTATOR CUFF REPAIR Left   .  TONSILLECTOMY       A IV Location/Drains/Wounds Patient Lines/Drains/Airways Status   Active Line/Drains/Airways    Name:   Placement date:   Placement time:   Site:   Days:   Peripheral IV 09/03/2019 Right Forearm   08/21/2019    1120    Forearm   less than 1   PICC Single Lumen 41/28/78 PICC Right Basilic 37 cm 0 cm   67/67/20    9470    Basilic   962   Pressure Injury 05/02/19 Sacrum Medial;Right;Left Stage II -  Partial thickness loss of dermis presenting as a shallow open ulcer with a red, pink wound bed without slough.   05/02/19    1856     131          Intake/Output Last 24 hours No intake or output data in the 24 hours ending 08/22/2019 1747  Labs/Imaging Results for orders placed or performed during the hospital encounter of 08/29/2019 (from the past 48 hour(s))  Basic metabolic panel     Status: Abnormal   Collection Time: 09/08/2019 11:13 AM  Result Value Ref Range   Sodium 138 135 - 145 mmol/L   Potassium 4.1 3.5 - 5.1 mmol/L   Chloride 100 98 - 111 mmol/L   CO2 27 22 - 32 mmol/L   Glucose, Bld 178 (H) 70 - 99 mg/dL   BUN 26 (H) 8 - 23 mg/dL   Creatinine, Ser 0.75 0.44 - 1.00 mg/dL   Calcium 8.8 (L) 8.9 - 10.3 mg/dL  GFR calc non Af Amer >60 >60 mL/min   GFR calc Af Amer >60 >60 mL/min   Anion gap 11 5 - 15    Comment: Performed at [facility], [address] [place], [place] [address]  CBC     Status: Abnormal   Collection Time: 08/22/2019 11:13 AM  Result Value Ref Range   WBC 2.1 (L) 4.0 - 10.5 K/uL   RBC 3.04 (L) 3.87 - 5.11 MIL/uL   Hemoglobin 9.2 (L) 12.0 - 15.0 g/dL   HCT 28.3 (L) 36.0 - 46.0 %   MCV 93.1 80.0 - 100.0 fL   MCH 30.3 26.0 - 34.0 pg   MCHC 32.5 30.0 - 36.0 g/dL   RDW 15.6 (H) 11.5 - 15.5 %   Platelets 29 (LL) 150 - 400 K/uL    Comment: REPEATED TO VERIFY PLATELET COUNT CONFIRMED BY SMEAR Immature Platelet Fraction may be clinically indicated, consider ordering this additional test [id number] THIS CRITICAL RESULT HAS VERIFIED AND  BEEN CALLED TO Fannie Gathright BY PAULA Juanitta FERREE ON 12 25 2020 AT 1147, AND HAS BEEN READ BACK.     nRBC 1.9 (H) 0.0 - 0.2 %    Comment: Performed at [facility], [address] [place], [place] [address]  POC SARS Coronavirus 2 Ag-ED - Nasal Swab (BD Veritor Kit)     Status: None   Collection Time: 08/31/2019  1:29 PM  Result Value Ref Range   SARS Coronavirus 2 Ag Negative Negative  Respiratory Panel by RT PCR (Flu A&B, Covid) - Nasopharyngeal Swab     Status: None   Collection Time: 08/26/2019  3:04 PM   Specimen: Nasopharyngeal Swab  Result Value Ref Range   SARS Coronavirus 2 by RT PCR NEGATIVE NEGATIVE    Comment: (NOTE) SARS-CoV-2 target nucleic acids are NOT DETECTED. The SARS-CoV-2 RNA is generally detectable in upper respiratoy specimens during the acute phase of infection. The lowest concentration of SARS-CoV-2 viral copies this assay can detect is 131 copies/mL. A negative result does not preclude SARS-Cov-2 infection and should not be used as the sole basis for treatment or other patient management decisions. A negative result may occur with  improper specimen collection/handling, submission of specimen other than nasopharyngeal swab, presence of viral mutation(s) within the areas targeted by this assay, and inadequate number of viral copies (<131 copies/mL). A negative result must be combined with clinical observations, patient history, and epidemiological information. The expected result is Negative. Fact Sheet for Patients:  PinkCheek.be Fact Sheet for Healthcare Providers:  GravelBags.it This test is not yet ap proved or cleared by the [place] FDA and  has been authorized for detection and/or diagnosis of SARS-CoV-2 by FDA under an Emergency Use Authorization (EUA). This EUA will remain  in effect (meaning this test can be used) for the duration of the COVID-19 declaration under Section 564(b)(1)  of the Act, 21 U.S.C. section 360bbb-3(b)(1), unless the authorization is terminated or revoked sooner.    Influenza A by PCR NEGATIVE NEGATIVE   Influenza B by PCR NEGATIVE NEGATIVE    Comment: (NOTE) The Xpert Xpress SARS-CoV-2/FLU/RSV assay is intended as an aid in  the diagnosis of influenza from Nasopharyngeal swab specimens and  should not be used as a sole basis for treatment. Nasal washings and  aspirates are unacceptable for Xpert Xpress SARS-CoV-2/FLU/RSV  testing. Fact Sheet for Patients: PinkCheek.be Fact Sheet for Healthcare Providers: GravelBags.it This test is not yet approved or cleared by the [place] FDA  and  has been authorized for detection and/or diagnosis of SARS-CoV-2 by  FDA under an Emergency Use Authorization (EUA). This EUA will remain  in effect (meaning this test can be used) for the duration of the  Covid-19 declaration under Section 564(b)(1) of the Act, 21  U.S.C. section 360bbb-3(b)(1), unless the authorization is  terminated or revoked. Performed at [facility], [address] [place], [place] [address]    CT Angio Chest PE W and/or Wo Contrast  Result Date: 08/19/2019 CLINICAL DATA:  Progressive shortness of breath for 1 week with abdominal pain and nausea. EXAM: CT ANGIOGRAPHY CHEST CT ABDOMEN AND PELVIS WITH CONTRAST TECHNIQUE: Multidetector CT imaging of the chest was performed using the standard protocol during bolus administration of intravenous contrast. Multiplanar CT image reconstructions and MIPs were obtained to evaluate the vascular anatomy. Multidetector CT imaging of the abdomen and pelvis was performed using the standard protocol during bolus administration of intravenous contrast. CONTRAST:  2m OMNIPAQUE IOHEXOL 350 MG/ML SOLN COMPARISON:  Chest CT 05/06/2019. Abdominopelvic CT 05/20/2018. FINDINGS: CTA CHEST FINDINGS Cardiovascular: The pulmonary arteries are  well opacified with contrast to the level of the subsegmental branches. There is no evidence of acute pulmonary embolism. 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 This is similar to the previous examination. There is central enlargement of the pulmonary arteries. There is atherosclerosis of the aorta, great vessels and coronary arteries. Aortic valvular calcifications are present. There is stable cardiomegaly without pericardial effusion. Mediastinum/Nodes: There are no enlarged mediastinal, hilar or axillary lymph nodes. Stable asymmetric goiter involving the left thyroid lobe. The trachea and esophagus demonstrate no significant findings. Lungs/Pleura: No pleural effusion or pneumothorax. Mild emphysema. Low-density pleural based mass adjacent to the left lower lobe is unchanged, measuring 3.7 x 2.7 cm on image 42/5. There is mildly increased atelectasis at both lung bases. Musculoskeletal/Chest wall: The bones are demineralized. There is a new superior endplate compression deformity involving the T4 vertebral body. No other acute osseous findings. CT ABDOMEN AND PELVIS FINDINGS Hepatobiliary: The liver is normal in density without suspicious focal abnormality. There are multiple small layering gallstones. No gallbladder wall thickening or biliary dilatation. Pancreas: Unremarkable. No pancreatic ductal dilatation or surrounding inflammatory changes. Spleen: Normal in size without focal abnormality. Adrenals/Urinary Tract: Both adrenal glands appear normal. Numerous nonobstructing right renal calculi are again noted. 1 of these calculi has moved into the right renal pelvis, measuring 6 mm on image 38/3. No evidence of ureteral calculus or hydronephrosis. A mildly complex left renal cyst measuring 5.5 cm on image 33/3 is unchanged in size. This demonstrates thin mural calcifications. There are additional simple renal cysts bilaterally. In  addition, there are 2 indeterminate low-density lesions in the right kidney which are ill-defined. In the upper pole, there is a lesion measuring 2.3 x 1.5 cm (image 9/2), and in the lower pole, there is a lesion measuring 2.6 x 12.1 cm on image 16/2. These are indeterminate for masses versus focal inflammation. The bladder appears normal. Stomach/Bowel: No evidence of bowel wall thickening, distention or surrounding inflammatory change. Duodenal diverticulum noted. Probable previous appendectomy. Distal colonic anastomosis appears unchanged. Vascular/Lymphatic: There are no enlarged abdominal or pelvic lymph nodes. Small retroperitoneal lymph nodes are stable. There is aortic and branch vessel atherosclerosis without acute vascular findings. Reproductive: Hysterectomy. No adnexal mass. Mild pelvic floor laxity. Other: There is diffuse laxity of the anterior abdominal wall with a small  periumbilical hernia containing a knuckle of small bowel (image 52/3). No evidence of incarceration or obstruction. Musculoskeletal: No acute or significant osseous findings. Diffuse degenerative changes throughout the thoracolumbar spine associated with a convex left scoliosis. Review of the MIP images confirms the above findings. IMPRESSION: 1. No evidence of acute pulmonary embolism. 2. 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 Central enlargement of the pulmonary arteries consistent with chronic pulmonary arterial hypertension. 3. New superior endplate compression deformity at T4. 4. Stable low-density pleural based mass adjacent to the left lower lobe, likely a benign etiology. 5. Nonobstructing right renal calculi. One of these calculi has moved into the right renal pelvis. No evidence of ureteral calculus or hydronephrosis. 6. Two indeterminate low-density lesions in the right kidney. These could reflect masses or focal inflammation. Correlation with urine analysis recommended. Dedicated renal CT or MRI (without and  with contrast) follow up recommended (after antibiotics if clinically warranted). 7. Cholelithiasis, pelvic floor laxity and small periumbilical hernia containing a knuckle of small bowel. 8. Aortic Atherosclerosis (ICD10-I70.0). Electronically Signed   By: Richardean Sale M.D.   On: 08/28/2019 16:39   CT ABDOMEN PELVIS W CONTRAST  Result Date: 09/16/2019 CLINICAL DATA:  Progressive shortness of breath for 1 week with abdominal pain and nausea. EXAM: CT ANGIOGRAPHY CHEST CT ABDOMEN AND PELVIS WITH CONTRAST TECHNIQUE: Multidetector CT imaging of the chest was performed using the standard protocol during bolus administration of intravenous contrast. Multiplanar CT image reconstructions and MIPs were obtained to evaluate the vascular anatomy. Multidetector CT imaging of the abdomen and pelvis was performed using the standard protocol during bolus administration of intravenous contrast. CONTRAST:  31m OMNIPAQUE IOHEXOL 350 MG/ML SOLN COMPARISON:  Chest CT 05/06/2019. Abdominopelvic CT 05/20/2018. FINDINGS: CTA CHEST FINDINGS Cardiovascular: The pulmonary arteries are well opacified with contrast to the level of the subsegmental branches. There is no evidence of acute pulmonary embolism. 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 This is similar to the previous examination. There is central enlargement of the pulmonary arteries. There is atherosclerosis of the aorta, great vessels and coronary arteries. Aortic valvular calcifications are present. There is stable cardiomegaly without pericardial effusion. Mediastinum/Nodes: There are no enlarged mediastinal, hilar or axillary lymph nodes. Stable asymmetric goiter involving the left thyroid lobe. The trachea and esophagus demonstrate no significant findings. Lungs/Pleura: No pleural effusion or pneumothorax. Mild emphysema. Low-density pleural based mass adjacent to the left lower lobe is unchanged,  measuring 3.7 x 2.7 cm on image 42/5. There is mildly increased atelectasis at both lung bases. Musculoskeletal/Chest wall: The bones are demineralized. There is a new superior endplate compression deformity involving the T4 vertebral body. No other acute osseous findings. CT ABDOMEN AND PELVIS FINDINGS Hepatobiliary: The liver is normal in density without suspicious focal abnormality. There are multiple small layering gallstones. No gallbladder wall thickening or biliary dilatation. Pancreas: Unremarkable. No pancreatic ductal dilatation or surrounding inflammatory changes. Spleen: Normal in size without focal abnormality. Adrenals/Urinary Tract: Both adrenal glands appear normal. Numerous nonobstructing right renal calculi are again noted. 1 of these calculi has moved into the right renal pelvis, measuring 6 mm on image 38/3. No evidence of ureteral calculus or hydronephrosis. A mildly complex left renal cyst measuring 5.5 cm on image 33/3 is unchanged in size. This demonstrates thin mural calcifications. There are additional simple renal cysts bilaterally. In addition, there are 2 indeterminate low-density lesions in the right kidney which are ill-defined.  In the upper pole, there is a lesion measuring 2.3 x 1.5 cm (image 9/2), and in the lower pole, there is a lesion measuring 2.6 x 12.1 cm on image 16/2. These are indeterminate for masses versus focal inflammation. The bladder appears normal. Stomach/Bowel: No evidence of bowel wall thickening, distention or surrounding inflammatory change. Duodenal diverticulum noted. Probable previous appendectomy. Distal colonic anastomosis appears unchanged. Vascular/Lymphatic: There are no enlarged abdominal or pelvic lymph nodes. Small retroperitoneal lymph nodes are stable. There is aortic and branch vessel atherosclerosis without acute vascular findings. Reproductive: Hysterectomy. No adnexal mass. Mild pelvic floor laxity. Other: There is diffuse laxity of the anterior  abdominal wall with a small periumbilical hernia containing a knuckle of small bowel (image 52/3). No evidence of incarceration or obstruction. Musculoskeletal: No acute or significant osseous findings. Diffuse degenerative changes throughout the thoracolumbar spine associated with a convex left scoliosis. Review of the MIP images confirms the above findings. IMPRESSION: 1. No evidence of acute pulmonary embolism. 2. 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 Central enlargement of the pulmonary arteries consistent with chronic pulmonary arterial hypertension. 3. New superior endplate compression deformity at T4. 4. Stable low-density pleural based mass adjacent to the left lower lobe, likely a benign etiology. 5. Nonobstructing right renal calculi. One of these calculi has moved into the right renal pelvis. No evidence of ureteral calculus or hydronephrosis. 6. Two indeterminate low-density lesions in the right kidney. These could reflect masses or focal inflammation. Correlation with urine analysis recommended. Dedicated renal CT or MRI (without and with contrast) follow up recommended (after antibiotics if clinically warranted). 7. Cholelithiasis, pelvic floor laxity and small periumbilical hernia containing a knuckle of small bowel. 8. Aortic Atherosclerosis (ICD10-I70.0). Electronically Signed   By: Richardean Sale M.D.   On: 08/31/2019 16:39   DG Chest Portable 1 View  Result Date: 09/05/2019 CLINICAL DATA:  Progressive shortness of breath. EXAM: PORTABLE CHEST 1 VIEW COMPARISON:  Chest x-ray dated 04/06/2019 and 03/26/2019 and CT angiogram dated 05/06/2019 FINDINGS: Chronic cardiomegaly. Marked chronic dilatation of the main pulmonary artery with chronic enlargement of the peripheral pulmonary arteries as well. Stable scarring at the left lung base. Slight new atelectasis at the right lung base. No acute bone abnormality. IMPRESSION: 1. New slight atelectasis at the right lung base. 2. Chronic  cardiomegaly. 3. Chronic enlargement of the pulmonary arteries. 4. Stable scarring at the left lung base. Electronically Signed   By: Lorriane Shire M.D.   On: 08/20/2019 12:27    Pending Labs Unresulted Labs (From admission, onward)    Start     Ordered   01-Oct-2019 0500  CBC  Tomorrow morning,   STAT     09/13/2019 1734   01-Oct-2019 7106  Basic metabolic panel  Tomorrow morning,   STAT     08/27/2019 1734   08/22/2019 1730  HIV Antibody (routine testing w rflx)  (HIV Antibody (Routine testing w reflex) panel)  Once,   STAT     08/24/2019 1733   09/08/2019 1652  Brain natriuretic peptide  Once,   STAT     08/23/2019 1651   09/03/2019 1121  Urinalysis, Complete w Microscopic  ONCE - STAT,   STAT     09/01/2019 1120          Vitals/Pain Today's Vitals   08/24/2019 1300 09/02/2019 1330 08/19/2019 1400 08/28/2019 1430  BP: (!) 82/72 120/63 (!) 133/54 (!) 118/91  Pulse:  84 89   Resp: (!) 29 (!) 27 (!)  34   Temp:      TempSrc:      SpO2:  95% 100% 90%  Weight:      Height:      PainSc:        Isolation Precautions No active isolations  Medications Medications  acetaminophen (TYLENOL) tablet 650 mg (has no administration in time range)  amiodarone (PACERONE) tablet 100 mg (has no administration in time range)  furosemide (LASIX) tablet 20 mg (has no administration in time range)  spironolactone (ALDACTONE) tablet 12.5 mg (has no administration in time range)  pantoprazole (PROTONIX) EC tablet 40 mg (has no administration in time range)  polyethylene glycol (MIRALAX / GLYCOLAX) packet 17 g (has no administration in time range)  ferrous sulfate tablet 325 mg (has no administration in time range)  gabapentin (NEURONTIN) capsule 300 mg (has no administration in time range)  albuterol (VENTOLIN HFA) 108 (90 Base) MCG/ACT inhaler 2 puff (has no administration in time range)  loratadine (CLARITIN) tablet 10 mg (has no administration in time range)  sodium chloride flush (NS) 0.9 % injection 3 mL (has no  administration in time range)  sodium chloride flush (NS) 0.9 % injection 3 mL (has no administration in time range)  0.9 %  sodium chloride infusion (has no administration in time range)  cefTRIAXone (ROCEPHIN) 2 g in sodium chloride 0.9 % 100 mL IVPB (has no administration in time range)  azithromycin (ZITHROMAX) 500 mg in sodium chloride 0.9 % 250 mL IVPB (has no administration in time range)  metoprolol tartrate (LOPRESSOR) tablet 12.5 mg (has no administration in time range)  sodium chloride 0.9 % bolus 500 mL (0 mLs Intravenous Stopped 08/25/2019 1416)  ipratropium-albuterol (DUONEB) 0.5-2.5 (3) MG/3ML nebulizer solution 3 mL (3 mLs Nebulization Given 09/13/2019 1339)  ipratropium-albuterol (DUONEB) 0.5-2.5 (3) MG/3ML nebulizer solution 3 mL (3 mLs Nebulization Given 09/16/2019 1339)  ipratropium-albuterol (DUONEB) 0.5-2.5 (3) MG/3ML nebulizer solution 3 mL (3 mLs Nebulization Given 08/27/2019 1339)  ondansetron (ZOFRAN) injection 4 mg (4 mg Intravenous Given 08/17/2019 1503)  iohexol (OMNIPAQUE) 350 MG/ML injection 75 mL (75 mLs Intravenous Contrast Given 08/31/2019 1543)    Mobility non-ambulatory Moderate fall risk   Focused Assessments Pulmonary Assessment Handoff:  Lung sounds: L Breath Sounds: Diminished R Breath Sounds: Diminished O2 Device: Nasal Cannula O2 Flow Rate (L/min): 3 L/min      R Recommendations: See Admitting Provider Note  Report given to:   Additional Notes:

## 2019-09-11 DIAGNOSIS — R0602 Shortness of breath: Secondary | ICD-10-CM

## 2019-09-11 LAB — BLOOD GAS, ARTERIAL
Acid-base deficit: 2.4 mmol/L — ABNORMAL HIGH (ref 0.0–2.0)
Acid-base deficit: 2 mmol/L (ref 0.0–2.0)
Bicarbonate: 27.5 mmol/L (ref 20.0–28.0)
Bicarbonate: 23.7 mmol/L (ref 20.0–28.0)
Delivery systems: POSITIVE
Expiratory PAP: 5
FIO2: 1
FIO2: 0.7
Inspiratory PAP: 10
O2 Saturation: 98 %
O2 Saturation: 94.7 %
Patient temperature: 37
Patient temperature: 37
RATE: 20 resp/min
pCO2 arterial: 72 mmHg (ref 32.0–48.0)
pCO2 arterial: 43 mmHg (ref 32.0–48.0)
pH, Arterial: 7.19 — CL (ref 7.350–7.450)
pH, Arterial: 7.35 (ref 7.350–7.450)
pO2, Arterial: 125 mmHg — ABNORMAL HIGH (ref 83.0–108.0)
pO2, Arterial: 78 mmHg — ABNORMAL LOW (ref 83.0–108.0)

## 2019-09-11 LAB — CBC
HCT: 27.5 % — ABNORMAL LOW (ref 36.0–46.0)
Hemoglobin: 8.3 g/dL — ABNORMAL LOW (ref 12.0–15.0)
MCH: 30 pg (ref 26.0–34.0)
MCHC: 30.2 g/dL (ref 30.0–36.0)
MCV: 99.3 fL (ref 80.0–100.0)
Platelets: 23 10*3/uL — CL (ref 150–400)
RBC: 2.77 MIL/uL — ABNORMAL LOW (ref 3.87–5.11)
RDW: 15.7 % — ABNORMAL HIGH (ref 11.5–15.5)
WBC: 0.3 10*3/uL — CL (ref 4.0–10.5)
nRBC: 266.7 % — ABNORMAL HIGH (ref 0.0–0.2)

## 2019-09-11 LAB — COMPREHENSIVE METABOLIC PANEL
ALT: 19 U/L (ref 0–44)
AST: 38 U/L (ref 15–41)
Albumin: 3.2 g/dL — ABNORMAL LOW (ref 3.5–5.0)
Alkaline Phosphatase: 73 U/L (ref 38–126)
Anion gap: 12 (ref 5–15)
BUN: 31 mg/dL — ABNORMAL HIGH (ref 8–23)
CO2: 24 mmol/L (ref 22–32)
Calcium: 8.1 mg/dL — ABNORMAL LOW (ref 8.9–10.3)
Chloride: 105 mmol/L (ref 98–111)
Creatinine, Ser: 1.2 mg/dL — ABNORMAL HIGH (ref 0.44–1.00)
GFR calc Af Amer: 45 mL/min — ABNORMAL LOW (ref >60)
GFR calc non Af Amer: 39 mL/min — ABNORMAL LOW (ref >60)
Glucose, Bld: 123 mg/dL — ABNORMAL HIGH (ref 70–99)
Potassium: 3.9 mmol/L (ref 3.5–5.1)
Sodium: 141 mmol/L (ref 135–145)
Total Bilirubin: 1.6 mg/dL — ABNORMAL HIGH (ref 0.3–1.2)
Total Protein: 6.3 g/dL — ABNORMAL LOW (ref 6.5–8.1)

## 2019-09-11 LAB — GLUCOSE, CAPILLARY
Glucose-Capillary: 193 mg/dL — ABNORMAL HIGH (ref 70–99)
Glucose-Capillary: 123 mg/dL — ABNORMAL HIGH (ref 70–99)

## 2019-09-11 LAB — TROPONIN I (HIGH SENSITIVITY): Troponin I (High Sensitivity): 94 ng/L — ABNORMAL HIGH (ref <18)

## 2019-09-11 LAB — HIV ANTIBODY (ROUTINE TESTING W REFLEX): HIV Screen 4th Generation wRfx: NONREACTIVE

## 2019-09-11 LAB — MAGNESIUM: Magnesium: 1.8 mg/dL (ref 1.7–2.4)

## 2019-09-11 MED ORDER — CHLORHEXIDINE GLUCONATE CLOTH 2 % EX PADS
6.0000 | MEDICATED_PAD | Freq: Every day | CUTANEOUS | Status: DC
  Administered 2019-09-11: 6 via TOPICAL

## 2019-09-11 MED ORDER — SODIUM CHLORIDE 0.9 % IV BOLUS
250.0000 mL | Freq: Once | INTRAVENOUS | Status: AC
  Administered 2019-09-11: 250 mL via INTRAVENOUS

## 2019-09-11 MED ORDER — LACTATED RINGERS IV BOLUS
500.0000 mL | Freq: Once | INTRAVENOUS | Status: AC
  Administered 2019-09-11: 07:00:00 500 mL via INTRAVENOUS

## 2019-09-11 MED ORDER — PROMETHAZINE HCL 25 MG/ML IJ SOLN
6.2500 mg | Freq: Once | INTRAMUSCULAR | Status: AC
  Administered 2019-09-11: 6.25 mg via INTRAVENOUS
  Filled 2019-09-11: qty 1

## 2019-09-12 LAB — GLUCOSE, CAPILLARY: Glucose-Capillary: 217 mg/dL — ABNORMAL HIGH (ref 70–99)

## 2019-09-13 LAB — PATHOLOGIST SMEAR REVIEW

## 2019-09-17 NOTE — Progress Notes (Signed)
   10/04/2019 0900  Clinical Encounter Type  Visited With Patient and family together  Visit Type Death  Referral From Chaplain;Nurse  Spiritual Encounters  Spiritual Needs Prayer  Stress Factors  Family Stress Factors Loss  Pt expired and the family was at the bedside. Ch encouraged the family to share about the pt. Family remembers the pt to be loving, strong, and someone who loves Christmas season. Pt spent Christmas Eve with the family before she came to the hospital. Ch prayed with the family for comfort and peace and the visit was appreciated.

## 2019-09-17 NOTE — Progress Notes (Signed)
PT brady down and went PEA, Dr Gwynneth Albright in room, on phone with family. Pronounced by this RN and Izora Gala, RN at [phone]

## 2019-09-17 NOTE — Progress Notes (Signed)
Patient transferred to ICU following rapid response for bipap. Also has tachycardia and hypotension. NP ordered a 250 ml NS bolus and it was ineffective. Informed NP and she ordered a LR 500 ml bolus to be given.

## 2019-09-17 NOTE — Progress Notes (Signed)
Pt called out asking for medication for nausea and med was given at 0228. Pt report she felt fine but when this nurse walked out the room and looked at the monitor, pt oxygen level was in the 80s. This nurse watched pt oxygen level which was not improving and re-eneter the room and kept adjusting pt oxygen up to 6L [place] with no improvement. This nurse asked another nurse to bring a NRB and pt oxygen was still not satisfactory so a Rapid Response was called at 0242. During the Rapid, pt start declining even further and was getting less responsive. ABG was done on pt and pt was transferred to the critical care unit to be placed on BiPAP. Hospitalist report she would contact the family and update them on pt condition.

## 2019-09-17 NOTE — Progress Notes (Signed)
Nurse reports troponin up to 90, likely demand ischemia with increased heart rate with significant failure history. F/u cardiology consult with attending

## 2019-09-17 NOTE — Progress Notes (Signed)
Patient with history of chronic hypoxic respiratory failure admitted for nausea and shortness of breath who experienced acute hypoxic event with saturation reading in mid 80's shortly after receiving zofran for nausea.  No emesis experienced.  Per RN at bedside oxygenation improved with NRB mask.  Bedside eval patient slightly lethargic with the appearance of slight increased work of breathing. However patient denied she felt short of breath.  ABG on NRB showed respiratory acidosis with CO2 76 pH 7.1, and normal bicarb.  Patient was transferred to ICU to step down bed for bipap.  Daughter informed via phone.   Shortly after arrival to ICU patient again had nausea episode. This was alleviated with 6.25 phenergan and patient resting well.  BP drop 94/60 and HR increase 108 (sinus tachycardia) 250 cc nrmal saline bolus given and patient given lasix earlier with premise fluid overload cause of shortness of breath. However CT chest without evidence of fluid overload and BNP mild elevation unequivocal for patient age

## 2019-09-17 NOTE — Progress Notes (Signed)
Patient resting better without nausea with bipap in place.  However, BP lower with MAP of 59 and 250 cc saline bolus and sinus tach rate 112.  Addiitional 500 ml LR fluid bolus ordered and labs still pending

## 2019-09-17 NOTE — Progress Notes (Addendum)
Ch responded to a RR regarding the pt who is a 55 YOF that is positive for MRSA/ESBL and being assessed by the RRT for possible fluid overload. Pt has a hx of CHF. Ch checked in w/ staff present remaining outside the room for support. F/u recommended at a later time. Pt had hospitalist and nurse at bedside.    10/01/2019 0300  Clinical Encounter Type  Visited With Patient;Health care provider  Visit Type Code  Referral From Nurse  Consult/Referral To Chaplain  Stress Factors  Patient Stress Factors Health changes  Family Stress Factors None identified

## 2019-09-17 NOTE — Death Summary Note (Addendum)
Jessica Blackwell is a 86 y.o. female with medical history significant of pancytopenia with platelet of 29, CHF, aortic stenosis, GERD, hypertension presents in the ED complaining of upset stomach and shortness of breath. She reported being on 2L 02 at home but required more here as her 02 sat dropped to 80's. She appeared volume overloaded. Was treated empirically for pna and also chf . Overnight pt was requiring more 0xygenation.SHe was DNR.  Early this am RRT called based on records. Place on NRB eventually but her 0xygen was still not satisfactory. Pt became less responsive. Pt was transferred to critical care on bipap. BNP was mildly elevated from her previous baseline. This am pt was unresponsive when I saw her, actively dying. HR going down in 30's, unresponsive, on bipap, , bp low sbp 50's, cyanotic, no femoral or distal pulse .  She was pronouced at 08:19 am. Family was notified. Spoken earlier to family that she was not going to make it that we should proceed with comfort care and daughter wanted to talk to other sister and niece. Made a second call to daughter that pt had passed away. She was at high risk of death when admitted due to her advanced age and multiple comorbidities.

## 2019-09-17 DEATH — deceased

## 2019-10-11 IMAGING — CR DG CHEST 1V
1 series · 1 of 1 positions shown · non-contrast
Comparison: 12/22/2016

CLINICAL DATA: Post fall, history CHF, hypertension

EXAM:
CHEST  1 VIEW

[dg chest 1 view]
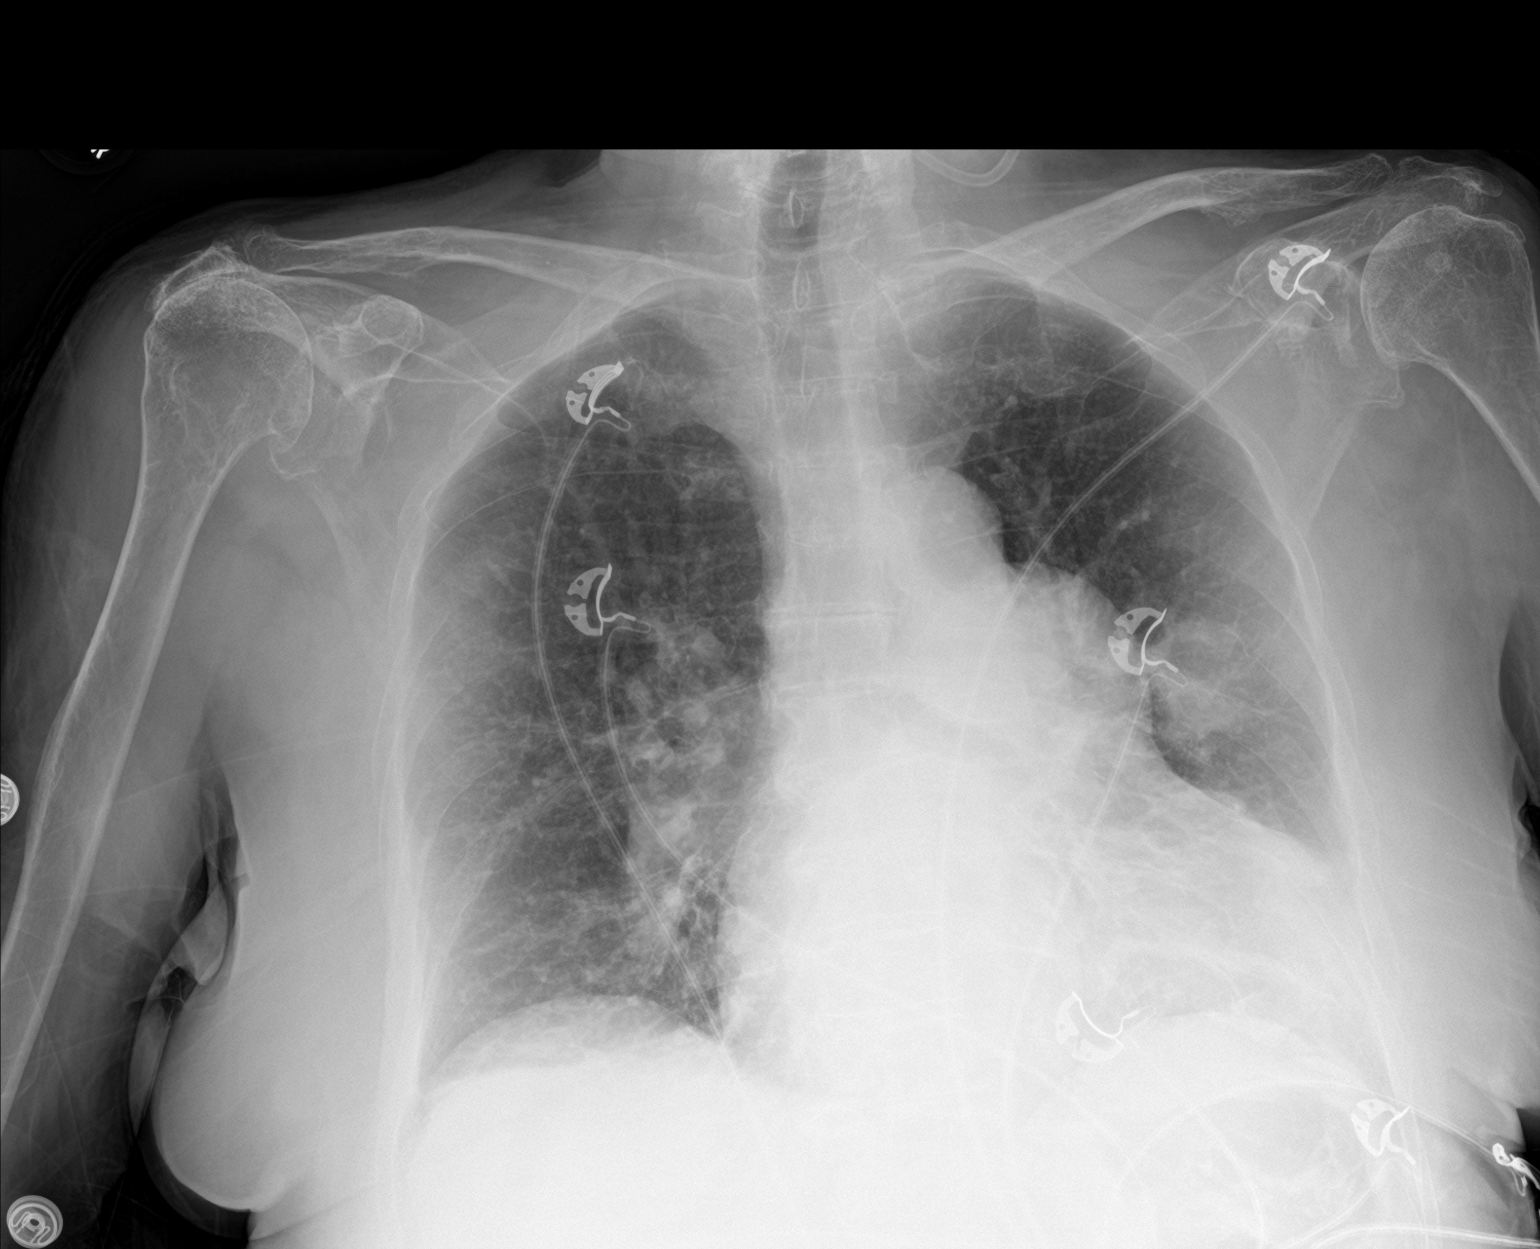

[1 of 1 positions shown; findings below may reference images not displayed]

FINDINGS: Enlargement of cardiac silhouette.

Enlarged central pulmonary arteries seen on previous exam.

However, marked enlargement of the LEFT hilum versus the prior
study, could be related to progressive pulmonary arterial
enlargement or hilar mass/adenopathy.

Bibasilar atelectasis.

Chronic bronchitic changes and accentuation of perihilar markings.

No pleural effusion or pneumothorax.

Known LEFT mid lung mass, shown to be a pleural-based soft tissue
mass on a prior CT, 3.3 cm greatest diameter grossly unchanged.

Osseous demineralization with chronic RIGHT rotator cuff tear.
IMPRESSION: Enlargement of cardiac silhouette.

Known pleural-based LEFT lung mass, grossly stable.

Bronchitic changes with bibasilar atelectasis.

Significant enlargement of the pulmonary hila, increased especially
on LEFT since previous exam, could be due to progressive pulmonary
arterial hypertension although hilar mass/adenopathy not excluded;
if further workup is clinically indicated, this could be best
assessed by CT chest with IV contrast.

## 2019-10-11 IMAGING — CT CT ABD-PELV W/O CM
2 of 4 series · 15 of 46 positions shown, 17 images · non-contrast
Comparison: 02/20/2018 CT abdomen/pelvis.

CLINICAL DATA: Fall.  Blunt abdominal trauma.  Low back pain.

EXAM:
CT ABDOMEN AND PELVIS WITHOUT CONTRAST
TECHNIQUE: Multidetector CT imaging of the abdomen and pelvis was performed
following the standard protocol without IV contrast.

[Series 2: axial st · axial · 0.80mm/px · z∈[-798,-398]mm · 12 of 88 slices shown, 14 images]
[im 4/88  soft-tissue]
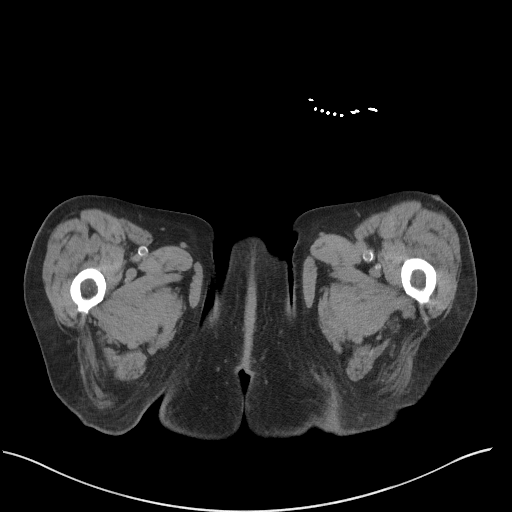
[im 4/88  bone]
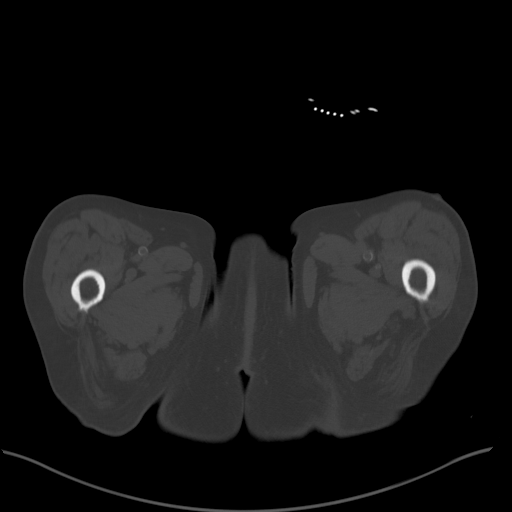
[im 11/88  soft-tissue]
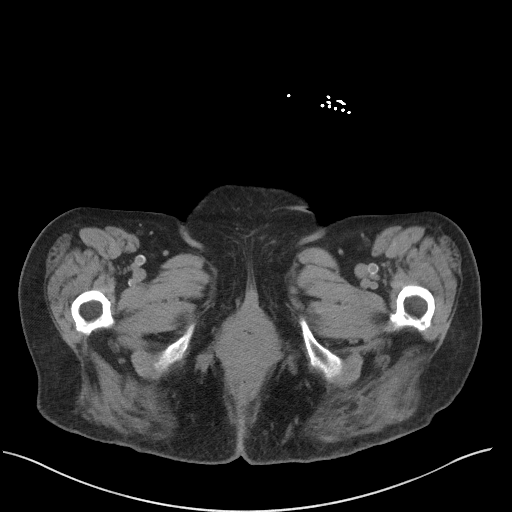
[im 19/88  soft-tissue]
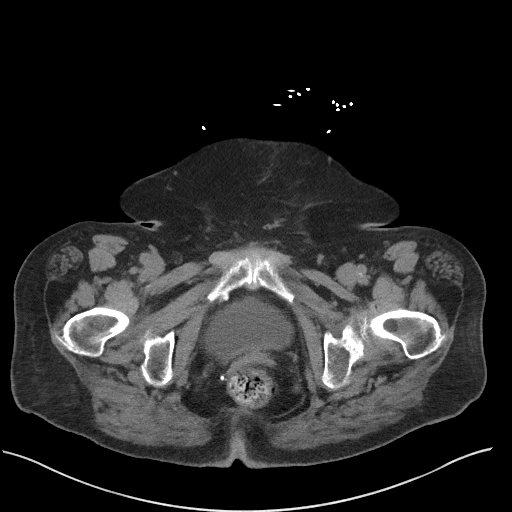
[im 26/88  soft-tissue]
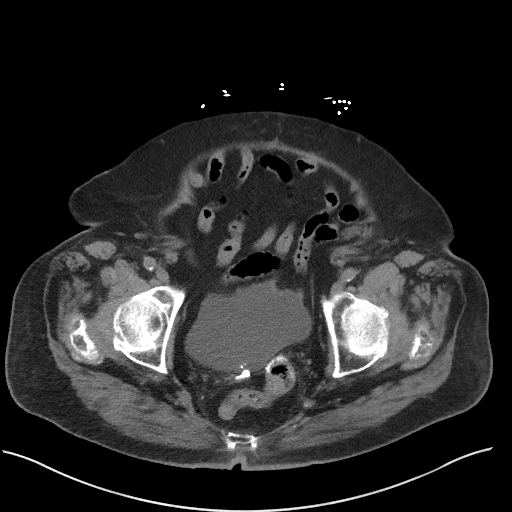
[im 33/88  soft-tissue]
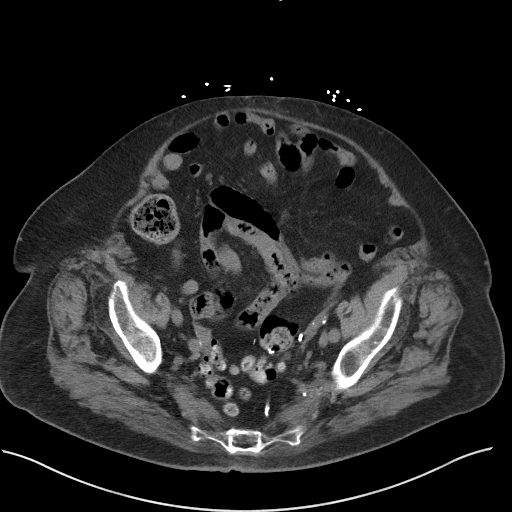
[im 40/88  soft-tissue]
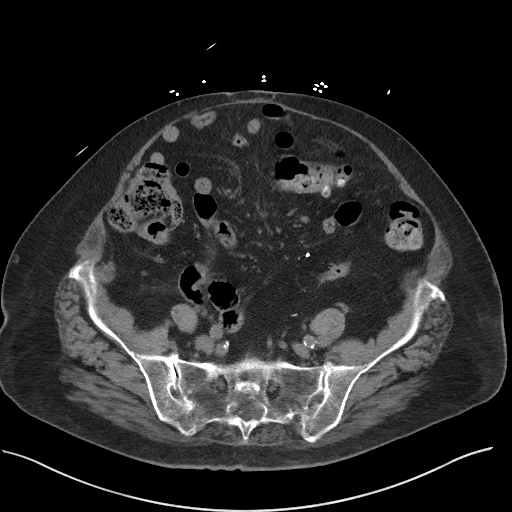
[im 48/88  soft-tissue]
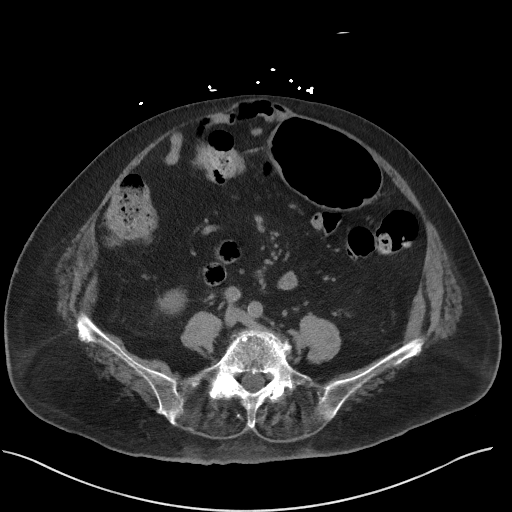
[im 55/88  soft-tissue]
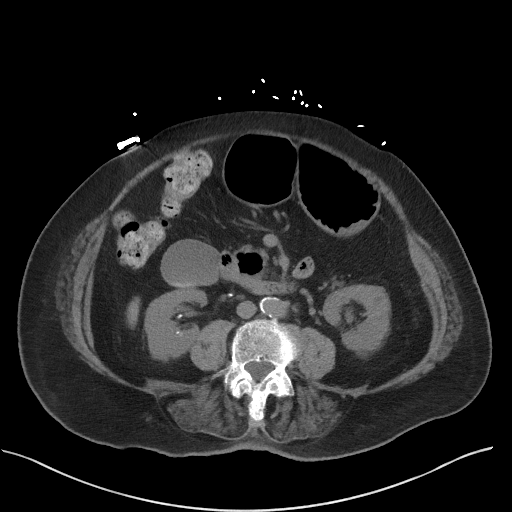
[im 62/88  soft-tissue]
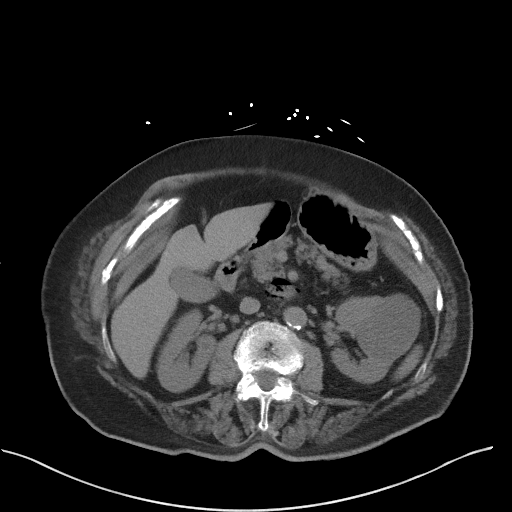
[im 62/88  bone]
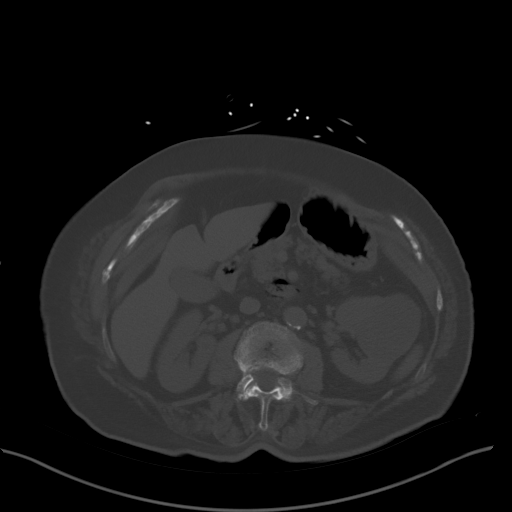
[im 69/88  soft-tissue]
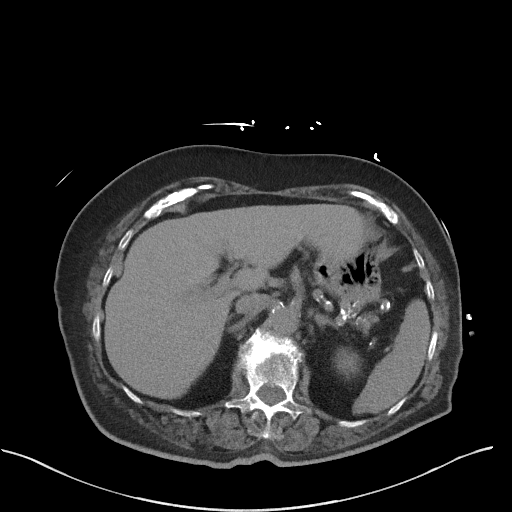
[im 77/88  soft-tissue]
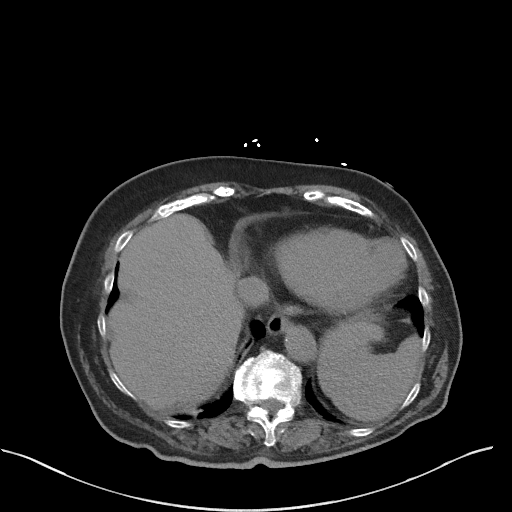
[im 84/88  soft-tissue]
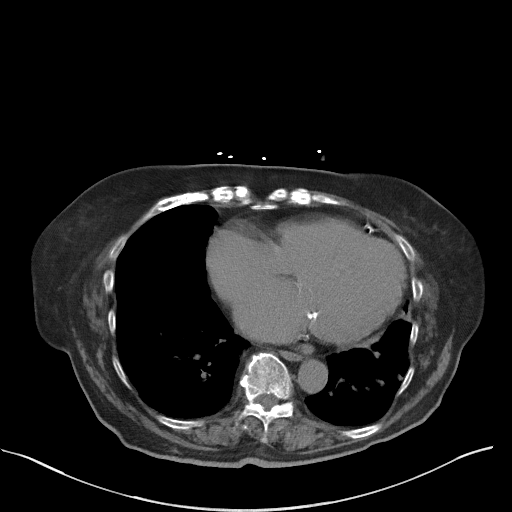

[Series 5: coronal st · coronal · 0.79mm/px · 3 of 103 slices shown]
[im 35/103  soft-tissue]
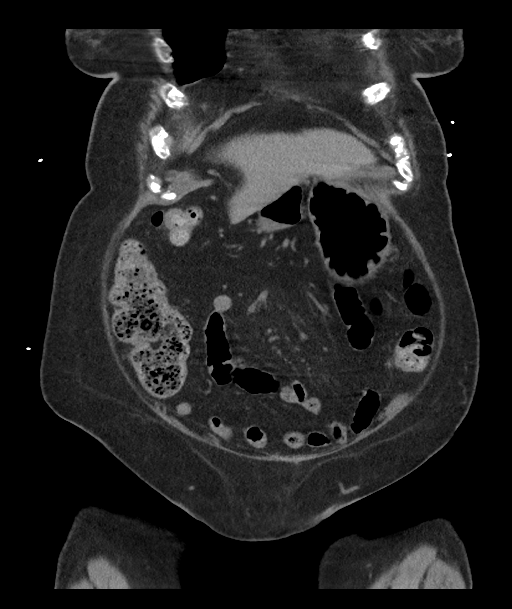
[im 46/103  soft-tissue]
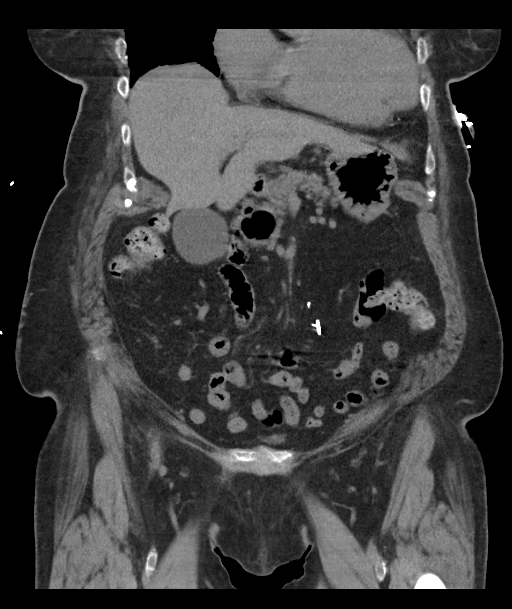
[im 57/103  soft-tissue]
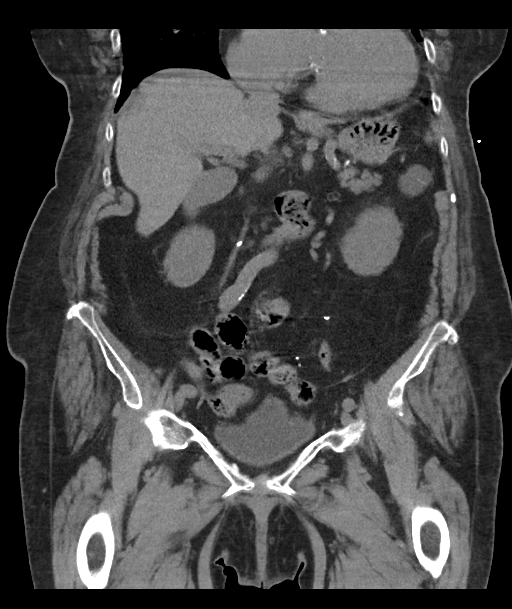

[15 of 46 positions shown; findings below may reference images not displayed]

FINDINGS: Lower chest: Nonspecific scarring versus atelectasis at the
dependent lung bases. Mild cardiomegaly. Coronary atherosclerosis.

Hepatobiliary: Normal liver size. No liver mass. Cholelithiasis. No
gallbladder wall thickening or pericholecystic fluid. No biliary
ductal dilatation. Periampullary duodenal diverticulum.

Pancreas: Normal, with no mass or duct dilation.

Spleen: Normal size. No mass.

Adrenals/Urinary Tract: Normal adrenals. Multiple nonobstructing
lower right renal stones, largest 4 mm. No left renal stones. No
hydronephrosis. Minimally complex 5.6 cm left renal cyst with thin
internal septal calcification, stable since 02/20/2018 CT. Simple
1.2 cm posterior lower left renal cyst. No contour deforming right
renal lesions. Normal caliber ureters, with no ureteral stones.
Normal bladder.

Stomach/Bowel: Normal non-distended stomach. Small periumbilical
ventral abdominal hernia containing small bowel loops, unchanged. No
small bowel dilatation, focal caliber transition, pneumatosis or
wall thickening. Appendix not discretely visualized. No pericecal
inflammatory changes. Stable postsurgical changes in distal colon
with intact appearing distal colonic anastomosis. Marked sigmoid
diverticulosis, with no large bowel wall thickening or significant
acute pericolonic fat stranding.

Vascular/Lymphatic: Atherosclerotic nonaneurysmal abdominal aorta.
No pathologically enlarged lymph nodes in the abdomen or pelvis.

Reproductive: Status post hysterectomy, with no abnormal findings at
the vaginal cuff. No adnexal mass.

Other: No pneumoperitoneum, ascites or focal fluid collection.

Musculoskeletal: No aggressive appearing focal osseous lesions. No
acute fracture. No pelvic diastasis. Marked lumbar spondylosis.
IMPRESSION: 1. No acute traumatic injury in the abdomen or pelvis.
2. No acute abnormality. No evidence of bowel obstruction or acute
bowel inflammation. Stable periumbilical ventral abdominal hernia
containing small bowel loops without acute complication. Stable
postsurgical changes in the distal colon with marked sigmoid
diverticulosis and no evidence of acute diverticulitis.
3. Cholelithiasis. No evidence of acute cholecystitis. No biliary
ductal dilatation.
4. Nonobstructing right nephrolithiasis.
5.  Aortic Atherosclerosis ([id number]).

## 2019-11-26 ENCOUNTER — Other Ambulatory Visit: Payer: Medicare Other

## 2019-11-26 ENCOUNTER — Telehealth: Payer: Medicare Other | Admitting: Oncology
